# Patient Record
Sex: Male | Born: 1980 | Race: White | Hispanic: No | Marital: Single | Smoking: Current every day smoker
Health system: Southern US, Community
[De-identification: ages and names within clinical notes are randomized; demographics above are authoritative.]

## PROBLEM LIST (undated history)

## (undated) DIAGNOSIS — F1990 Other psychoactive substance use, unspecified, uncomplicated: Secondary | ICD-10-CM

## (undated) DIAGNOSIS — B9562 Methicillin resistant Staphylococcus aureus infection as the cause of diseases classified elsewhere: Secondary | ICD-10-CM

## (undated) DIAGNOSIS — M1990 Unspecified osteoarthritis, unspecified site: Secondary | ICD-10-CM

## (undated) DIAGNOSIS — F1910 Other psychoactive substance abuse, uncomplicated: Secondary | ICD-10-CM

---

## 2015-10-05 ENCOUNTER — Emergency Department (HOSPITAL_COMMUNITY): Payer: No Typology Code available for payment source

## 2015-10-05 ENCOUNTER — Emergency Department (HOSPITAL_COMMUNITY): Payer: Self-pay

## 2015-10-05 ENCOUNTER — Encounter (HOSPITAL_COMMUNITY): Payer: Self-pay | Admitting: Emergency Medicine

## 2015-10-05 ENCOUNTER — Emergency Department (HOSPITAL_COMMUNITY)
Admission: EM | Admit: 2015-10-05 | Discharge: 2015-10-05 | Disposition: A | Discharge status: Home / Self Care | Payer: Self-pay | Attending: Emergency Medicine | Admitting: Emergency Medicine

## 2015-10-05 DIAGNOSIS — F17200 Nicotine dependence, unspecified, uncomplicated: Secondary | ICD-10-CM | POA: Insufficient documentation

## 2015-10-05 DIAGNOSIS — S40811A Abrasion of right upper arm, initial encounter: Secondary | ICD-10-CM | POA: Insufficient documentation

## 2015-10-05 DIAGNOSIS — S134XXA Sprain of ligaments of cervical spine, initial encounter: Secondary | ICD-10-CM

## 2015-10-05 DIAGNOSIS — R109 Unspecified abdominal pain: Secondary | ICD-10-CM | POA: Insufficient documentation

## 2015-10-05 DIAGNOSIS — Z8739 Personal history of other diseases of the musculoskeletal system and connective tissue: Secondary | ICD-10-CM | POA: Insufficient documentation

## 2015-10-05 DIAGNOSIS — S30811A Abrasion of abdominal wall, initial encounter: Secondary | ICD-10-CM | POA: Insufficient documentation

## 2015-10-05 DIAGNOSIS — Z23 Encounter for immunization: Secondary | ICD-10-CM | POA: Insufficient documentation

## 2015-10-05 DIAGNOSIS — Z79899 Other long term (current) drug therapy: Secondary | ICD-10-CM | POA: Insufficient documentation

## 2015-10-05 DIAGNOSIS — S29002A Unspecified injury of muscle and tendon of back wall of thorax, initial encounter: Secondary | ICD-10-CM | POA: Insufficient documentation

## 2015-10-05 DIAGNOSIS — Y92410 Unspecified street and highway as the place of occurrence of the external cause: Secondary | ICD-10-CM | POA: Insufficient documentation

## 2015-10-05 DIAGNOSIS — M542 Cervicalgia: Secondary | ICD-10-CM

## 2015-10-05 DIAGNOSIS — Y998 Other external cause status: Secondary | ICD-10-CM | POA: Insufficient documentation

## 2015-10-05 DIAGNOSIS — S40812A Abrasion of left upper arm, initial encounter: Secondary | ICD-10-CM | POA: Insufficient documentation

## 2015-10-05 DIAGNOSIS — T07XXXA Unspecified multiple injuries, initial encounter: Secondary | ICD-10-CM

## 2015-10-05 DIAGNOSIS — Y9389 Activity, other specified: Secondary | ICD-10-CM | POA: Insufficient documentation

## 2015-10-05 DIAGNOSIS — S0101XA Laceration without foreign body of scalp, initial encounter: Secondary | ICD-10-CM

## 2015-10-05 HISTORY — DX: Unspecified osteoarthritis, unspecified site: M19.90

## 2015-10-05 LAB — PROTIME-INR
INR: 1.2 (ref 0.00–1.49)
Prothrombin Time: 15.4 seconds — ABNORMAL HIGH (ref 11.6–15.2)

## 2015-10-05 LAB — SAMPLE TO BLOOD BANK

## 2015-10-05 LAB — COMPREHENSIVE METABOLIC PANEL
ALK PHOS: 76 U/L (ref 38–126)
ALT: 63 U/L (ref 17–63)
ANION GAP: 12 (ref 5–15)
AST: 59 U/L — ABNORMAL HIGH (ref 15–41)
Albumin: 4.2 g/dL (ref 3.5–5.0)
BILIRUBIN TOTAL: 1.2 mg/dL (ref 0.3–1.2)
BUN: 17 mg/dL (ref 6–20)
CALCIUM: 9.4 mg/dL (ref 8.9–10.3)
CO2: 22 mmol/L (ref 22–32)
Chloride: 102 mmol/L (ref 101–111)
Creatinine, Ser: 1.04 mg/dL (ref 0.61–1.24)
GFR calc non Af Amer: >60 mL/min (ref >60)
Glucose, Bld: 116 mg/dL — ABNORMAL HIGH (ref 65–99)
Potassium: 4.1 mmol/L (ref 3.5–5.1)
SODIUM: 136 mmol/L (ref 135–145)
TOTAL PROTEIN: 7.8 g/dL (ref 6.5–8.1)

## 2015-10-05 LAB — I-STAT CHEM 8, ED
BUN: 22 mg/dL — AB (ref 6–20)
CALCIUM ION: 1.06 mmol/L — AB (ref 1.12–1.23)
CHLORIDE: 101 mmol/L (ref 101–111)
Creatinine, Ser: 1 mg/dL (ref 0.61–1.24)
GLUCOSE: 105 mg/dL — AB (ref 65–99)
HCT: 54 % — ABNORMAL HIGH (ref 39.0–52.0)
Hemoglobin: 18.4 g/dL — ABNORMAL HIGH (ref 13.0–17.0)
Potassium: 3.9 mmol/L (ref 3.5–5.1)
Sodium: 137 mmol/L (ref 135–145)
TCO2: 23 mmol/L (ref 0–100)

## 2015-10-05 LAB — CBC
HCT: 47.4 % (ref 39.0–52.0)
Hemoglobin: 16.5 g/dL (ref 13.0–17.0)
MCH: 32 pg (ref 26.0–34.0)
MCHC: 34.8 g/dL (ref 30.0–36.0)
MCV: 91.9 fL (ref 78.0–100.0)
PLATELETS: 170 10*3/uL (ref 150–400)
RBC: 5.16 MIL/uL (ref 4.22–5.81)
RDW: 13.5 % (ref 11.5–15.5)
WBC: 7.7 10*3/uL (ref 4.0–10.5)

## 2015-10-05 LAB — ETHANOL

## 2015-10-05 LAB — CDS SEROLOGY

## 2015-10-05 LAB — I-STAT CG4 LACTIC ACID, ED: Lactic Acid, Venous: 1.78 mmol/L (ref 0.5–2.0)

## 2015-10-05 MED ORDER — SODIUM CHLORIDE 0.9 % IV SOLN
INTRAVENOUS | Status: AC | PRN
  Administered 2015-10-05 (×2): 1000 mL via INTRAVENOUS

## 2015-10-05 MED ORDER — METHOCARBAMOL 1000 MG/10ML IJ SOLN
500.0000 mg | Freq: Once | INTRAVENOUS | Status: AC
  Administered 2015-10-05: 500 mg via INTRAVENOUS
  Filled 2015-10-05: qty 5

## 2015-10-05 MED ORDER — LORAZEPAM 2 MG/ML IJ SOLN
1.0000 mg | Freq: Once | INTRAMUSCULAR | Status: AC
  Administered 2015-10-05: 1 mg via INTRAVENOUS
  Filled 2015-10-05: qty 1

## 2015-10-05 MED ORDER — SODIUM CHLORIDE 0.9 % IV SOLN
INTRAVENOUS | Status: DC
Start: 2015-10-05 — End: 2015-10-06

## 2015-10-05 MED ORDER — FENTANYL CITRATE (PF) 100 MCG/2ML IJ SOLN
INTRAMUSCULAR | Status: AC
  Administered 2015-10-05: 100 ug
  Filled 2015-10-05: qty 2

## 2015-10-05 MED ORDER — LIDOCAINE-EPINEPHRINE 1 %-1:100000 IJ SOLN: 20.0000 mL | Freq: Once | INTRAMUSCULAR | Status: DC

## 2015-10-05 MED ORDER — HYDROMORPHONE HCL 1 MG/ML IJ SOLN
1.0000 mg | INTRAMUSCULAR | Status: DC | PRN
  Administered 2015-10-05 (×2): 1 mg via INTRAVENOUS
  Filled 2015-10-05 (×2): qty 1

## 2015-10-05 MED ORDER — IOPAMIDOL (ISOVUE-370) INJECTION 76%
INTRAVENOUS | Status: AC
  Filled 2015-10-05: qty 100

## 2015-10-05 MED ORDER — ORPHENADRINE CITRATE 30 MG/ML IJ SOLN: 60.0000 mg | Freq: Two times a day (BID) | INTRAMUSCULAR | Status: DC

## 2015-10-05 MED ORDER — HYDROCODONE-ACETAMINOPHEN 5-325 MG PO TABS: 1.0000 | ORAL_TABLET | Freq: Four times a day (QID) | ORAL | Status: DC | PRN

## 2015-10-05 MED ORDER — SODIUM CHLORIDE 0.9 % IV BOLUS (SEPSIS): 1000.0000 mL | Freq: Once | INTRAVENOUS | Status: DC

## 2015-10-05 MED ORDER — HYDROCODONE-ACETAMINOPHEN 5-325 MG PO TABS
2.0000 | ORAL_TABLET | Freq: Once | ORAL | Status: AC
  Administered 2015-10-05: 2 via ORAL
  Filled 2015-10-05: qty 2

## 2015-10-05 MED ORDER — TETANUS-DIPHTHERIA TOXOIDS TD 5-2 LFU IM INJ
0.5000 mL | INJECTION | Freq: Once | INTRAMUSCULAR | Status: AC
  Administered 2015-10-05: 0.5 mL via INTRAMUSCULAR
  Filled 2015-10-05: qty 0.5

## 2015-10-05 MED ORDER — ONDANSETRON HCL 4 MG/2ML IJ SOLN: 4.0000 mg | Freq: Four times a day (QID) | INTRAMUSCULAR | Status: DC | PRN

## 2015-10-05 MED ORDER — FENTANYL CITRATE (PF) 100 MCG/2ML IJ SOLN: 50.0000 ug | Freq: Once | INTRAMUSCULAR | Status: DC

## 2015-10-05 MED ORDER — SODIUM CHLORIDE 0.9 % IV BOLUS (SEPSIS): 500.0000 mL | Freq: Once | INTRAVENOUS | Status: DC

## 2015-10-05 MED ORDER — IBUPROFEN 600 MG PO TABS: 600.0000 mg | ORAL_TABLET | Freq: Three times a day (TID) | ORAL | Status: DC | PRN

## 2015-10-05 MED ORDER — FENTANYL CITRATE (PF) 100 MCG/2ML IJ SOLN
INTRAMUSCULAR | Status: DC | PRN
  Administered 2015-10-05: 50 ug via INTRAVENOUS

## 2015-10-05 MED ORDER — IOPAMIDOL (ISOVUE-370) INJECTION 76%
INTRAVENOUS | Status: AC
  Filled 2015-10-05: qty 50

## 2015-10-05 MED ORDER — METHOCARBAMOL 500 MG PO TABS: 500.0000 mg | ORAL_TABLET | Freq: Three times a day (TID) | ORAL | Status: DC | PRN

## 2015-10-05 NOTE — ED Provider Notes (Signed)
Patient with cervical ligamentous injury on MRI.  Patient is in an [place] collar this time.  He has no weakness in his arms or legs.  He will follow-up with neurosurgery.  Dr. Franky Machoabbell evaluated the patients MRI. :  [place] collar.  Home with [place] collar for showering.  He understands he will need to be in his collar 24-7.  Home with muscle relaxants, anti-inflammatories, short course of narcotic pain medication.  All questions answered.  Azalia BilisKevin Henri Guedes, MD 10/06/15 [phone]

## 2015-10-05 NOTE — ED Notes (Signed)
Patient transported to MRI 
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
## 2015-10-05 NOTE — ED Notes (Signed)
Returned from ct scan 
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
## 2015-10-05 NOTE — ED Notes (Signed)
Received call from MRI regarding patient. Patient currently refusing to lay for MRI. Per MRI patient wants to stand to void. Confirmed with MD Patria Maneampos that patient is okay to stand in current condition.

## 2015-10-05 NOTE — ED Notes (Signed)
Went to MRI to assist patient with standing and ambulating to restroom. Patient able to stand and ambulate without difficulty. Upon encounter patient obviously anxious and exhibiting tremors in arm and legs. Patient described feeling very uncomfortable when placed on MRI stretcher and wasn't sure he could lay for the test duration. [facility] informed. Ativan ordered and given.
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
## 2015-10-08 ENCOUNTER — Encounter (HOSPITAL_BASED_OUTPATIENT_CLINIC_OR_DEPARTMENT_OTHER): Payer: Self-pay | Admitting: Emergency Medicine

## 2015-10-08 ENCOUNTER — Telehealth: Payer: Self-pay | Admitting: Emergency Medicine
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
## 2018-01-16 NOTE — ED Provider Notes (Signed)
 [facility] HEALTH [facility]  ED Provider Note  Patrick [name] 37 y.o. male DOB: [birth year] MRN: [id number] History   Chief Complaint  Patient presents with   Foot Pain    swelling to R foot    Patrick [name] is a 37 y.o. male who presents to the ER with complaint of pain in right foot for the past several days.  Patient denies any injury to the right foot recently.  Pain is worse with standing and with movement.  He is concerned that he may have an infection in his foot as he recently had an infection in his right knee that was treated with antibiotics and improved.  Denies any associated fevers.  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  He has been using heat and Epsom salts soaks without relief of his symptoms.        Past Medical History:  Diagnosis Date   Asthma     History reviewed. No pertinent surgical history.  Social History   Substance and Sexual Activity  Alcohol Use Yes   Social History   Tobacco Use  Smoking Status Current Every Day Smoker   Types: Cigarettes  Smokeless Tobacco Never Used   E-Cigarettes   E-Cigarette Use     Start Date     Cartridges/Day     Quit Date     Social History   Substance and Sexual Activity  Drug Use No   Tetanus up to date?: Unknown Immunizations Up to Date?: Yes   No Known Allergies  Home Medications   ALBUTEROL SULFATE HFA 108 (90 BASE) MCG/ACT INHALER    Inhale into the lungs.   HYDROCODONE -ACETAMINOPHEN  (NORCO) 5-325 MG PER TABLET    Take one tablet by mouth every 8 (eight) hours as needed for Pain.   IBUPROFEN  (ADVIL ,MOTRIN ) 600 MG TABLET    Take 600 mg by mouth.    Review of Systems   Review of Systems  Constitutional: Negative for chills and fever.  Cardiovascular: Negative for leg swelling.  Gastrointestinal: Negative for nausea and vomiting.  Musculoskeletal: Positive for arthralgias. Negative for joint swelling.  Skin:  Negative for rash and wound.  Neurological: Negative for dizziness, light-headedness, numbness and headaches.  Hematological: Negative for adenopathy. Does not bruise/bleed easily.    Physical Exam   ED Triage Vitals [01/16/18 2025]  BP (!) 154/100  Heart Rate 97  Resp 18  SpO2 100 %  Temp 98.9 F (37.2 C)    Physical Exam  Nursing note and vitals reviewed. Constitutional: He appears well-developed and well-nourished. He is in good hygiene. He has good hygiene. He does not appear distressed, does not appear ill and no respiratory distress. Not diaphoretic. HENT:  Head: Normocephalic and atraumatic.  Eyes: Conjunctivae are normal. Right eye: no conjunctival injection. Left eye: no conjunctival injection.  Neck: Normal range of motion. Neck supple.  Cardiovascular: Normal rate, regular rhythm and intact distal pulses.  Pulmonary/Chest: No respiratory distress. Respiratory effort normal.  Musculoskeletal: Normal range of motion. No obvious deformity noted to extremities. no edema. There is mild tenderness to palpation right midfoot.  There is no appreciable swelling, erythema or sign of infectious process.  No obvious sign of injury.  There is no increased warmth.  Neurovascularly intact distally.  No point tenderness at the ankle or proximally in the lower leg.  There is a healing appearing wound to the right knee without sign of infection.  Neurological: He is alert and oriented to person, place, and time.  Skin: Not diaphoretic.  Psychiatric: He has a normal mood and affect. His behavior is normal. Judgment and thought content normal.    ED Course   Lab results:  No data to display  Imaging:   XR FOOT MIN 3 VIEWS RIGHT   Narrative:    INDICATION: Pain TECHNIQUE: XR FOOT MIN 3 VIEWS RIGHT  COMPARISON:  None.      FINDINGS: - No acute fractures.  - No destructive osseous lesions.  - Joint spaces are preserved.  - No focal soft tissue abnormalities.     Impression:     IMPRESSION:  No acute osseous abnormalities.      Electronically Signed by: Valley Lash   ECG: ECG Results   None     Pre-Sedation Procedures  ED Course as of Jan 16 2234  Franky NOVAK [facility] Documentation  Dju Jan 16, 2018  2234 Discussed results of x-ray with patient.  X-ray negative for fracture or other acute process.  Patient has no objective findings of skin infection on exam.  Patient remains convinced that he has a cellulitis of his foot.  Due to persistent request for antibiotic prescription, he will be given prescription for Keflex.  He also be given prescription for diclofenac with instructions to follow with orthopedics for further evaluation and management.     MDM Coding  Provider Communication  New Prescriptions   CEPHALEXIN (KEFLEX) 500 MG CAPSULE    Take one capsule (500 mg dose) by mouth 3 (three) times a day for 7 days.      Quantity: 21 capsule    Refills: 0   DICLOFENAC SODIUM (VOLTAREN) 75 MG EC TABLET    Take one tablet (75 mg dose) by mouth 2 (two) times daily for 10 days.      Quantity: 20 tablet    Refills: 0    Modified Medications   No medications on file    Discontinued Medications   No medications on file    Clinical Impression   Final diagnoses:  Right foot pain  Strain of right foot, initial encounter    ED Disposition    ED Disposition Comment   Discharge               Follow-up Information    Schedule an appointment as soon as possible for a visit  with Zachary DELENA Locus, MD.   Specialty:  Orthopaedic Surgery Contact information: [address] [place] [address] [phone]        [facility] Emergency Department.   Specialty:  Emergency Medicine Comments:  As needed if symptoms worsen Contact information: [address] [place]  [address] [phone]       [facility] In 1 week.   Specialty:  Internal Medicine Contact  information: [address] [place]  [address] [phone]           Electronically signed by:   Franky NOVAK Finder, PA-C 01/16/18 2235

## 2024-06-07 ENCOUNTER — Emergency Department (HOSPITAL_COMMUNITY)

## 2024-06-07 ENCOUNTER — Inpatient Hospital Stay (HOSPITAL_COMMUNITY)
Admission: EM | Admit: 2024-06-07 | Discharge: 2024-07-10 | DRG: 870 | Disposition: E | Attending: Internal Medicine | Admitting: Internal Medicine

## 2024-06-07 ENCOUNTER — Other Ambulatory Visit: Payer: Self-pay

## 2024-06-07 DIAGNOSIS — J80 Acute respiratory distress syndrome: Secondary | ICD-10-CM | POA: Diagnosis present

## 2024-06-07 DIAGNOSIS — Z66 Do not resuscitate: Secondary | ICD-10-CM | POA: Diagnosis not present

## 2024-06-07 DIAGNOSIS — E861 Hypovolemia: Secondary | ICD-10-CM | POA: Diagnosis present

## 2024-06-07 DIAGNOSIS — I110 Hypertensive heart disease with heart failure: Secondary | ICD-10-CM | POA: Diagnosis present

## 2024-06-07 DIAGNOSIS — A523 Neurosyphilis, unspecified: Secondary | ICD-10-CM

## 2024-06-07 DIAGNOSIS — T40601A Poisoning by unspecified narcotics, accidental (unintentional), initial encounter: Secondary | ICD-10-CM

## 2024-06-07 DIAGNOSIS — I63511 Cerebral infarction due to unspecified occlusion or stenosis of right middle cerebral artery: Secondary | ICD-10-CM | POA: Diagnosis not present

## 2024-06-07 DIAGNOSIS — G9341 Metabolic encephalopathy: Secondary | ICD-10-CM | POA: Diagnosis present

## 2024-06-07 DIAGNOSIS — I351 Nonrheumatic aortic (valve) insufficiency: Secondary | ICD-10-CM | POA: Diagnosis present

## 2024-06-07 DIAGNOSIS — R1310 Dysphagia, unspecified: Secondary | ICD-10-CM | POA: Diagnosis present

## 2024-06-07 DIAGNOSIS — L03116 Cellulitis of left lower limb: Secondary | ICD-10-CM | POA: Diagnosis present

## 2024-06-07 DIAGNOSIS — R29707 NIHSS score 7: Secondary | ICD-10-CM

## 2024-06-07 DIAGNOSIS — I330 Acute and subacute infective endocarditis: Secondary | ICD-10-CM | POA: Diagnosis present

## 2024-06-07 DIAGNOSIS — M1990 Unspecified osteoarthritis, unspecified site: Secondary | ICD-10-CM | POA: Diagnosis present

## 2024-06-07 DIAGNOSIS — K5730 Diverticulosis of large intestine without perforation or abscess without bleeding: Secondary | ICD-10-CM | POA: Diagnosis present

## 2024-06-07 DIAGNOSIS — A4102 Sepsis due to Methicillin resistant Staphylococcus aureus: Principal | ICD-10-CM | POA: Diagnosis present

## 2024-06-07 DIAGNOSIS — G8194 Hemiplegia, unspecified affecting left nondominant side: Secondary | ICD-10-CM | POA: Diagnosis present

## 2024-06-07 DIAGNOSIS — F17210 Nicotine dependence, cigarettes, uncomplicated: Secondary | ICD-10-CM | POA: Diagnosis present

## 2024-06-07 DIAGNOSIS — G8929 Other chronic pain: Secondary | ICD-10-CM | POA: Diagnosis present

## 2024-06-07 DIAGNOSIS — I76 Septic arterial embolism: Secondary | ICD-10-CM | POA: Diagnosis present

## 2024-06-07 DIAGNOSIS — K759 Inflammatory liver disease, unspecified: Secondary | ICD-10-CM | POA: Diagnosis present

## 2024-06-07 DIAGNOSIS — R7881 Bacteremia: Secondary | ICD-10-CM

## 2024-06-07 DIAGNOSIS — T40604A Poisoning by unspecified narcotics, undetermined, initial encounter: Secondary | ICD-10-CM

## 2024-06-07 DIAGNOSIS — N179 Acute kidney failure, unspecified: Secondary | ICD-10-CM | POA: Diagnosis present

## 2024-06-07 DIAGNOSIS — E871 Hypo-osmolality and hyponatremia: Secondary | ICD-10-CM | POA: Diagnosis present

## 2024-06-07 DIAGNOSIS — J15212 Pneumonia due to Methicillin resistant Staphylococcus aureus: Secondary | ICD-10-CM | POA: Diagnosis present

## 2024-06-07 DIAGNOSIS — R54 Age-related physical debility: Secondary | ICD-10-CM | POA: Diagnosis present

## 2024-06-07 DIAGNOSIS — Z6821 Body mass index (BMI) 21.0-21.9, adult: Secondary | ICD-10-CM

## 2024-06-07 DIAGNOSIS — C7951 Secondary malignant neoplasm of bone: Secondary | ICD-10-CM | POA: Diagnosis present

## 2024-06-07 DIAGNOSIS — A528 Late syphilis, latent: Secondary | ICD-10-CM | POA: Diagnosis present

## 2024-06-07 DIAGNOSIS — J189 Pneumonia, unspecified organism: Secondary | ICD-10-CM

## 2024-06-07 DIAGNOSIS — Z1152 Encounter for screening for COVID-19: Secondary | ICD-10-CM

## 2024-06-07 DIAGNOSIS — I618 Other nontraumatic intracerebral hemorrhage: Secondary | ICD-10-CM | POA: Diagnosis present

## 2024-06-07 DIAGNOSIS — B9562 Methicillin resistant Staphylococcus aureus infection as the cause of diseases classified elsewhere: Secondary | ICD-10-CM

## 2024-06-07 DIAGNOSIS — R29724 NIHSS score 24: Secondary | ICD-10-CM | POA: Diagnosis not present

## 2024-06-07 DIAGNOSIS — F1123 Opioid dependence with withdrawal: Secondary | ICD-10-CM | POA: Diagnosis present

## 2024-06-07 DIAGNOSIS — R29706 NIHSS score 6: Secondary | ICD-10-CM | POA: Diagnosis present

## 2024-06-07 DIAGNOSIS — Z515 Encounter for palliative care: Secondary | ICD-10-CM

## 2024-06-07 DIAGNOSIS — E43 Unspecified severe protein-calorie malnutrition: Secondary | ICD-10-CM | POA: Insufficient documentation

## 2024-06-07 DIAGNOSIS — R29714 NIHSS score 14: Secondary | ICD-10-CM | POA: Diagnosis not present

## 2024-06-07 DIAGNOSIS — I639 Cerebral infarction, unspecified: Principal | ICD-10-CM

## 2024-06-07 DIAGNOSIS — N261 Atrophy of kidney (terminal): Secondary | ICD-10-CM | POA: Diagnosis present

## 2024-06-07 DIAGNOSIS — R6521 Severe sepsis with septic shock: Secondary | ICD-10-CM | POA: Diagnosis present

## 2024-06-07 DIAGNOSIS — T40411A Poisoning by fentanyl or fentanyl analogs, accidental (unintentional), initial encounter: Secondary | ICD-10-CM | POA: Diagnosis present

## 2024-06-07 DIAGNOSIS — E785 Hyperlipidemia, unspecified: Secondary | ICD-10-CM | POA: Diagnosis present

## 2024-06-07 DIAGNOSIS — A419 Sepsis, unspecified organism: Secondary | ICD-10-CM | POA: Insufficient documentation

## 2024-06-07 DIAGNOSIS — F1590 Other stimulant use, unspecified, uncomplicated: Secondary | ICD-10-CM | POA: Diagnosis present

## 2024-06-07 DIAGNOSIS — E878 Other disorders of electrolyte and fluid balance, not elsewhere classified: Secondary | ICD-10-CM | POA: Diagnosis present

## 2024-06-07 DIAGNOSIS — R29711 NIHSS score 11: Secondary | ICD-10-CM | POA: Diagnosis not present

## 2024-06-07 DIAGNOSIS — I63442 Cerebral infarction due to embolism of left cerebellar artery: Secondary | ICD-10-CM | POA: Diagnosis present

## 2024-06-07 DIAGNOSIS — Z9911 Dependence on respirator [ventilator] status: Secondary | ICD-10-CM

## 2024-06-07 DIAGNOSIS — R29810 Facial weakness: Secondary | ICD-10-CM | POA: Diagnosis present

## 2024-06-07 DIAGNOSIS — I63411 Cerebral infarction due to embolism of right middle cerebral artery: Secondary | ICD-10-CM | POA: Diagnosis present

## 2024-06-07 DIAGNOSIS — J9601 Acute respiratory failure with hypoxia: Secondary | ICD-10-CM

## 2024-06-07 DIAGNOSIS — R7303 Prediabetes: Secondary | ICD-10-CM | POA: Diagnosis present

## 2024-06-07 DIAGNOSIS — G9340 Encephalopathy, unspecified: Secondary | ICD-10-CM | POA: Diagnosis present

## 2024-06-07 HISTORY — DX: Methicillin resistant Staphylococcus aureus infection as the cause of diseases classified elsewhere: B95.62

## 2024-06-07 HISTORY — DX: Other psychoactive substance abuse, uncomplicated: F19.10

## 2024-06-07 HISTORY — DX: Other psychoactive substance use, unspecified, uncomplicated: F19.90

## 2024-06-07 LAB — I-STAT CHEM 8, ED
BUN: 37 mg/dL — ABNORMAL HIGH (ref 6–20)
Calcium, Ion: 1 mmol/L — ABNORMAL LOW (ref 1.15–1.40)
Chloride: 94 mmol/L — ABNORMAL LOW (ref 98–111)
Creatinine, Ser: 1.1 mg/dL (ref 0.61–1.24)
Glucose, Bld: 139 mg/dL — ABNORMAL HIGH (ref 70–99)
HCT: 48 % (ref 39.0–52.0)
Hemoglobin: 16.3 g/dL (ref 13.0–17.0)
Potassium: 4.1 mmol/L (ref 3.5–5.1)
Sodium: 130 mmol/L — ABNORMAL LOW (ref 135–145)
TCO2: 25 mmol/L (ref 22–32)

## 2024-06-07 LAB — I-STAT VENOUS BLOOD GAS, ED
Acid-Base Excess: 3 mmol/L — ABNORMAL HIGH (ref 0.0–2.0)
Bicarbonate: 26.9 mmol/L (ref 20.0–28.0)
Calcium, Ion: 0.99 mmol/L — ABNORMAL LOW (ref 1.15–1.40)
HCT: 46 % (ref 39.0–52.0)
Hemoglobin: 15.6 g/dL (ref 13.0–17.0)
O2 Saturation: 59 %
Potassium: 4.1 mmol/L (ref 3.5–5.1)
Sodium: 129 mmol/L — ABNORMAL LOW (ref 135–145)
TCO2: 28 mmol/L (ref 22–32)
pCO2, Ven: 36.6 mmHg — ABNORMAL LOW (ref 44–60)
pH, Ven: 7.474 — ABNORMAL HIGH (ref 7.25–7.43)
pO2, Ven: 29 mmHg — CL (ref 32–45)

## 2024-06-07 MED ORDER — SODIUM CHLORIDE 0.9% FLUSH: 3.0000 mL | Freq: Once | INTRAVENOUS | Status: DC

## 2024-06-07 MED ORDER — ASPIRIN 325 MG PO TABS
325.0000 mg | ORAL_TABLET | Freq: Once | ORAL | Status: DC
  Filled 2024-06-07: qty 1

## 2024-06-07 MED ORDER — CLOPIDOGREL BISULFATE 75 MG PO TABS
300.0000 mg | ORAL_TABLET | Freq: Once | ORAL | Status: DC
  Filled 2024-06-07: qty 1
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
## 2024-06-07 NOTE — Code Documentation (Signed)
 Stroke Response Nurse Documentation Code Documentation  Patrick [name] is a 43 y.o. male arriving to [facility]  via [facility] on 12/30 with past medical hx of arthritis. On No antithrombotic. Code stroke was activated by EMS.   Patient from home where he was LKW at 0100 and now complaining of left facial droop and left arm weakness. There was concern for substance abuse on EMS arrival, Narcan 1 mg given intranasal per EMS.   Stroke team at the bedside on patient arrival. Labs drawn and patient cleared for CT by Dr. Lorette. Patient to CT with team. NIHSS 6, see documentation for details and code stroke times. Patient with decreased LOC, left facial droop, left arm weakness, and dysarthria  on exam. The following imaging was completed:  CT Head. Patient is not a candidate for IV Thrombolytic due to Outside of window. Patient is not a candidate for IR due to infarcted area noted on CT.   Care Plan: Neuro checks q2 hrs.    Bedside handoff with ED RN Kathyrn.    Griselda Alm ORN  Rapid Response RN

## 2024-06-07 NOTE — ED Provider Notes (Signed)
 " [place] EMERGENCY DEPARTMENT AT [facility] Provider Note   CSN: [id number] Arrival date & time: 06/07/24  2306  An emergency department physician performed an initial assessment on this suspected stroke patient at 2308.  Patient presents with: Code Stroke   Patrick [name] is a 43 y.o. male.   Intoxicated 43 year old male presents ER today secondary to altered mental status.  Full code stroke called prior to arrival.  Last known well was unknown, possibly 0100 tuesday.  Apparently was unresponsive earlier and they were bagging him.  He got Narcan and his responsiveness is improved.  Initial evaluation here he is very slow to follow commands and may have some facial asymmetry.  Vitals were within normal limits.        Prior to Admission medications  Medication Sig Start Date End Date Taking? Authorizing Provider  acetaminophen  (TYLENOL ) 500 MG tablet Take 1,000 mg by mouth every 6 (six) hours as needed for moderate pain.    [provider]  albuterol (PROVENTIL HFA;VENTOLIN HFA) 108 (90 Base) MCG/ACT inhaler Inhale 2 puffs into the lungs every 6 (six) hours as needed for wheezing or shortness of breath.    [provider]  HYDROcodone -acetaminophen  (NORCO/VICODIN) 5-325 MG tablet Take 1 tablet by mouth every 6 (six) hours as needed for moderate pain. 10/05/15   Baxter Drivers, MD  ibuprofen  (ADVIL ,MOTRIN ) 600 MG tablet Take 1 tablet (600 mg total) by mouth every 8 (eight) hours as needed. 10/05/15   Baxter Drivers, MD  methocarbamol  (ROBAXIN ) 500 MG tablet Take 1 tablet (500 mg total) by mouth every 8 (eight) hours as needed for muscle spasms. 10/05/15   Baxter Drivers, MD    Allergies: Patient has no known allergies.    Review of Systems  Updated Vital Signs BP (!) 149/100   Pulse (!) 129   Temp 98.7 F (37.1 C) (Oral)   Resp (!) 23   Ht 5' 11 (1.803 m)   Wt 79.2 kg   SpO2 99%   BMI 24.35 kg/m   Physical Exam Vitals and nursing note reviewed.   Constitutional:      Appearance: He is well-developed.  HENT:     Head: Normocephalic and atraumatic.  Cardiovascular:     Rate and Rhythm: Normal rate.  Pulmonary:     Effort: Pulmonary effort is normal. No respiratory distress.  Abdominal:     General: There is no distension.  Musculoskeletal:        General: Normal range of motion.     Cervical back: Normal range of motion.  Skin:    General: Skin is warm and dry.     Findings: Erythema (Around bilateral ankles) present.  Neurological:     Mental Status: He is alert.     Comments: R facial droop, sleepy, slow to answer questions     (all labs ordered are listed, but only abnormal results are displayed) Labs Reviewed  PROTIME-INR - Abnormal; Notable for the following components:      Result Value   Prothrombin Time 17.1 (*)    INR 1.3 (*)    All other components within normal limits  APTT - Abnormal; Notable for the following components:   aPTT 38 (*)    All other components within normal limits  CBC - Abnormal; Notable for the following components:   WBC 20.1 (*)    Platelets 108 (*)    All other components within normal limits  DIFFERENTIAL - Abnormal; Notable for the following components:  Neutro Abs 16.9 (*)    Monocytes Absolute 1.7 (*)    Abs Immature Granulocytes 0.34 (*)    All other components within normal limits  COMPREHENSIVE METABOLIC PANEL WITH GFR - Abnormal; Notable for the following components:   Sodium 128 (*)    Chloride 91 (*)    Glucose, Bld 143 (*)    BUN 36 (*)    Albumin 3.3 (*)    AST 144 (*)    ALT 71 (*)    Alkaline Phosphatase 181 (*)    All other components within normal limits  URINE DRUG SCREEN - Abnormal; Notable for the following components:   Amphetamines POSITIVE (*)    Fentanyl  POSITIVE (*)    All other components within normal limits  URINALYSIS, W/ REFLEX TO CULTURE (INFECTION SUSPECTED) - Abnormal; Notable for the following components:   Specific Gravity, Urine 1.043  (*)    Hgb urine dipstick SMALL (*)    Protein, ur 100 (*)    All other components within normal limits  I-STAT CHEM 8, ED - Abnormal; Notable for the following components:   Sodium 130 (*)    Chloride 94 (*)    BUN 37 (*)    Glucose, Bld 139 (*)    Calcium, Ion 1.00 (*)    All other components within normal limits  CBG MONITORING, ED - Abnormal; Notable for the following components:   Glucose-Capillary 161 (*)    All other components within normal limits  I-STAT VENOUS BLOOD GAS, ED - Abnormal; Notable for the following components:   pH, Ven 7.474 (*)    pCO2, Ven 36.6 (*)    pO2, Ven 29 (*)    Acid-Base Excess 3.0 (*)    Sodium 129 (*)    Calcium, Ion 0.99 (*)    All other components within normal limits  I-STAT CG4 LACTIC ACID, ED - Abnormal; Notable for the following components:   Lactic Acid, Venous 2.5 (*)    All other components within normal limits  I-STAT CG4 LACTIC ACID, ED - Abnormal; Notable for the following components:   Lactic Acid, Venous 2.6 (*)    All other components within normal limits  CULTURE, BLOOD (ROUTINE X 2)  CULTURE, BLOOD (ROUTINE X 2)  RESP PANEL BY RT-PCR (RSV, FLU A&B, COVID)  RVPGX2  ETHANOL  AMMONIA  GAMMA GT    EKG: None  Radiology: CT CHEST ABDOMEN PELVIS WO CONTRAST Result Date: 06/08/2024 EXAM: CT CHEST, ABDOMEN AND PELVIS WITHOUT CONTRAST 06/08/2024 02:52:24 AM TECHNIQUE: CT of the chest, abdomen and pelvis was performed without the administration of intravenous contrast. Multiplanar reformatted images are provided for review. Automated exposure control, iterative reconstruction, and/or weight based adjustment of the mA/kV was utilized to reduce the radiation dose to as low as reasonably achievable. COMPARISON: Comparison is made to prior examination of 06/26/2020. CLINICAL HISTORY: Sepsis. FINDINGS: CHEST: MEDIASTINUM AND LYMPH NODES: Heart and pericardium are unremarkable. Mild coronary artery calcification. The central airways are  clear. No mediastinal, hilar or axillary lymphadenopathy. LUNGS AND PLEURA: Diffuse bronchial wall thickening noted in keeping with airway inflammation. Right posterior basal pulmonary consolidation suspicious for acute lobar pneumonia in the appropriate clinical setting. Additional pleural based areas of reverse consolidation are identified within the peripheral right upper lobe and right middle lobe which can be seen in the setting of acute infection including atypical infection such as invasive fungal infection of the immunocompromised individual, septic embolization, or resolving pulmonary infarcts. In the acute setting, however, this is  still most likely altogether reflective of multifocal bronchopneumonia. Correlation with the patient's immunocompetency status is warranted, however. Moderate airway impaction noted within the posterior base of segmental bronchi of the right lower lobe. Subpleural pulmonary nodule within the left lower lobe is stable since remote prior examination and is safely considered benign. Additional pulmonary nodules within the right upper lobe measuring up to 5 mm. Noncalcified pulmonary nodule within the left upper lobe (series 67, image 4) is indeterminate. Per Fleischner Society Guidelines recommend a non-contrast chest CT at 3-6 months, then consider another non-contrast chest CT at 18-24 months. If patient is low risk for malignancy, non-contrast chest CT at 18-24 months is optional. No pulmonary edema. No pleural effusion. No pneumothorax. ABDOMEN AND PELVIS: LIVER: Hepatomegaly, liver measuring 23 cm in craniocaudal dimension, appears new from prior examination. GALLBLADDER AND BILE DUCTS: Unremarkable. No biliary ductal dilatation. SPLEEN: Splenomegaly, spleen measuring 50.4 cm in greatest dimension, appears new from prior examination. PANCREAS: No acute abnormality. ADRENAL GLANDS: No acute abnormality. KIDNEYS, URETERS AND BLADDER: Marked atrophy of the right kidney and  compensatory hypertrophy of the left kidney are again noted. Residual contrast from recent contrast enhanced examination is seen within the renal collecting system. The urinary bladder is moderately distended but is otherwise unremarkable. No stones in the kidneys or ureters. No hydronephrosis. No perinephric or periureteral stranding. GI AND BOWEL: Stomach demonstrates no acute abnormality. Moderate sigmoid diverticulosis. No superimposed acute inflammatory change. The small bowel and large bowel are otherwise unremarkable. There is no bowel obstruction. REPRODUCTIVE ORGANS: Moderate right scrotal hydrocele was partially visualized. PERITONEUM AND RETROPERITONEUM: No ascites. No free air. VASCULATURE: Aorta is normal in caliber. Mild aortoiliac atherosclerotic calcification. No aortic aneurysm. ABDOMINAL AND PELVIS LYMPH NODES: No lymphadenopathy. BONES AND SOFT TISSUES: No acute osseous abnormality. No focal soft tissue abnormality. IMPRESSION: 1. Right lower lobe posterior basal consolidation and additional right upper and middle lobe peripheral consolidations, most consistent with multifocal pneumonia. Additional considerations regarding the scattered areas of peripheral consolidation within the right upper and right middle lobe are as described above, and correlation with the patient's immunocompetency status is warranted. 2. Diffuse bronchial wall thickening with moderate mucus impaction in right lower lobe segmental bronchi, consistent with airway inflammation. 3. Hepatosplenomegaly, new from prior examination. 4. Indeterminate noncalcified left upper lobe pulmonary nodule, with recommendation for non-contrast chest CT at 3-6 months and then consideration of non-contrast chest CT at 18-24 months as per Fleischner Society Guidelines. 5. Marked atrophy of the right kidney with compensatory hypertrophy of the left kidney. 6. Moderate sigmoid diverticulosis without acute inflammatory change. 7. Mild coronary  artery calcification and mild aortoiliac atherosclerotic calcification. 8. Partially visualized moderate right scrotal hydrocele. Electronically signed by: Dorethia Molt MD 06/08/2024 03:46 AM EST RP Workstation: [id number]   DG Chest Portable 1 View Result Date: 06/08/2024 EXAM: 1 VIEW(S) XRAY OF THE CHEST 06/07/2024 11:44:39 PM COMPARISON: None available. CLINICAL HISTORY: eval for AMS FINDINGS: LUNGS AND PLEURA: Hypoinflated lungs. Bibasilar opacities, likely atelectasis. No pleural effusion. No pneumothorax. HEART AND MEDIASTINUM: No acute abnormality of the cardiac and mediastinal silhouettes. BONES AND SOFT TISSUES: No acute osseous abnormality. IMPRESSION: 1. Hypoinflated lungs with bibasilar opacities, likely atelectasis. Electronically signed by: Franky Stanford MD 06/08/2024 01:00 AM EST RP Workstation: [id number]   CT ANGIO HEAD NECK W WO CM Result Date: 06/08/2024 EXAM: CTA HEAD AND NECK WITHOUT AND WITH 06/08/2024 12:39:44 AM TECHNIQUE: CTA of the head and neck was performed without and with the administration of 75 mL of intravenous  iohexol (OMNIPAQUE) 350 MG/ML injection. Multiplanar 2D and/or 3D reformatted images are provided for review. Automated exposure control, iterative reconstruction, and/or weight based adjustment of the mA/kV was utilized to reduce the radiation dose to as low as reasonably achievable. Stenosis of the internal carotid arteries measured using NASCET criteria. COMPARISON: None available CLINICAL HISTORY: Neuro deficit, acute, stroke suspected. FINDINGS: CTA NECK: AORTIC ARCH AND ARCH VESSELS: No dissection or arterial injury. No significant stenosis of the brachiocephalic or subclavian arteries. CERVICAL CAROTID ARTERIES: Atherosclerosis at the carotid bifurcations without hemodynamically significant stenosis. No dissection or arterial injury. CERVICAL VERTEBRAL ARTERIES: No dissection, arterial injury, or significant stenosis. LUNGS AND MEDIASTINUM: Unremarkable. SOFT  TISSUES: No acute abnormality. BONES: No acute abnormality. CTA HEAD: ANTERIOR CIRCULATION: No significant stenosis of the internal carotid arteries. No significant stenosis of the anterior cerebral arteries. No significant stenosis of the middle cerebral arteries. No aneurysm. POSTERIOR CIRCULATION: Fetal predominant origins of both posterior cerebral arteries. No significant stenosis of the posterior cerebral arteries. No significant stenosis of the basilar artery. No significant stenosis of the vertebral arteries. No aneurysm. OTHER: No dural venous sinus thrombosis on this non-dedicated study. Unchanged appearance of hypoattenuating ischemic region in the right MCA territory. IMPRESSION: 1. No large vessel occlusion, hemodynamically significant stenosis, or aneurysm in the head or neck. 2. Atherosclerosis at the carotid bifurcations without hemodynamically significant stenosis of the internal carotid arteries. Electronically signed by: Franky Stanford MD 06/08/2024 12:57 AM EST RP Workstation: [id number]   CT HEAD CODE STROKE WO CONTRAST Result Date: 06/07/2024 EXAM: CT HEAD WITHOUT CONTRAST 06/07/2024 11:16:45 PM TECHNIQUE: CT of the head was performed without the administration of intravenous contrast. Automated exposure control, iterative reconstruction, and/or weight based adjustment of the mA/kV was utilized to reduce the radiation dose to as low as reasonably achievable. COMPARISON: None available. CLINICAL HISTORY: Neuro deficit, acute, stroke suspected. FINDINGS: BRAIN AND VENTRICLES: No acute hemorrhage. There is an intermediate-sized area of hypoattenuation within the right MCA territory affecting the insula and m2/m5 cortex. [place] Stroke Program Early CT Score (ASPECTS): Ganglionic (caudate, ic, lentiform nucleus, insula, M1-m3): 5. Supraganglionic (m4-m6): 2. Total: 8. No hydrocephalus. No extra-axial collection. No mass effect or midline shift. ORBITS: No acute abnormality. SINUSES: No acute  abnormality. SOFT TISSUES AND SKULL: No acute soft tissue abnormality. No skull fracture. IMPRESSION: 1. Intermediate-sized area of hypoattenuation within the right MCA territory affecting the insula and M2/M5 cortex, consistent with acute ischemic changes. 2. ASPECTS score is 7. Findings communicated to Dr. Salman Khaliqdina at 11:23 PM on 06/07/2024. Electronically signed by: Franky Stanford MD 06/07/2024 11:24 PM EST RP Workstation: [id number]     .Critical Care  Performed by: Lorette Mayo, MD Authorized by: Lorette Mayo, MD   Critical care provider statement:    Critical care time (minutes):  78   Critical care was necessary to treat or prevent imminent or life-threatening deterioration of the following conditions:  Sepsis, CNS failure or compromise and toxidrome   Critical care was time spent personally by me on the following activities:  Development of treatment plan with patient or surrogate, discussions with consultants, evaluation of patient's response to treatment, examination of patient, ordering and review of laboratory studies, ordering and review of radiographic studies, ordering and performing treatments and interventions, pulse oximetry, re-evaluation of patient's condition and review of old charts    Medications Ordered in the ED  sodium chloride  flush (NS) 0.9 % injection 3 mL (3 mLs Intravenous Not Given 06/07/24 2334)  aspirin tablet 325 mg (0  mg Oral Hold 06/07/24 2358)  clopidogrel (PLAVIX) tablet 300 mg (0 mg Oral Hold 06/07/24 2359)  iohexol (OMNIPAQUE) 350 MG/ML injection 75 mL (75 mLs Intravenous Contrast Given 06/08/24 0032)  sodium chloride  0.9 % bolus 1,000 mL (0 mLs Intravenous Stopped 06/08/24 0316)  vancomycin (VANCOREADY) IVPB 1500 mg/300 mL (0 mg Intravenous Stopped 06/08/24 0354)  ceFEPIme (MAXIPIME) 2 g in sodium chloride  0.9 % 100 mL IVPB (0 g Intravenous Stopped 06/08/24 0148)  sodium chloride  0.9 % bolus 1,000 mL (0 mLs Intravenous Stopped 06/08/24 0516)   naloxone (NARCAN) injection 0.4 mg (0.4 mg Intravenous Given 06/08/24 0335)                                    Medical Decision Making Amount and/or Complexity of Data Reviewed Labs: ordered. Radiology: ordered.  Risk OTC drugs. Prescription drug management. Decision regarding hospitalization.  CT viewed and interpreted by myself as R infarct. Neuro thinks likely subacute. Will await labs and admit to medicine. ASA and Plavix. Patient with a pretty significant leukocytosis so rectal temperature taken and found to be febrile.  Sepsis initiated unknown antibiotics provided.  Patient states he has been coughing but it has been nonproductive.  He has felt unwell but mostly just nausea.  Chest x-ray was overall reassuring.  His labs are otherwise relatively reassuring with a mild bili elevated lactic acid.  CT chest abdomen pelvis performed to evaluate for other areas of sepsis and found to have multifocal pneumonia.  Mental status slowly [place] began to where he was barely moving to voice.  He can see him trying to open his eyes but would not talk to you that he was earlier.  Another dose Narcan was given and he was alert and oriented once again.  Discussed with intensivist, Dr. Layman evaluated patient did not feel like ICU was warranted at this time.  Will discuss with hospitalist for admission to the likely stepdown. Notified by nursing that patient's heart rate and respiratory rate were higher and his blood pressure was low but higher.  This seems to coincide with after he got the Narcan I suspect this is Narcan induced withdrawal rather than actual change in status.  Although he is more awake now he may be able to take his oral Tylenol  as he still may have a temperature we will recheck it rectally. Discussed with Dr. Marcene for admission.   Final diagnoses:  Acute ischemic stroke (HCC)  Opiate overdose, undetermined intent, initial encounter (HCC)  Pneumonia of both lungs due to  infectious organism, unspecified part of lung  Sepsis, due to unspecified organism, unspecified whether acute organ dysfunction present [facility])    ED Discharge Orders     None          Arron Tetrault, Selinda, MD 06/08/24 [id number]  "

## 2024-06-07 NOTE — Consult Note (Signed)
 NEUROLOGY CONSULT NOTE   Date of service: June 07, 2024 Patient Name: Patrick [name] MRN:  [id number] DOB:  Jan 20, 1981 Chief Complaint: L facial droop, L arm weakness, slurred speech Requesting Provider: Lorette Mayo, MD  History of Present Illness  Patrick [name] is a 43 y.o. male with hx of arthritis, who is brought in as a code stroke for L facial droop, L arm weakness.  Friend called 911 due to confusion. When EMS initially arrived, patient was responsive but noted to be weak in L arm with L facial droop and slurred speech. He was activated as a code stroke. There was concern for substance use. Enroute, patient poorly responsive and briefly bagged. He was given 1mg  of Narcan. He is more awake on arrival to the ED.  He is drowsy but able to follow commands and answer questions. Still has persistent L sided weakness.  Despite multiple attempts, pt is unable to give a clear last seen normal. Reports been feeling terrible for the last 5 days. Per EMS, he went to [facility] center last night and was discharged home.  LKW: 0100 on 06/07/24 Modified rankin score: unclear IV Thrombolysis: not offered, outside window EVT: not offered, infarcted area noted on CT head appears as a clear hypodensity and most consistent with irreversibly damaged tissue at this point. He will therefore, not benefit from intervention.  NIHSS components Score: Comment  1a Level of Conscious 0[]  1[x]  2[]  3[]      1b LOC Questions 0[x]  1[]  2[]       1c LOC Commands 0[x]  1[]  2[]       2 Best Gaze 0[x]  1[]  2[]       3 Visual 0[x]  1[]  2[]  3[]      4 Facial Palsy 0[]  1[]  2[x]  3[]      5a Motor Arm - left 0[]  1[x]  2[]  3[]  4[]  UN[]    5b Motor Arm - Right 0[x]  1[]  2[]  3[]  4[]  UN[]    6a Motor Leg - Left 0[]  1[x]  2[]  3[]  4[]  UN[]    6b Motor Leg - Right 0[x]  1[]  2[]  3[]  4[]  UN[]    7 Limb Ataxia 0[x]  1[]  2[]  UN[]      8 Sensory 0[x]  1[]  2[]  UN[]      9 Best Language 0[x]  1[]  2[]  3[]      10 Dysarthria 0[]  1[]   2[x]  UN[]      11 Extinct. and Inattention 0[x]  1[]  2[]       TOTAL: 7      ROS  Comprehensive ROS performed and pertinent positives documented in HPI   Past History   Past Medical History:  Diagnosis Date   Arthritis     No past surgical history on file.  Family History: No family history on file.  Social History  reports that he has been smoking. He does not have any smokeless tobacco history on file. He reports current alcohol use. He reports that he does not use drugs.  Allergies[1]  Medications  Current Medications[2]  Vitals   Vitals:   06/07/24 2300  Weight: 79.2 kg    There is no height or weight on file to calculate BMI.   Physical Exam   General: Laying comfortably in bed; in no acute distress.  HENT: Normal oropharynx and mucosa. Poor dentition. Normal external appearance of ears and nose.  Neck: Supple, no pain or tenderness  CV: No JVD. No peripheral edema.  Pulmonary: Symmetric Chest rise. Normal respiratory effort.  Abdomen: Soft to touch, non-tender.  Ext: No cyanosis, edema, or deformity  Skin: No rash.  Normal palpation of skin.   Musculoskeletal: Normal digits and nails by inspection. No clubbing.   Neurologic Examination  Mental status/Cognition: drowsy/somnolent, oriented to self, place, month and year, good attention.  Speech/language: severely dysarthric speech, barely undesrtandable. Fluent, comprehension intact, object naming intact. Cranial nerves:   CN II Pupils equal and reactive to light, no VF deficits    CN III,IV,VI EOM intact, no gaze preference or deviation, no nystagmus    CN V normal sensation in V1, V2, and V3 segments bilaterally    CN VII L facial droop   CN VIII normal hearing to speech    CN IX & X normal palatal elevation, no uvular deviation    CN XI 5/5 head turn and 5/5 shoulder shrug bilaterally    CN XII midline tongue protrusion    Motor:  Muscle bulk: normal, tone normal. LUE drifts when held up off the  bed. RUE: 5/5 RLE: 4+/5 LLE: slight drift when held up off the bed  Sensation:  Light touch Intact throughout   Pin prick    Temperature    Vibration   Proprioception    Coordination/Complex Motor:  - Finger to Nose intact BL - Heel to shin unable to get him to do - Rapid alternating movement are slowed on the right, unable to do with L hand - Gait: deferred.  Labs/Imaging/Neurodiagnostic studies   CBC: No results for input(s): WBC, NEUTROABS, HGB, HCT, MCV, PLT in the last 168 hours. Basic Metabolic Panel:  Lab Results  Component Value Date   NA 137 10/05/2015   K 3.9 10/05/2015   CO2 22 10/05/2015   GLUCOSE 105 (H) 10/05/2015   BUN 22 (H) 10/05/2015   CREATININE 1.00 10/05/2015   CALCIUM 9.4 10/05/2015   GFRNONAA >60 10/05/2015   GFRAA >60 10/05/2015   Lipid Panel: No results found for: LDLCALC HgbA1c: No results found for: HGBA1C Urine Drug Screen: No results found for: LABOPIA, COCAINSCRNUR, LABBENZ, AMPHETMU, THCU, LABBARB  Alcohol Level     Component Value Date/Time   ETH <5 10/05/2015 1410   INR  Lab Results  Component Value Date   INR 1.20 10/05/2015   APTT No results found for: APTT AED levels: No results found for: PHENYTOIN, ZONISAMIDE, LAMOTRIGINE, LEVETIRACETA  CT Head without contrast(Personally reviewed): Aute R MCA stroke with ASPECTS of 7.  CT angio Head and Neck with contrast(Personally reviewed): Pending  MRI Brain(Personally reviewed): pending ASSESSMENT   Patrick [name] is a 43 y.o. male who is brought in as a code stroke for L facial droop, L arm weakness. There is concern for substance use. Patient poorly responsive enroute and being bagged but woke up with Narcan 1mg .  Neuro exam with drowsiness, L facial droop and L arm weakness and L leg weakness.  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 He was outside tnkase window and not offered thrombectomy due to infarcted tissue appearance  on CT being suggestive of irreversibly damaged tissue.  RECOMMENDATIONS  - Frequent Neuro checks per stroke unit protocol - Recommend brain imaging with MRI Brain without contrast - Recommend Vascular imaging with CTA head and neck - Recommend obtaining TTE - Recommend obtaining Lipid panel with LDL - Please start statin if LDL > 70 - Recommend HbA1c to evaluate for diabetes and how well it is controlled. - Antithrombotic - Aspirin 325mg  once, along with plavix 300mg  once, followed by Aspirin 81mg  daily and plavix 75mg  daily x 21 days, followed by Aspirin 81mg  daily alone. -  Recommend DVT ppx - SBP goal - permissive hypertension first 24 h < 220/110. Held home meds.  - Recommend Telemetry monitoring for arrythmia - Recommend bedside swallow screen prior to PO intake. - Stroke education booklet - Recommend PT/OT/SLP consult - Recommend Urine Tox screen.  ______________________________________________________________________  Plan discussed with Dr. Lorette with the ED team.  Signed, Nannette Zill, MD [facility]     [1] No Known Allergies [2]  Current Facility-Administered Medications:    sodium chloride  flush (NS) 0.9 % injection 3 mL, 3 mL, Intravenous, Once, Mesner, Selinda, MD  Current Outpatient Medications:    acetaminophen  (TYLENOL ) 500 MG tablet, Take 1,000 mg by mouth every 6 (six) hours as needed for moderate pain., Disp: , Rfl:    albuterol (PROVENTIL HFA;VENTOLIN HFA) 108 (90 Base) MCG/ACT inhaler, Inhale 2 puffs into the lungs every 6 (six) hours as needed for wheezing or shortness of breath., Disp: , Rfl:    HYDROcodone -acetaminophen  (NORCO/VICODIN) 5-325 MG tablet, Take 1 tablet by mouth every 6 (six) hours as needed for moderate pain., Disp: 25 tablet, Rfl: 0   ibuprofen  (ADVIL ,MOTRIN ) 600 MG tablet, Take 1 tablet (600 mg total) by mouth every 8 (eight) hours as needed., Disp: 15 tablet, Rfl: 0   methocarbamol  (ROBAXIN ) 500 MG tablet, Take 1 tablet  (500 mg total) by mouth every 8 (eight) hours as needed for muscle spasms., Disp: 12 tablet, Rfl: 0

## 2024-06-08 ENCOUNTER — Emergency Department (HOSPITAL_COMMUNITY)

## 2024-06-08 ENCOUNTER — Inpatient Hospital Stay (HOSPITAL_COMMUNITY)

## 2024-06-08 DIAGNOSIS — C7951 Secondary malignant neoplasm of bone: Secondary | ICD-10-CM | POA: Diagnosis present

## 2024-06-08 DIAGNOSIS — F1110 Opioid abuse, uncomplicated: Secondary | ICD-10-CM

## 2024-06-08 DIAGNOSIS — I330 Acute and subacute infective endocarditis: Secondary | ICD-10-CM | POA: Diagnosis present

## 2024-06-08 DIAGNOSIS — I76 Septic arterial embolism: Secondary | ICD-10-CM | POA: Diagnosis present

## 2024-06-08 DIAGNOSIS — J189 Pneumonia, unspecified organism: Secondary | ICD-10-CM | POA: Diagnosis not present

## 2024-06-08 DIAGNOSIS — F1911 Other psychoactive substance abuse, in remission: Secondary | ICD-10-CM | POA: Diagnosis not present

## 2024-06-08 DIAGNOSIS — A4102 Sepsis due to Methicillin resistant Staphylococcus aureus: Secondary | ICD-10-CM | POA: Diagnosis present

## 2024-06-08 DIAGNOSIS — R29724 NIHSS score 24: Secondary | ICD-10-CM | POA: Diagnosis not present

## 2024-06-08 DIAGNOSIS — J80 Acute respiratory distress syndrome: Secondary | ICD-10-CM | POA: Diagnosis present

## 2024-06-08 DIAGNOSIS — R29711 NIHSS score 11: Secondary | ICD-10-CM | POA: Diagnosis not present

## 2024-06-08 DIAGNOSIS — I618 Other nontraumatic intracerebral hemorrhage: Secondary | ICD-10-CM | POA: Diagnosis present

## 2024-06-08 DIAGNOSIS — I340 Nonrheumatic mitral (valve) insufficiency: Secondary | ICD-10-CM | POA: Diagnosis not present

## 2024-06-08 DIAGNOSIS — R7303 Prediabetes: Secondary | ICD-10-CM | POA: Diagnosis not present

## 2024-06-08 DIAGNOSIS — F1991 Other psychoactive substance use, unspecified, in remission: Secondary | ICD-10-CM | POA: Diagnosis not present

## 2024-06-08 DIAGNOSIS — R29706 NIHSS score 6: Secondary | ICD-10-CM | POA: Diagnosis present

## 2024-06-08 DIAGNOSIS — I38 Endocarditis, valve unspecified: Secondary | ICD-10-CM | POA: Diagnosis not present

## 2024-06-08 DIAGNOSIS — I358 Other nonrheumatic aortic valve disorders: Secondary | ICD-10-CM | POA: Diagnosis not present

## 2024-06-08 DIAGNOSIS — I639 Cerebral infarction, unspecified: Secondary | ICD-10-CM | POA: Diagnosis present

## 2024-06-08 DIAGNOSIS — I63442 Cerebral infarction due to embolism of left cerebellar artery: Secondary | ICD-10-CM | POA: Diagnosis present

## 2024-06-08 DIAGNOSIS — G9340 Encephalopathy, unspecified: Secondary | ICD-10-CM | POA: Diagnosis not present

## 2024-06-08 DIAGNOSIS — R7881 Bacteremia: Secondary | ICD-10-CM | POA: Diagnosis not present

## 2024-06-08 DIAGNOSIS — F1510 Other stimulant abuse, uncomplicated: Secondary | ICD-10-CM | POA: Diagnosis not present

## 2024-06-08 DIAGNOSIS — G9341 Metabolic encephalopathy: Secondary | ICD-10-CM | POA: Diagnosis present

## 2024-06-08 DIAGNOSIS — R1310 Dysphagia, unspecified: Secondary | ICD-10-CM | POA: Diagnosis present

## 2024-06-08 DIAGNOSIS — R570 Cardiogenic shock: Secondary | ICD-10-CM | POA: Diagnosis not present

## 2024-06-08 DIAGNOSIS — J15212 Pneumonia due to Methicillin resistant Staphylococcus aureus: Secondary | ICD-10-CM | POA: Diagnosis present

## 2024-06-08 DIAGNOSIS — R7989 Other specified abnormal findings of blood chemistry: Secondary | ICD-10-CM | POA: Diagnosis not present

## 2024-06-08 DIAGNOSIS — I69391 Dysphagia following cerebral infarction: Secondary | ICD-10-CM | POA: Diagnosis not present

## 2024-06-08 DIAGNOSIS — I10 Essential (primary) hypertension: Secondary | ICD-10-CM | POA: Diagnosis not present

## 2024-06-08 DIAGNOSIS — Z66 Do not resuscitate: Secondary | ICD-10-CM | POA: Diagnosis not present

## 2024-06-08 DIAGNOSIS — Z1152 Encounter for screening for COVID-19: Secondary | ICD-10-CM | POA: Diagnosis not present

## 2024-06-08 DIAGNOSIS — I351 Nonrheumatic aortic (valve) insufficiency: Secondary | ICD-10-CM | POA: Diagnosis not present

## 2024-06-08 DIAGNOSIS — I63411 Cerebral infarction due to embolism of right middle cerebral artery: Secondary | ICD-10-CM | POA: Diagnosis present

## 2024-06-08 DIAGNOSIS — J9601 Acute respiratory failure with hypoxia: Secondary | ICD-10-CM | POA: Diagnosis not present

## 2024-06-08 DIAGNOSIS — N179 Acute kidney failure, unspecified: Secondary | ICD-10-CM | POA: Diagnosis present

## 2024-06-08 DIAGNOSIS — Z515 Encounter for palliative care: Secondary | ICD-10-CM | POA: Diagnosis not present

## 2024-06-08 DIAGNOSIS — R578 Other shock: Secondary | ICD-10-CM | POA: Diagnosis not present

## 2024-06-08 DIAGNOSIS — I6389 Other cerebral infarction: Secondary | ICD-10-CM | POA: Diagnosis not present

## 2024-06-08 DIAGNOSIS — F1123 Opioid dependence with withdrawal: Secondary | ICD-10-CM | POA: Diagnosis present

## 2024-06-08 DIAGNOSIS — I6381 Other cerebral infarction due to occlusion or stenosis of small artery: Secondary | ICD-10-CM | POA: Diagnosis not present

## 2024-06-08 DIAGNOSIS — Z9911 Dependence on respirator [ventilator] status: Secondary | ICD-10-CM | POA: Diagnosis not present

## 2024-06-08 DIAGNOSIS — T40604A Poisoning by unspecified narcotics, undetermined, initial encounter: Secondary | ICD-10-CM | POA: Diagnosis not present

## 2024-06-08 DIAGNOSIS — L03116 Cellulitis of left lower limb: Secondary | ICD-10-CM | POA: Diagnosis present

## 2024-06-08 DIAGNOSIS — B9562 Methicillin resistant Staphylococcus aureus infection as the cause of diseases classified elsewhere: Secondary | ICD-10-CM

## 2024-06-08 DIAGNOSIS — R29710 NIHSS score 10: Secondary | ICD-10-CM | POA: Diagnosis not present

## 2024-06-08 DIAGNOSIS — I361 Nonrheumatic tricuspid (valve) insufficiency: Secondary | ICD-10-CM | POA: Diagnosis not present

## 2024-06-08 DIAGNOSIS — A539 Syphilis, unspecified: Secondary | ICD-10-CM | POA: Diagnosis not present

## 2024-06-08 DIAGNOSIS — R29714 NIHSS score 14: Secondary | ICD-10-CM | POA: Diagnosis not present

## 2024-06-08 DIAGNOSIS — F17210 Nicotine dependence, cigarettes, uncomplicated: Secondary | ICD-10-CM | POA: Diagnosis not present

## 2024-06-08 DIAGNOSIS — E43 Unspecified severe protein-calorie malnutrition: Secondary | ICD-10-CM | POA: Diagnosis present

## 2024-06-08 DIAGNOSIS — A523 Neurosyphilis, unspecified: Secondary | ICD-10-CM | POA: Diagnosis not present

## 2024-06-08 DIAGNOSIS — A419 Sepsis, unspecified organism: Secondary | ICD-10-CM | POA: Diagnosis not present

## 2024-06-08 DIAGNOSIS — R6521 Severe sepsis with septic shock: Secondary | ICD-10-CM | POA: Diagnosis present

## 2024-06-08 LAB — HEMOGLOBIN A1C
Hgb A1c MFr Bld: 6 % — ABNORMAL HIGH (ref 4.8–5.6)
Mean Plasma Glucose: 125.5 mg/dL

## 2024-06-08 LAB — ECHOCARDIOGRAM COMPLETE
AR max vel: 3.23 cm2
AV Area VTI: 3.98 cm2
AV Area mean vel: 3.1 cm2
AV Mean grad: 4 mmHg
AV Peak grad: 6.3 mmHg
Ao pk vel: 1.25 m/s
Height: 71 in
S' Lateral: 3.5 cm
Weight: 2793.67 [oz_av]

## 2024-06-08 LAB — DIFFERENTIAL
Abs Immature Granulocytes: 0.34 K/uL — ABNORMAL HIGH (ref 0.00–0.07)
Basophils Absolute: 0.1 K/uL (ref 0.0–0.1)
Basophils Relative: 0 %
Eosinophils Absolute: 0 K/uL (ref 0.0–0.5)
Eosinophils Relative: 0 %
Immature Granulocytes: 2 %
Lymphocytes Relative: 5 %
Lymphs Abs: 1 K/uL (ref 0.7–4.0)
Monocytes Absolute: 1.7 K/uL — ABNORMAL HIGH (ref 0.1–1.0)
Monocytes Relative: 9 %
Neutro Abs: 16.9 K/uL — ABNORMAL HIGH (ref 1.7–7.7)
Neutrophils Relative %: 84 %
Smear Review: NORMAL

## 2024-06-08 LAB — URINALYSIS, W/ REFLEX TO CULTURE (INFECTION SUSPECTED)
Bacteria, UA: NONE SEEN
Bilirubin Urine: NEGATIVE
Glucose, UA: NEGATIVE mg/dL
Ketones, ur: NEGATIVE mg/dL
Leukocytes,Ua: NEGATIVE
Nitrite: NEGATIVE
Protein, ur: 100 mg/dL — AB
Specific Gravity, Urine: 1.043 — ABNORMAL HIGH (ref 1.005–1.030)
pH: 6 (ref 5.0–8.0)

## 2024-06-08 LAB — BLOOD CULTURE ID PANEL (REFLEXED) - BCID2

## 2024-06-08 LAB — COMPREHENSIVE METABOLIC PANEL WITH GFR
ALT: 71 U/L — ABNORMAL HIGH (ref 0–44)
AST: 144 U/L — ABNORMAL HIGH (ref 15–41)
Albumin: 3.3 g/dL — ABNORMAL LOW (ref 3.5–5.0)
Alkaline Phosphatase: 181 U/L — ABNORMAL HIGH (ref 38–126)
Anion gap: 12 (ref 5–15)
BUN: 36 mg/dL — ABNORMAL HIGH (ref 6–20)
CO2: 25 mmol/L (ref 22–32)
Calcium: 9 mg/dL (ref 8.9–10.3)
Chloride: 91 mmol/L — ABNORMAL LOW (ref 98–111)
Creatinine, Ser: 1 mg/dL (ref 0.61–1.24)
GFR, Estimated: >60 mL/min
Glucose, Bld: 143 mg/dL — ABNORMAL HIGH (ref 70–99)
Potassium: 4.5 mmol/L (ref 3.5–5.1)
Sodium: 128 mmol/L — ABNORMAL LOW (ref 135–145)
Total Bilirubin: 0.9 mg/dL (ref 0.0–1.2)
Total Protein: 8.1 g/dL (ref 6.5–8.1)

## 2024-06-08 LAB — CBC
HCT: 43.4 % (ref 39.0–52.0)
Hemoglobin: 15.1 g/dL (ref 13.0–17.0)
MCH: 29.4 pg (ref 26.0–34.0)
MCHC: 34.8 g/dL (ref 30.0–36.0)
MCV: 84.4 fL (ref 80.0–100.0)
Platelets: 108 K/uL — ABNORMAL LOW (ref 150–400)
RBC: 5.14 MIL/uL (ref 4.22–5.81)
RDW: 13.7 % (ref 11.5–15.5)
WBC: 20.1 K/uL — ABNORMAL HIGH (ref 4.0–10.5)
nRBC: 0 % (ref 0.0–0.2)

## 2024-06-08 LAB — URINE DRUG SCREEN
Amphetamines: POSITIVE — AB
Barbiturates: NEGATIVE
Benzodiazepines: NEGATIVE
Cocaine: NEGATIVE
Fentanyl: POSITIVE — AB
Methadone Scn, Ur: NEGATIVE
Opiates: NEGATIVE
Tetrahydrocannabinol: NEGATIVE

## 2024-06-08 LAB — I-STAT CG4 LACTIC ACID, ED
Lactic Acid, Venous: 2.5 mmol/L (ref 0.5–1.9)
Lactic Acid, Venous: 2.6 mmol/L (ref 0.5–1.9)
Lactic Acid, Venous: 1.5 mmol/L (ref 0.5–1.9)

## 2024-06-08 LAB — RESP PANEL BY RT-PCR (RSV, FLU A&B, COVID)  RVPGX2
Influenza A by PCR: NEGATIVE
Influenza B by PCR: NEGATIVE
Resp Syncytial Virus by PCR: NEGATIVE
SARS Coronavirus 2 by RT PCR: NEGATIVE

## 2024-06-08 LAB — AMMONIA: Ammonia: 28 umol/L (ref 9–35)

## 2024-06-08 LAB — PROTIME-INR
INR: 1.3 — ABNORMAL HIGH (ref 0.8–1.2)
Prothrombin Time: 17.1 s — ABNORMAL HIGH (ref 11.4–15.2)

## 2024-06-08 LAB — GAMMA GT: GGT: 68 U/L — ABNORMAL HIGH (ref 7–50)

## 2024-06-08 LAB — LIPID PANEL
Cholesterol: 74 mg/dL (ref 0–200)
HDL: <10 mg/dL — ABNORMAL LOW
Triglycerides: 209 mg/dL — ABNORMAL HIGH
VLDL: 42 mg/dL — ABNORMAL HIGH (ref 0–40)

## 2024-06-08 LAB — GLUCOSE, CAPILLARY: Glucose-Capillary: 123 mg/dL — ABNORMAL HIGH (ref 70–99)

## 2024-06-08 LAB — APTT: aPTT: 38 s — ABNORMAL HIGH (ref 24–36)

## 2024-06-08 LAB — BLOOD GAS, VENOUS
Acid-Base Excess: 1.9 mmol/L (ref 0.0–2.0)
Bicarbonate: 26.6 mmol/L (ref 20.0–28.0)
O2 Saturation: 94.8 %
Patient temperature: 37.2
pCO2, Ven: 41 mmHg — ABNORMAL LOW (ref 44–60)
pH, Ven: 7.42 (ref 7.25–7.43)
pO2, Ven: 65 mmHg — ABNORMAL HIGH (ref 32–45)

## 2024-06-08 LAB — PROCALCITONIN: Procalcitonin: 3.05 ng/mL

## 2024-06-08 LAB — CBG MONITORING, ED: Glucose-Capillary: 161 mg/dL — ABNORMAL HIGH (ref 70–99)

## 2024-06-08 LAB — LDL CHOLESTEROL, DIRECT: Direct LDL: 5 mg/dL (ref 0–99)

## 2024-06-08 LAB — ETHANOL: Alcohol, Ethyl (B): <15 mg/dL

## 2024-06-08 MED ORDER — NALOXONE HCL 0.4 MG/ML IJ SOLN
0.4000 mg | Freq: Once | INTRAMUSCULAR | Status: AC
  Administered 2024-06-08: 0.4 mg via INTRAVENOUS
  Filled 2024-06-08: qty 1

## 2024-06-08 MED ORDER — ACETAMINOPHEN 650 MG RE SUPP
650.0000 mg | Freq: Four times a day (QID) | RECTAL | Status: DC | PRN
  Administered 2024-06-08 – 2024-06-14 (×5): 650 mg via RECTAL
  Filled 2024-06-08 (×5): qty 1

## 2024-06-08 MED ORDER — SODIUM CHLORIDE 0.9 % IV SOLN
2.0000 g | Freq: Once | INTRAVENOUS | Status: AC
  Administered 2024-06-08: 2 g via INTRAVENOUS
  Filled 2024-06-08: qty 12.5

## 2024-06-08 MED ORDER — SODIUM CHLORIDE 0.9 % IV SOLN
2.0000 g | Freq: Three times a day (TID) | INTRAVENOUS | Status: DC
  Administered 2024-06-08: 2 g via INTRAVENOUS
  Filled 2024-06-08: qty 12.5

## 2024-06-08 MED ORDER — SODIUM CHLORIDE 0.9 % IV BOLUS
1000.0000 mL | Freq: Once | INTRAVENOUS | Status: AC
  Administered 2024-06-08: 1000 mL via INTRAVENOUS

## 2024-06-08 MED ORDER — STROKE: EARLY STAGES OF RECOVERY BOOK
Freq: Once | Status: AC
  Administered 2024-06-09: 1
  Filled 2024-06-08: qty 1

## 2024-06-08 MED ORDER — VANCOMYCIN HCL 1500 MG/300ML IV SOLN
1500.0000 mg | Freq: Once | INTRAVENOUS | Status: AC
  Administered 2024-06-08: 1500 mg via INTRAVENOUS
  Filled 2024-06-08: qty 300

## 2024-06-08 MED ORDER — LACTATED RINGERS IV SOLN: INTRAVENOUS | Status: AC

## 2024-06-08 MED ORDER — NALOXONE HCL 0.4 MG/ML IJ SOLN: 0.4000 mg | INTRAMUSCULAR | Status: DC | PRN

## 2024-06-08 MED ORDER — LORAZEPAM 2 MG/ML IJ SOLN
1.0000 mg | Freq: Once | INTRAMUSCULAR | Status: AC
  Administered 2024-06-08: 1 mg via INTRAVENOUS
  Filled 2024-06-08: qty 1

## 2024-06-08 MED ORDER — IOHEXOL 350 MG/ML SOLN
75.0000 mL | Freq: Once | INTRAVENOUS | Status: AC | PRN
  Administered 2024-06-08: 75 mL via INTRAVENOUS

## 2024-06-08 MED ORDER — LACTATED RINGERS IV BOLUS
1000.0000 mL | Freq: Once | INTRAVENOUS | Status: AC
  Administered 2024-06-08: 1000 mL via INTRAVENOUS

## 2024-06-08 MED ORDER — NALOXONE HCL 2 MG/2ML IJ SOSY
1.0000 mg | PREFILLED_SYRINGE | Freq: Once | INTRAMUSCULAR | Status: AC
  Administered 2024-06-08: 1 mg via INTRAVENOUS
  Filled 2024-06-08: qty 2

## 2024-06-08 MED ORDER — BUPRENORPHINE HCL-NALOXONE HCL 8-2 MG SL SUBL: 1.0000 | SUBLINGUAL_TABLET | Freq: Three times a day (TID) | SUBLINGUAL | Status: DC

## 2024-06-08 MED ORDER — VANCOMYCIN HCL 1250 MG/250ML IV SOLN
1250.0000 mg | Freq: Two times a day (BID) | INTRAVENOUS | Status: DC
  Administered 2024-06-08 – 2024-06-10 (×5): 1250 mg via INTRAVENOUS
  Filled 2024-06-08 (×6): qty 250

## 2024-06-08 MED ORDER — BUPRENORPHINE HCL-NALOXONE HCL 8-2 MG SL SUBL
1.0000 | SUBLINGUAL_TABLET | Freq: Three times a day (TID) | SUBLINGUAL | Status: DC | PRN
  Administered 2024-06-08 (×2): 1 via SUBLINGUAL
  Filled 2024-06-08 (×3): qty 1

## 2024-06-08 MED ORDER — ACETAMINOPHEN 500 MG PO TABS
1000.0000 mg | ORAL_TABLET | Freq: Once | ORAL | Status: AC
  Administered 2024-06-08: 1000 mg via ORAL
  Filled 2024-06-08: qty 2

## 2024-06-08 MED ORDER — ACETAMINOPHEN 325 MG PO TABS
650.0000 mg | ORAL_TABLET | Freq: Four times a day (QID) | ORAL | Status: DC | PRN
  Administered 2024-06-13 – 2024-06-15 (×2): 650 mg via ORAL
  Filled 2024-06-08 (×3): qty 2

## 2024-06-08 NOTE — ED Notes (Signed)
 When you have time, Lajuane Leatham (daughter) [phone] would like an update on pt. Thank you

## 2024-06-08 NOTE — Evaluation (Signed)
 Clinical/Bedside Swallow Evaluation Patient Details  Name: Patrick [name] MRN: [id number] Date of Birth: 1980-10-07  Today's Date: 06/08/2024 Time: SLP Start Time (ACUTE ONLY): 1520 SLP Stop Time (ACUTE ONLY): 1529 SLP Time Calculation (min) (ACUTE ONLY): 9 min  Past Medical History:  Past Medical History:  Diagnosis Date   Arthritis    Past Surgical History: No past surgical history on file. HPI:  43 yo male presenting to ED 12/30 with L facial droop and L arm weakness. UDS positive for amphetamines and fentanyl . MRI shows acute/subacute infraction in the R MCA territory with possible small hemorrhagic transformation in addition to acute infarcts in the L cerebellum and L temporal lobe. Not a candidate for TNK or thrombectomy. Concern for endocarditis. CT C/A/P shows RLL posterior basal consolidation and additional R upper and middle lobe peripheral consolidations, consistent with multifocal pneumonia. PMH includes polysubstance abuse, daily IV meth use    Assessment / Plan / Recommendation  Clinical Impression  Recommend NPO with the exception of ice chips in moderation with frequent oral care. Meds can be given crushed in puree. Expect improved function once more consistently alert, though acute CN deficits may also be contributing to dysphagia. SLP will f/u for further assessment.   Pt initially passed the swallow screen but was observed to cough mutliple times when RN provided water during SLP visit for cognitive evaluation. His level of alertness fluctuates greatly but he generally keeps his eyes closed and needs assistance for feeding. He presents with severe L CN VII and XII deficits. He has difficulty maintaining a labial seal with resulting anterior loss of thin liquids and purees. Oral transit was mildly prolonged with purees but he ultimately clears his oral cavity. Regardless of volume, sips of thin liquids are followed by explosive coughing.   SLP Visit Diagnosis: Dysphagia,  unspecified (R13.10)    Aspiration Risk  Moderate aspiration risk    Diet Recommendation           Other Recommendations Oral Care Recommendations: Oral care QID;Oral care prior to ice chip/H20     Swallow Evaluation Recommendations Recommendations: NPO except meds;Ice chips PRN after oral care Medication Administration: Crushed with puree Oral care recommendations: Oral care QID (4x/day);Oral care before ice chips/water   Assistance Recommended at Discharge    Functional Status Assessment Patient has had a recent decline in their functional status and demonstrates the ability to make significant improvements in function in a reasonable and predictable amount of time.  Frequency and Duration min 2x/week  2 weeks       Prognosis Prognosis for improved oropharyngeal function: Good Barriers to Reach Goals: Cognitive deficits      Swallow Study   General HPI: 43 yo male presenting to ED 12/30 with L facial droop and L arm weakness. UDS positive for amphetamines and fentanyl . MRI shows acute/subacute infraction in the R MCA territory with possible small hemorrhagic transformation in addition to acute infarcts in the L cerebellum and L temporal lobe. Not a candidate for TNK or thrombectomy. Concern for endocarditis. CT C/A/P shows RLL posterior basal consolidation and additional R upper and middle lobe peripheral consolidations, consistent with multifocal pneumonia. PMH includes polysubstance abuse, daily IV meth use Type of Study: Bedside Swallow Evaluation Previous Swallow Assessment: none in chart Diet Prior to this Study: NPO Temperature Spikes Noted: No Respiratory Status: Room air History of Recent Intubation: No Behavior/Cognition: Lethargic/Drowsy Oral Cavity Assessment: Within Functional Limits Oral Care Completed by SLP: No Oral Cavity - Dentition: Missing dentition;Poor condition  Vision: Functional for self-feeding Self-Feeding Abilities: Able to feed self;Needs set  up Patient Positioning: Upright in bed Baseline Vocal Quality: Normal Volitional Cough: Strong Volitional Swallow: Able to elicit    Oral/Motor/Sensory Function Overall Oral Motor/Sensory Function: Severe impairment Facial ROM: Reduced left;Suspected CN VII (facial) dysfunction Facial Symmetry: Abnormal symmetry left;Suspected CN VII (facial) dysfunction Facial Strength: Reduced left;Suspected CN VII (facial) dysfunction Lingual ROM: Reduced left;Suspected CN XII (hypoglossal) dysfunction Lingual Symmetry: Abnormal symmetry left;Suspected CN XII (hypoglossal) dysfunction Lingual Strength: Reduced;Suspected CN XII (hypoglossal) dysfunction   Ice Chips Ice chips: Not tested   Thin Liquid Thin Liquid: Impaired Presentation: Straw;Self Fed Oral Phase Functional Implications: Left anterior spillage Pharyngeal  Phase Impairments: Multiple swallows;Wet Vocal Quality;Cough - Immediate    Nectar Thick Nectar Thick Liquid: Not tested   Honey Thick Honey Thick Liquid: Not tested   Puree Puree: Impaired Presentation: Spoon Oral Phase Functional Implications: Prolonged oral transit   Solid     Solid: Not tested      [name], M.A., CCC-SLP Speech Language Pathology, Acute Rehabilitation Services  Secure Chat preferred [phone]  06/08/2024,4:05 PM

## 2024-06-08 NOTE — ED Notes (Signed)
 Hospitalist at bedside

## 2024-06-08 NOTE — Progress Notes (Signed)
 Pharmacy Antibiotic Note  Patrick [name] is a 43 y.o. male admitted on 06/07/2024 with sepsis.  Pharmacy has been consulted for Vancomycin dosing. ?source as LE cellulitis. WBC elevated. Renal function ok.   Plan: Vancomycin 1250 mg IV q12h >>>Estimated AUC: 538 Cefepime per MD Trend WBC, temp, renal function  F/U infectious work-up Drug levels as indicated   Height: 5' 11 (180.3 cm) Weight: 79.2 kg (174 lb 9.7 oz) IBW/kg (Calculated) : 75.3  Temp (24hrs), Avg:99.9 F (37.7 C), Min:98.7 F (37.1 C), Max:101.1 F (38.4 C)  Recent Labs  Lab 06/07/24 2331 06/07/24 2335 06/08/24 0123 06/08/24 0337  WBC 20.1*  --   --   --   CREATININE 1.00 1.10  --   --   LATICACIDVEN  --   --  2.5* 2.6*    Estimated Creatinine Clearance: 93.2 mL/min (by C-G formula based on SCr of 1.1 mg/dL).    Allergies[1]  Lynwood Mckusick, PharmD, BCPS Clinical Pharmacist Phone: [phone]      [1] No Known Allergies

## 2024-06-08 NOTE — ED Notes (Signed)
 Narcan administered and pt more alert and able to answer questions accordingly.

## 2024-06-08 NOTE — ED Notes (Signed)
 Pt seems to be increasingly more lethargic. Pt not responding to voice as much as when pt arrived to ED. EDP made aware and Narcan ordered.

## 2024-06-08 NOTE — ED Notes (Signed)
 Echo at bedside

## 2024-06-08 NOTE — Progress Notes (Signed)
 PHARMACY - PHYSICIAN COMMUNICATION CRITICAL VALUE ALERT - BLOOD CULTURE IDENTIFICATION (BCID)  Patrick [name] is an 43 y.o. male who presented to [facility] on 06/07/2024 with a chief complaint of altered mental status/code stroke  Assessment:  Blood cultures all positive for GPC in clusters.  MRSA detected by BCID  Name of physician (or Provider) Contacted: Dr. Georgina and Dr. Overton with ID  Current antibiotics: Vancomycin and cefepime  Changes to prescribed antibiotics recommended: stop cefepime, continue vancomycin Recommendations accepted by provider  Results for orders placed or performed during the hospital encounter of 06/07/24  Blood Culture ID Panel (Reflexed) (Collected: 06/08/2024 12:35 AM)  Result Value Ref Range   Enterococcus faecalis NOT DETECTED NOT DETECTED   Enterococcus Faecium NOT DETECTED NOT DETECTED   Listeria monocytogenes NOT DETECTED NOT DETECTED   Staphylococcus species DETECTED (A) NOT DETECTED   Staphylococcus aureus (BCID) DETECTED (A) NOT DETECTED   Staphylococcus epidermidis NOT DETECTED NOT DETECTED   Staphylococcus lugdunensis NOT DETECTED NOT DETECTED   Streptococcus species NOT DETECTED NOT DETECTED   Streptococcus agalactiae NOT DETECTED NOT DETECTED   Streptococcus pneumoniae NOT DETECTED NOT DETECTED   Streptococcus pyogenes NOT DETECTED NOT DETECTED   A.calcoaceticus-baumannii NOT DETECTED NOT DETECTED   Bacteroides fragilis NOT DETECTED NOT DETECTED   Enterobacterales NOT DETECTED NOT DETECTED   Enterobacter cloacae complex NOT DETECTED NOT DETECTED   Escherichia coli NOT DETECTED NOT DETECTED   Klebsiella aerogenes NOT DETECTED NOT DETECTED   Klebsiella oxytoca NOT DETECTED NOT DETECTED   Klebsiella pneumoniae NOT DETECTED NOT DETECTED   Proteus species NOT DETECTED NOT DETECTED   Salmonella species NOT DETECTED NOT DETECTED   Serratia marcescens NOT DETECTED NOT DETECTED   Haemophilus influenzae NOT DETECTED NOT DETECTED   Neisseria  meningitidis NOT DETECTED NOT DETECTED   Pseudomonas aeruginosa NOT DETECTED NOT DETECTED   Stenotrophomonas maltophilia NOT DETECTED NOT DETECTED   Candida albicans NOT DETECTED NOT DETECTED   Candida auris NOT DETECTED NOT DETECTED   Candida glabrata NOT DETECTED NOT DETECTED   Candida krusei NOT DETECTED NOT DETECTED   Candida parapsilosis NOT DETECTED NOT DETECTED   Candida tropicalis NOT DETECTED NOT DETECTED   Cryptococcus neoformans/gattii NOT DETECTED NOT DETECTED   Meth resistant mecA/C and MREJ DETECTED (A) NOT DETECTED    [name] 06/08/2024  12:16 PM

## 2024-06-08 NOTE — Progress Notes (Addendum)
 MEWS Progress Note  Patient Details Name: Patrick [name] MRN: [id number] DOB: 1980-11-08 Today's Date: 06/08/2024   MEWS Flowsheet Documentation:  Assess: MEWS Score Temp: (!) 101.4 F (38.6 C) BP: 131/80 MAP (mmHg): 110 Pulse Rate: (!) 116 ECG Heart Rate: (!) 124 Resp: (!) 25 Level of Consciousness: Responds to Voice SpO2: 98 % O2 Device: Room Air Assess: MEWS Score MEWS Temp: 1 MEWS Systolic: 0 MEWS Pulse: 2 MEWS RR: 1 MEWS LOC: 1 MEWS Score: 5 MEWS Score Color: Red Assess: SIRS CRITERIA SIRS Temperature : 1 SIRS Respirations : 1 SIRS Pulse: 1 SIRS WBC: 0 SIRS Score Sum : 3 SIRS Temperature : 1 SIRS Pulse: 1 SIRS Respirations : 1 SIRS WBC: 0 SIRS Score Sum : 3 Assess: if the MEWS score is Yellow or Red Were vital signs accurate and taken at a resting state?: Yes Does the patient meet 2 or more of the SIRS criteria?: Yes Does the patient have a confirmed or suspected source of infection?: Yes MEWS guidelines implemented : Yes, red Treat MEWS Interventions: Considered administering scheduled or prn medications/treatments as ordered Take Vital Signs Increase Vital Sign Frequency : Red: Q1hr x2, continue Q4hrs until patient remains green for 12hrs Escalate MEWS: Escalate: Red: Discuss with charge nurse and notify provider. Consider notifying RRT. If remains red for 2 hours consider need for higher level of care   Hele, Rapid response RN assessed patient at the bedside. Obtained EKG. PRN tylenol  given for fever      Iantha JAYSON Saba 06/08/2024, 4:20 PM

## 2024-06-08 NOTE — Evaluation (Signed)
 Physical Therapy Evaluation Patient Details Name: Patrick [name] MRN: [id number] DOB: 1980/08/17 Today's Date: 06/08/2024  History of Present Illness  Pt is 43 year old presented to [facility] on  06/07/24 for decr responsiveness and lt sided weakness. Suspected drug overdose. CT showed acute/subacute rt MCA infarct. MRI showed the same as well as mild hemorrhagic transformation and acute infarct in lt cerebellar hemisphere. Pt also with severe sepsis and PNA. PMH - arthritis, substance abuse  Clinical Impression  Pt admitted with above diagnosis and presents to PT with functional limitations due to deficits listed below (See PT problem list). Pt needs skilled PT to maximize independence and safety. Pt with limited mobility due to weakness, decr balance, and decr cognition. Unsure of prior living situation and support at dc. Patient will benefit from intensive inpatient follow-up therapy, >3 hours/day if he becomes more alert and able to participate.            If plan is discharge home, recommend the following: Two people to help with walking and/or transfers;A lot of help with bathing/dressing/bathroom;Direct supervision/assist for medications management;Direct supervision/assist for financial management;Assist for transportation   Can travel by private vehicle        Equipment Recommendations Other (comment) (TBD)  Recommendations for Other Services       Functional Status Assessment Patient has had a recent decline in their functional status and demonstrates the ability to make significant improvements in function in a reasonable and predictable amount of time.     Precautions / Restrictions Precautions Precautions: Fall Recall of Precautions/Restrictions: Impaired Restrictions Weight Bearing Restrictions Per Provider Order: No      Mobility  Bed Mobility Overal bed mobility: Needs Assistance Bed Mobility: Supine to Sit, Sit to Supine     Supine to sit: Max assist, HOB  elevated Sit to supine: Min assist   General bed mobility comments: Assist bring legs off of bed, elevate trunk into sitting, and bring hips to EOB. Assist to control descent of trunk returning to supine.    Transfers                   General transfer comment: Did not attempt    Ambulation/Gait                  Stairs            Wheelchair Mobility     Tilt Bed    Modified Rankin (Stroke Patients Only) Modified Rankin (Stroke Patients Only) Pre-Morbid Rankin Score: No symptoms Modified Rankin: Severe disability     Balance Overall balance assessment: Needs assistance Sitting-balance support: Bilateral upper extremity supported, Feet supported Sitting balance-Leahy Scale: Poor Sitting balance - Comments: UE support and CGA                                     Pertinent Vitals/Pain Pain Assessment Pain Assessment: No/denies pain    Home Living Family/patient expects to be discharged to:: Unsure                   Additional Comments: Pt lethargic and dysarthric    Prior Function Prior Level of Function : Patient poor historian/Family not available             Mobility Comments: Assume independent       Extremity/Trunk Assessment   Upper Extremity Assessment Upper Extremity Assessment: Defer to OT evaluation  Lower Extremity Assessment Lower Extremity Assessment: Difficult to assess due to impaired cognition;LLE deficits/detail LLE Deficits / Details: Appears weaker than rt functionally       Communication   Communication Communication: Impaired Factors Affecting Communication: Reduced clarity of speech;Difficulty expressing self    Cognition Arousal: Lethargic Behavior During Therapy: Flat affect   PT - Cognitive impairments: Difficult to assess Difficult to assess due to: Level of arousal, Impaired communication                     PT - Cognition Comments: Pt with eyes closed  throughout. Will answer questions but extremely difficult to understand due to dysarthria. Following commands: Impaired Following commands impaired: Follows one step commands inconsistently, Follows one step commands with increased time     Cueing Cueing Techniques: Verbal cues, Tactile cues     General Comments General comments (skin integrity, edema, etc.): HR 120's throughout    Exercises     Assessment/Plan    PT Assessment Patient needs continued PT services  PT Problem List Decreased strength;Decreased activity tolerance;Decreased balance;Decreased mobility;Decreased cognition;Decreased knowledge of use of DME       PT Treatment Interventions DME instruction;Gait training;Functional mobility training;Therapeutic activities;Therapeutic exercise;Balance training;Patient/family education;Cognitive remediation;Neuromuscular re-education    PT Goals (Current goals can be found in the Care Plan section)  Acute Rehab PT Goals Patient Stated Goal: Unable to state PT Goal Formulation: Patient unable to participate in goal setting Time For Goal Achievement: 06/22/24 Potential to Achieve Goals: Fair    Frequency Min 2X/week     Co-evaluation               AM-PAC PT 6 Clicks Mobility  Outcome Measure Help needed turning from your back to your side while in a flat bed without using bedrails?: A Lot Help needed moving from lying on your back to sitting on the side of a flat bed without using bedrails?: A Lot Help needed moving to and from a bed to a chair (including a wheelchair)?: Total Help needed standing up from a chair using your arms (e.g., wheelchair or bedside chair)?: Total Help needed to walk in hospital room?: Total Help needed climbing 3-5 steps with a railing? : Total 6 Click Score: 8    End of Session   Activity Tolerance: Patient limited by lethargy;Patient limited by fatigue Patient left: in bed;with call bell/phone within reach;with bed alarm set    PT Visit Diagnosis: Other abnormalities of gait and mobility (R26.89);Muscle weakness (generalized) (M62.81);Hemiplegia and hemiparesis Hemiplegia - Right/Left: Left Hemiplegia - dominant/non-dominant: Non-dominant Hemiplegia - caused by: Cerebral infarction    Time: 8361-8346 PT Time Calculation (min) (ACUTE ONLY): 15 min   Charges:   PT Evaluation $PT Eval Moderate Complexity: 1 Mod   PT General Charges $$ ACUTE PT VISIT: 1 Visit         [facility] PT Acute Rehabilitation Services Office [phone]   [facility]gers ORN [facility] 06/08/2024, 5:31 PM

## 2024-06-08 NOTE — Progress Notes (Addendum)
 Called to eval pt for potential ICU admission  Went to see pt vss. No acute distress, resting in bed with even, unlabored respirations. He is garbled in speech post cva and suspected drug use. Pt awakens easily. States he was brought in after falling. Remains weak on L side. Neurology seen in ed and no acute intervention available 2/2 presentation and outside window.   Subacute cva  -Further stroke w/u per neuro  -Permissive htn for first 24hr < 220/110  Suspected drug overdose, unclear intentional vs unintentional  -awakens but drowsy  -no need for narcan infusion  -elevated lft likely related to this  -recommend check ggt  Possible LE cellulitis L foot  -rec blood cx and empiric abx for now  No acute need for ICU at this time. D/w EDP as well, who will notify [facility].  Please reach back out to ccm for any change warranting re-eval.

## 2024-06-08 NOTE — Evaluation (Signed)
 Speech Language Pathology Evaluation Patient Details Name: Patrick [name] MRN: [id number] DOB: July 18, 1980 Today's Date: 06/08/2024 Time: 8470-8461 SLP Time Calculation (min) (ACUTE ONLY): 9 min  Problem List:  Patient Active Problem List   Diagnosis Date Noted   Acute encephalopathy 06/08/2024   Past Medical History:  Past Medical History:  Diagnosis Date   Arthritis    Past Surgical History: No past surgical history on file. HPI:  43 yo male presenting to ED 12/30 with L facial droop and L arm weakness. UDS positive for amphetamines and fentanyl . MRI shows acute/subacute infraction in the R MCA territory with possible small hemorrhagic transformation in addition to acute infarcts in the L cerebellum and L temporal lobe. Not a candidate for TNK or thrombectomy. Concern for endocarditis. CT C/A/P shows RLL posterior basal consolidation and additional R upper and middle lobe peripheral consolidations, consistent with multifocal pneumonia. PMH includes polysubstance abuse, daily IV meth use   Assessment / Plan / Recommendation Clinical Impression  CN VII and XII contribute to severe dysarthria at the phrase level, which may be further impacted by his level of alertness. RN reports this has fluctuated significantly but his eyes remained closed throughout the majority of our session. He also exhibits deficits related to attention, awareness, and memory. Auditory comprehension appears generally WFL. Unable to gather much information regarding his PLOF, though his performance is suspected to represent an acute change. Will continue following.     SLP Assessment  SLP Recommendation/Assessment: Patient needs continued Speech Language Pathology Services SLP Visit Diagnosis: Dysarthria and anarthria (R47.1);Cognitive communication deficit (R41.841)     Assistance Recommended at Discharge  Frequent or constant Supervision/Assistance  Functional Status Assessment Patient has had a recent decline in  their functional status and demonstrates the ability to make significant improvements in function in a reasonable and predictable amount of time.  Frequency and Duration min 2x/week  2 weeks      SLP Evaluation Cognition  Overall Cognitive Status: Impaired/Different from baseline Arousal/Alertness: Lethargic Orientation Level: Oriented to person;Oriented to place;Disoriented to situation;Disoriented to time Attention: Sustained Sustained Attention: Impaired Sustained Attention Impairment: Verbal basic;Functional basic Memory: Impaired Memory Impairment: Retrieval deficit Awareness: Impaired Awareness Impairment: Intellectual impairment Safety/Judgment: Impaired       Comprehension  Auditory Comprehension Overall Auditory Comprehension: Appears within functional limits for tasks assessed    Expression Expression Primary Mode of Expression: Verbal Verbal Expression Overall Verbal Expression: Appears within functional limits for tasks assessed   Oral / Motor  Oral Motor/Sensory Function Overall Oral Motor/Sensory Function: Severe impairment Facial ROM: Reduced left;Suspected CN VII (facial) dysfunction Facial Symmetry: Abnormal symmetry left;Suspected CN VII (facial) dysfunction Facial Strength: Reduced left;Suspected CN VII (facial) dysfunction Lingual ROM: Reduced left;Suspected CN XII (hypoglossal) dysfunction Lingual Symmetry: Abnormal symmetry left;Suspected CN XII (hypoglossal) dysfunction Lingual Strength: Reduced;Suspected CN XII (hypoglossal) dysfunction Motor Speech Overall Motor Speech: Impaired Respiration: Within functional limits Phonation: Normal Resonance: Within functional limits Articulation: Impaired Level of Impairment: Phrase Intelligibility: Intelligibility reduced Phrase: 50-74% accurate            [name], M.A., CCC-SLP Speech Language Pathology, Acute Rehabilitation Services  Secure Chat preferred [phone]  06/08/2024, 4:12 PM

## 2024-06-08 NOTE — ED Notes (Signed)
 Lennie, MD made aware of recent NIH and neuro assessment

## 2024-06-08 NOTE — ED Notes (Signed)
 When you have time, Bryceson Grape (daughter) [phone] would like an update on pt. Thank you

## 2024-06-08 NOTE — ED Notes (Signed)
 Patient transported to MRI

## 2024-06-08 NOTE — Consult Note (Signed)
 "        [facility]    Date of Admission:  06/07/2024     Reason for Consult: mssa bsi    Referring Provider: albertina; Marsha Ada     Lines:  Peripheral iv's  Abx: 12/31-c vanc  12/31 cefepime        Assessment: 43 yo male with ivdu (fentanyl ) admitted 12/30 with l facial droop and lue weakness found to have multifocal stroke, bilateral pulm airspace opacity, mrsa pna and tte finding of av vegetation, complicated by opiate withdrawal  Picture consistent with mrsa IE in setting ivdu  Very confused today due to withdrawal/stroke -- will need to reassess for metastatic pyogenic complication  Abd pelv chest ct no obvious spine or paraspinous involvement but if sx will need to get dedicated spine imaging  Chest airspace opacity could be aspiration or septic  pulm nodules from mrsa bsi. He is doing well on room air as of my initial visit     Plan: Hiv, hepatitis panel, rpr Continue vancomycin -- cns coverage best over dapto Repeat bcx Have asked cards for tee -- will try to do this coming Monday 1/5 Maintain contact isolation precaution Withdrawal and stroke management per primary team/neurology       ------------------------------------------------ Principal Problem:   Acute encephalopathy    HPI: Patrick [name] is a 43 y.o. male with ivdu (fentanyl ) admitted 12/30 with l facial droop and lue weakness found to have multifocal stroke, bilateral pulm airspace opacity, mrsa pna and tte finding of av vegetation, complicated by opiate withdrawal   Patient limited historian given ams/active withdrawal and stroke  He was biba. Given narcans enroute  On presentation febrile Tachycardic, tachypneic, restless Wbc 20 Brain imaging and ct chest imaging as below Started on bsAbx after bcx obtained (returned quikcly with mrsa)  Unable to give any ros at this time   No family history on file.  Social  History[1]  Allergies[2]  Review of Systems: ROS All Other ROS was negative, except mentioned above   Past Medical History:  Diagnosis Date   Arthritis        Scheduled Meds:  [START ON 06/09/2024]  stroke: early stages of recovery book   Does not apply Once   aspirin  325 mg Oral Once   clopidogrel  300 mg Oral Once   sodium chloride  flush  3 mL Intravenous Once   Continuous Infusions:  vancomycin 1,250 mg (06/08/24 2132)   PRN Meds:.acetaminophen  **OR** acetaminophen , buprenorphine-naloxone, naLOXone (NARCAN)  injection   OBJECTIVE: Blood pressure (!) 149/99, pulse (!) 110, temperature 97.8 F (36.6 C), temperature source Oral, resp. rate 18, height 5' 11 (1.803 m), weight 79.2 kg, SpO2 100%.  Physical Exam  General/constitutional: restless; actively withdrawing, unable to focus or redirected HEENT: Normocephalic, PER, Conj Clear, EOMI, Oropharynx clear Neck supple CV: rrr no mrg Lungs: clear to auscultation, normal respiratory effort Abd: Soft, Nontender Ext: no edema Skin: no peripheral petechiae; dorsum of hands iv track marks; goose bumps Neuro: confused, lethargic MSK: no peripheral joint swelling/tenderness/warmth; back spines nontender      Lab Results Lab Results  Component Value Date   WBC 20.1 (H) 06/07/2024   HGB 16.3 06/07/2024   HCT 48.0 06/07/2024   MCV 84.4 06/07/2024   PLT 108 (L) 06/07/2024    Lab Results  Component Value Date   CREATININE 1.10 06/07/2024   BUN 37 (H) 06/07/2024   NA 130 (L) 06/07/2024   K 4.1 06/07/2024  CL 94 (L) 06/07/2024   CO2 25 06/07/2024    Lab Results  Component Value Date   ALT 71 (H) 06/07/2024   AST 144 (H) 06/07/2024   GGT 68 (H) 06/08/2024   ALKPHOS 181 (H) 06/07/2024   BILITOT 0.9 06/07/2024      Microbiology: Recent Results (from the past 240 hours)  Blood culture (routine x 2)     Status: None (Preliminary result)   Collection Time: 06/08/24 12:35 AM   Specimen: BLOOD RIGHT ARM   Result Value Ref Range Status   Specimen Description BLOOD RIGHT ARM  Final   Special Requests   Final    BOTTLES DRAWN AEROBIC AND ANAEROBIC Blood Culture adequate volume   Culture  Setup Time   Final    GRAM POSITIVE COCCI IN CLUSTERS IN BOTH AEROBIC AND ANAEROBIC BOTTLES CRITICAL RESULT CALLED TO, READ BACK BY AND VERIFIED WITH: PHARMD J.FRENS AT 1213 ON 06/08/2024 BY T.SAAD. Performed at [facility], [address] [place], [place] [address]    Culture GRAM POSITIVE COCCI  Final   Report Status PENDING  Incomplete  Blood Culture ID Panel (Reflexed)     Status: Abnormal   Collection Time: 06/08/24 12:35 AM  Result Value Ref Range Status   Enterococcus faecalis NOT DETECTED NOT DETECTED Final   Enterococcus Faecium NOT DETECTED NOT DETECTED Final   Listeria monocytogenes NOT DETECTED NOT DETECTED Final   Staphylococcus species DETECTED (A) NOT DETECTED Final    Comment: CRITICAL RESULT CALLED TO, READ BACK BY AND VERIFIED WITH: PHARMD J.FRENS AT 1213 ON 06/08/2024 BY T.SAAD.    Staphylococcus aureus (BCID) DETECTED (A) NOT DETECTED Final    Comment: Methicillin (oxacillin)-resistant Staphylococcus aureus (MRSA). MRSA is predictably resistant to beta-lactam antibiotics (except ceftaroline). Preferred therapy is vancomycin unless clinically contraindicated. Patient requires contact precautions if  hospitalized. CRITICAL RESULT CALLED TO, READ BACK BY AND VERIFIED WITH: PHARMD J.FRENS AT 1213 ON 06/08/2024 BY T.SAAD.    Staphylococcus epidermidis NOT DETECTED NOT DETECTED Final   Staphylococcus lugdunensis NOT DETECTED NOT DETECTED Final   Streptococcus species NOT DETECTED NOT DETECTED Final   Streptococcus agalactiae NOT DETECTED NOT DETECTED Final   Streptococcus pneumoniae NOT DETECTED NOT DETECTED Final   Streptococcus pyogenes NOT DETECTED NOT DETECTED Final   A.calcoaceticus-baumannii NOT DETECTED NOT DETECTED Final   Bacteroides fragilis NOT DETECTED NOT DETECTED  Final   Enterobacterales NOT DETECTED NOT DETECTED Final   Enterobacter cloacae complex NOT DETECTED NOT DETECTED Final   Escherichia coli NOT DETECTED NOT DETECTED Final   Klebsiella aerogenes NOT DETECTED NOT DETECTED Final   Klebsiella oxytoca NOT DETECTED NOT DETECTED Final   Klebsiella pneumoniae NOT DETECTED NOT DETECTED Final   Proteus species NOT DETECTED NOT DETECTED Final   Salmonella species NOT DETECTED NOT DETECTED Final   Serratia marcescens NOT DETECTED NOT DETECTED Final   Haemophilus influenzae NOT DETECTED NOT DETECTED Final   Neisseria meningitidis NOT DETECTED NOT DETECTED Final   Pseudomonas aeruginosa NOT DETECTED NOT DETECTED Final   Stenotrophomonas maltophilia NOT DETECTED NOT DETECTED Final   Candida albicans NOT DETECTED NOT DETECTED Final   Candida auris NOT DETECTED NOT DETECTED Final   Candida glabrata NOT DETECTED NOT DETECTED Final   Candida krusei NOT DETECTED NOT DETECTED Final   Candida parapsilosis NOT DETECTED NOT DETECTED Final   Candida tropicalis NOT DETECTED NOT DETECTED Final   Cryptococcus neoformans/gattii NOT DETECTED NOT DETECTED Final   Meth resistant mecA/C and MREJ DETECTED (A) NOT DETECTED  Final    Comment: CRITICAL RESULT CALLED TO, READ BACK BY AND VERIFIED WITH: PHARMD J.FRENS AT 1213 ON 06/08/2024 BY T.SAAD. Performed at [facility], [address] [place], [place] [address]   Blood culture (routine x 2)     Status: None (Preliminary result)   Collection Time: 06/08/24 12:40 AM   Specimen: BLOOD LEFT ARM  Result Value Ref Range Status   Specimen Description BLOOD LEFT ARM  Final   Special Requests   Final    BOTTLES DRAWN AEROBIC AND ANAEROBIC Blood Culture adequate volume   Culture  Setup Time   Final    GRAM POSITIVE COCCI IN CLUSTERS IN BOTH AEROBIC AND ANAEROBIC BOTTLES CRITICAL VALUE NOTED.  VALUE IS CONSISTENT WITH PREVIOUSLY REPORTED AND CALLED VALUE. Performed at [facility], [address]  [place], [place] [address]    Culture GRAM POSITIVE COCCI  Final   Report Status PENDING  Incomplete  Resp panel by RT-PCR (RSV, Flu A&B, Covid) Anterior Nasal Swab     Status: None   Collection Time: 06/08/24  3:54 AM   Specimen: Anterior Nasal Swab  Result Value Ref Range Status   SARS Coronavirus 2 by RT PCR NEGATIVE NEGATIVE Final   Influenza A by PCR NEGATIVE NEGATIVE Final   Influenza B by PCR NEGATIVE NEGATIVE Final    Comment: (NOTE) The Xpert Xpress SARS-CoV-2/FLU/RSV plus assay is intended as an aid in the diagnosis of influenza from Nasopharyngeal swab specimens and should not be used as a sole basis for treatment. Nasal washings and aspirates are unacceptable for Xpert Xpress SARS-CoV-2/FLU/RSV testing.  Fact Sheet for Patients: bloggercourse.com  Fact Sheet for Healthcare Providers: seriousbroker.it  This test is not yet approved or cleared by the [place]  FDA and has been authorized for detection and/or diagnosis of SARS-CoV-2 by FDA under an Emergency Use Authorization (EUA). This EUA will remain in effect (meaning this test can be used) for the duration of the COVID-19 declaration under Section 564(b)(1) of the Act, 21 U.S.C. section 360bbb-3(b)(1), unless the authorization is terminated or revoked.     Resp Syncytial Virus by PCR NEGATIVE NEGATIVE Final    Comment: (NOTE) Fact Sheet for Patients: bloggercourse.com  Fact Sheet for Healthcare Providers: seriousbroker.it  This test is not yet approved or cleared by the [place]  FDA and has been authorized for detection and/or diagnosis of SARS-CoV-2 by FDA under an Emergency Use Authorization (EUA). This EUA will remain in effect (meaning this test can be used) for the duration of the COVID-19 declaration under Section 564(b)(1) of the Act, 21 U.S.C. section 360bbb-3(b)(1), unless the authorization is  terminated or revoked.  Performed at [facility], [address], [place], [place] [address]      Serology:    Imaging: If present, new imagings (plain films, ct scans, and mri) have been personally visualized and interpreted; radiology reports have been reviewed. Decision making incorporated into the Impression / Recommendations.   12/30 ct chest abd pelv 1. Right lower lobe posterior basal consolidation and additional right upper and middle lobe peripheral consolidations, most consistent with multifocal pneumonia. Additional considerations regarding the scattered areas of peripheral consolidation within the right upper and right middle lobe are as described above, and correlation with the patient's immunocompetency status is warranted. 2. Diffuse bronchial wall thickening with moderate mucus impaction in right lower lobe segmental bronchi, consistent with airway inflammation. 3. Hepatosplenomegaly, new from prior examination. 4. Indeterminate noncalcified left upper lobe pulmonary  nodule, with recommendation for non-contrast chest CT at 3-6 months and then consideration of non-contrast chest CT at 18-24 months as per Fleischner Society Guidelines. 5. Marked atrophy of the right kidney with compensatory hypertrophy of the left kidney. 6. Moderate sigmoid diverticulosis without acute inflammatory change. 7. Mild coronary artery calcification and mild aortoiliac atherosclerotic calcification. 8. Partially visualized moderate right scrotal hydrocele.   12/31 ct angio head neck 1. No large vessel occlusion, hemodynamically significant stenosis, or aneurysm in the head or neck. 2. Atherosclerosis at the carotid bifurcations without hemodynamically significant stenosis of the internal carotid arteries.   12/31 mri brain 1. Acute/subacute infarction at the right frontoparietal junction/insular region (right MCA territory) with associated hemosiderin staining,  most compatible with mild hemorrhagic transformation/oozing. 2. Acute infarct in the left cerebellar hemisphere. 3. Small acute lacunar infarcts in the left temporal lobe. 4. Additional focus in the anteromedial right frontal subcortical white matter, compatible with an additional acute infarct. 5. Blooming artifact in the posteromedial right occipital lobe, most consistent with a small chronic hemorrhagic focus (e.g., microhemorrhage). 6. Mucosal disease in the ethmoid and maxillary sinuses.    Constance ONEIDA Passer, MD [facility] [phone] pager    06/08/2024, 11:59 PM     [1]  Social History Tobacco Use   Smoking status: Every Day  Substance Use Topics   Alcohol use: Yes   Drug use: No  [2] No Known Allergies  "

## 2024-06-08 NOTE — H&P (Addendum)
 " History and Physical    Patient: Patrick [name] FMW:[id number] DOB: Sep 27, 1980 DOA: 06/07/2024 DOS: the patient was seen and examined on 06/08/2024 PCP: Patient, No Pcp Per  Patient coming from: Home  Chief Complaint:  Chief Complaint  Patient presents with   Code Stroke   HPI: Patrick [name] is a 43 y.o. male with medical history significant of polysubstance use, and IVDU who p/w L facial droop and LUE weakness and found to have acute/subacute stroke on MRI c/b acute opiate withdrawal, MRSA bacteremia, and possible AV vegetation on TTE.  Pt is a limited historian and unable to provide much history given ongoing lethargy iso active withdrawal. Of note, pt was somewhat more alert after receiving Narcan en route, but is very somnolent at this time; thus, CCM consulted. Of note, he also admitted to daily IVDA to the Neurology team.  In the ED, hypertensive, tachycardic, and tachypneic c/w active withdrawal. Labs notable for Na 130, AST/ALT 144/71, lactic acid 2.6>1.5, and WBC 20.1. UDS positive for amphetamins and fentanyl . Blood cultures positive for MRSA. TTE c/f valvular vegetation. CT chest/abd/pelvis showed right lower lobe posterior basal consolidation and additional right upper and middle lobe peripheral consolidations. CTA w / no LVO. MRI brain showed acute/subacute infarction at the right frontoparietal junction/insular region (right MCA territory) with associated hemosiderin staining, most compatible with mild hemorrhagic transformation/oozing, acute infarct in the left cerebellar hemisphere, and a small acute lacunar infarcts in the left temporal lobe. EDP requested admission for above.   Review of Systems: As mentioned in the history of present illness. All other systems reviewed and are negative. Past Medical History:  Diagnosis Date   Arthritis    No past surgical history on file. Social History:  reports that he has been smoking. He does not have any smokeless tobacco  history on file. He reports current alcohol use. He reports that he does not use drugs.  Allergies[1]  No family history on file.  Prior to Admission medications  Medication Sig Start Date End Date Taking? Authorizing Provider  acetaminophen  (TYLENOL ) 500 MG tablet Take 1,000 mg by mouth every 6 (six) hours as needed for moderate pain.   Yes [provider]  albuterol (PROVENTIL HFA;VENTOLIN HFA) 108 (90 Base) MCG/ACT inhaler Inhale 2 puffs into the lungs every 6 (six) hours as needed for wheezing or shortness of breath.   Yes [provider]  amoxicillin-clavulanate (AUGMENTIN) 875-125 MG tablet Take 1 tablet by mouth 2 (two) times daily. Patient not taking: Reported on 06/08/2024 06/07/24   [provider]    Physical Exam: Vitals:   06/08/24 1115 06/08/24 1235 06/08/24 1242 06/08/24 1400  BP: 121/71 113/74  (!) 153/85  Pulse: (!) 101 (!) 102  (!) 116  Resp: 14 (!) 22  (!) 30  Temp: (P) 98 F (36.7 C)  99 F (37.2 C)   TempSrc: (P) Oral  Axillary   SpO2: 96% 96%  100%  Weight:      Height:       General: lethargic and barely awakens to sternal rub Respiratory: Lungs clear to auscultation bilaterally with normal respiratory effort; no w/r/r Cardiovascular: Regular rate and rhythm w/o m/r/g   Data Reviewed:  Lab Results  Component Value Date   WBC 20.1 (H) 06/07/2024   HGB 16.3 06/07/2024   HCT 48.0 06/07/2024   MCV 84.4 06/07/2024   PLT 108 (L) 06/07/2024   Lab Results  Component Value Date   GLUCOSE 139 (H) 06/07/2024   CALCIUM 9.0  06/07/2024   NA 130 (L) 06/07/2024   K 4.1 06/07/2024   CO2 25 06/07/2024   CL 94 (L) 06/07/2024   BUN 37 (H) 06/07/2024   CREATININE 1.10 06/07/2024   Lab Results  Component Value Date   ALT 71 (H) 06/07/2024   AST 144 (H) 06/07/2024   GGT 68 (H) 06/08/2024   ALKPHOS 181 (H) 06/07/2024   BILITOT 0.9 06/07/2024   Lab Results  Component Value Date   INR 1.3 (H) 06/07/2024   INR 1.20 10/05/2015    Radiology: ECHOCARDIOGRAM COMPLETE Result Date: 06/08/2024    ECHOCARDIOGRAM REPORT   Patient Name:   Patrick [name] Date of Exam: 06/08/2024 Medical Rec #:  [id number]     Height:       71.0 in Accession #:    [id number]    Weight:       174.6 lb Date of Birth:  1981/03/14     BSA:          1.990 m Patient Age:    43 years      BP:           117/81 mmHg Patient Gender: M             HR:           105 bpm. Exam Location:  Inpatient Procedure: 2D Echo, Cardiac Doppler, Color Doppler and Saline Contrast Bubble            Study (Both Spectral and Color Flow Doppler were utilized during            procedure). Indications:    Stroke I63.9  History:        Patient has no prior history of Echocardiogram examinations.  Sonographer:    Jayson Gaskins Referring Phys: [id number] ROCKY JAYSON LIKES IMPRESSIONS  1. Cannot R/O oscillating density on aortic valve; suggest TEE to further assess.  2. Left ventricular ejection fraction, by estimation, is 60 to 65%. The left ventricle has normal function. The left ventricle has no regional wall motion abnormalities. Left ventricular diastolic parameters were normal.  3. Right ventricular systolic function is normal. The right ventricular size is normal.  4. The mitral valve is normal in structure. Trivial mitral valve regurgitation. No evidence of mitral stenosis.  5. The aortic valve is tricuspid. Aortic valve regurgitation is not visualized. No aortic stenosis is present.  6. The inferior vena cava is normal in size with greater than 50% respiratory variability, suggesting right atrial pressure of 3 mmHg.  7. Agitated saline contrast bubble study was negative, with no evidence of any interatrial shunt. Comparison(s): No prior Echocardiogram. FINDINGS  Left Ventricle: Left ventricular ejection fraction, by estimation, is 60 to 65%. The left ventricle has normal function. The left ventricle has no regional wall motion abnormalities. The left ventricular internal cavity size was normal in  size. There is  no left ventricular hypertrophy. Left ventricular diastolic parameters were normal. Right Ventricle: The right ventricular size is normal. Right ventricular systolic function is normal. Left Atrium: Left atrial size was normal in size. Right Atrium: Right atrial size was normal in size. Pericardium: There is no evidence of pericardial effusion. Mitral Valve: The mitral valve is normal in structure. Mild mitral annular calcification. Trivial mitral valve regurgitation. No evidence of mitral valve stenosis. Tricuspid Valve: The tricuspid valve is normal in structure. Tricuspid valve regurgitation is not demonstrated. No evidence of tricuspid stenosis. Aortic Valve: The aortic valve is tricuspid. Aortic valve regurgitation is not visualized.  No aortic stenosis is present. Aortic valve mean gradient measures 4.0 mmHg. Aortic valve peak gradient measures 6.2 mmHg. Aortic valve area, by VTI measures 3.98 cm. Pulmonic Valve: The pulmonic valve was not well visualized. Pulmonic valve regurgitation is not visualized. No evidence of pulmonic stenosis. Aorta: The aortic root is normal in size and structure. Venous: The inferior vena cava is normal in size with greater than 50% respiratory variability, suggesting right atrial pressure of 3 mmHg. IAS/Shunts: No atrial level shunt detected by color flow Doppler. Agitated saline contrast was given intravenously to evaluate for intracardiac shunting. Agitated saline contrast bubble study was negative, with no evidence of any interatrial shunt. Additional Comments: Cannot R/O oscillating density on aortic valve; suggest TEE to further assess.  LEFT VENTRICLE PLAX 2D LVIDd:         5.00 cm   Diastology LVIDs:         3.50 cm   LV e' lateral: 17.20 cm/s LV PW:         0.90 cm LV IVS:        0.80 cm LVOT diam:     2.07 cm LV SV:         72 LV SV Index:   36 LVOT Area:     3.37 cm  RIGHT VENTRICLE RV S prime:     13.90 cm/s TAPSE (M-mode): 2.5 cm LEFT ATRIUM              Index        RIGHT ATRIUM           Index LA Vol (A2C):   53.3 ml 26.78 ml/m  RA Area:     15.40 cm LA Vol (A4C):   26.6 ml 13.36 ml/m  RA Volume:   34.90 ml  17.53 ml/m LA Biplane Vol: 37.8 ml 18.99 ml/m  AORTIC VALVE AV Area (Vmax):    3.23 cm AV Area (Vmean):   3.10 cm AV Area (VTI):     3.98 cm AV Vmax:           125.00 cm/s AV Vmean:          90.600 cm/s AV VTI:            0.180 m AV Peak Grad:      6.2 mmHg AV Mean Grad:      4.0 mmHg LVOT Vmax:         120.00 cm/s LVOT Vmean:        83.400 cm/s LVOT VTI:          0.213 m LVOT/AV VTI ratio: 1.18  SHUNTS Systemic VTI:  0.21 m Systemic Diam: 2.07 cm Redell Shallow MD Electronically signed by Redell Shallow MD Signature Date/Time: 06/08/2024/12:45:38 PM    Final    MR BRAIN WO CONTRAST Result Date: 06/08/2024 EXAM: MRI BRAIN WITHOUT CONTRAST 06/08/2024 12:13:07 PM TECHNIQUE: Multiplanar multisequence MRI of the head/brain was performed without the administration of intravenous contrast. COMPARISON: CT of the head dated 06/07/2024. CLINICAL HISTORY: Neuro deficit, acute, stroke suspected. FINDINGS: BRAIN AND VENTRICLES: Restricted diffusion involving the right frontoparietal junction and insular region, corresponding with the area of diminished attenuation on prior head CT, compatible with acute/subacute nonhemorrhagic infarction. There is also a linear area of restricted diffusion present posteromedially within the left cerebellar hemisphere, also compatible with acute nonhemorrhagic infarct. There are additional foci of restricted diffusion within the left temporal lobe, compatible with small acute lacunar infarcts. An additional lesion is present anteromedially within the right  frontal lobe subcortical white matter. There is hemosiderin staining involving the right MCA distribution infarct, likely representing petechial oozing. There is also blooming artifact present posteromedially within the right occipital lobe. No mass. No midline shift. No  hydrocephalus. The sella is unremarkable. Normal flow voids. ORBITS: No acute abnormality. SINUSES AND MASTOIDS: Mucosal disease present within the ethmoid and maxillary sinuses. BONES AND SOFT TISSUES: Normal marrow signal. No acute soft tissue abnormality. IMPRESSION: 1. Acute/subacute infarction at the right frontoparietal junction/insular region (right MCA territory) with associated hemosiderin staining, most compatible with mild hemorrhagic transformation/oozing. 2. Acute infarct in the left cerebellar hemisphere. 3. Small acute lacunar infarcts in the left temporal lobe. 4. Additional focus in the anteromedial right frontal subcortical white matter, compatible with an additional acute infarct. 5. Blooming artifact in the posteromedial right occipital lobe, most consistent with a small chronic hemorrhagic focus (e.g., microhemorrhage). 6. Mucosal disease in the ethmoid and maxillary sinuses. Electronically signed by: Evalene Coho MD 06/08/2024 12:38 PM EST RP Workstation: [id number]   CT CHEST ABDOMEN PELVIS WO CONTRAST Result Date: 06/08/2024 EXAM: CT CHEST, ABDOMEN AND PELVIS WITHOUT CONTRAST 06/08/2024 02:52:24 AM TECHNIQUE: CT of the chest, abdomen and pelvis was performed without the administration of intravenous contrast. Multiplanar reformatted images are provided for review. Automated exposure control, iterative reconstruction, and/or weight based adjustment of the mA/kV was utilized to reduce the radiation dose to as low as reasonably achievable. COMPARISON: Comparison is made to prior examination of 06/26/2020. CLINICAL HISTORY: Sepsis. FINDINGS: CHEST: MEDIASTINUM AND LYMPH NODES: Heart and pericardium are unremarkable. Mild coronary artery calcification. The central airways are clear. No mediastinal, hilar or axillary lymphadenopathy. LUNGS AND PLEURA: Diffuse bronchial wall thickening noted in keeping with airway inflammation. Right posterior basal pulmonary consolidation suspicious for  acute lobar pneumonia in the appropriate clinical setting. Additional pleural based areas of reverse consolidation are identified within the peripheral right upper lobe and right middle lobe which can be seen in the setting of acute infection including atypical infection such as invasive fungal infection of the immunocompromised individual, septic embolization, or resolving pulmonary infarcts. In the acute setting, however, this is still most likely altogether reflective of multifocal bronchopneumonia. Correlation with the patient's immunocompetency status is warranted, however. Moderate airway impaction noted within the posterior base of segmental bronchi of the right lower lobe. Subpleural pulmonary nodule within the left lower lobe is stable since remote prior examination and is safely considered benign. Additional pulmonary nodules within the right upper lobe measuring up to 5 mm. Noncalcified pulmonary nodule within the left upper lobe (series 67, image 4) is indeterminate. Per Fleischner Society Guidelines recommend a non-contrast chest CT at 3-6 months, then consider another non-contrast chest CT at 18-24 months. If patient is low risk for malignancy, non-contrast chest CT at 18-24 months is optional. No pulmonary edema. No pleural effusion. No pneumothorax. ABDOMEN AND PELVIS: LIVER: Hepatomegaly, liver measuring 23 cm in craniocaudal dimension, appears new from prior examination. GALLBLADDER AND BILE DUCTS: Unremarkable. No biliary ductal dilatation. SPLEEN: Splenomegaly, spleen measuring 50.4 cm in greatest dimension, appears new from prior examination. PANCREAS: No acute abnormality. ADRENAL GLANDS: No acute abnormality. KIDNEYS, URETERS AND BLADDER: Marked atrophy of the right kidney and compensatory hypertrophy of the left kidney are again noted. Residual contrast from recent contrast enhanced examination is seen within the renal collecting system. The urinary bladder is moderately distended but is  otherwise unremarkable. No stones in the kidneys or ureters. No hydronephrosis. No perinephric or periureteral stranding. GI AND BOWEL: Stomach demonstrates  no acute abnormality. Moderate sigmoid diverticulosis. No superimposed acute inflammatory change. The small bowel and large bowel are otherwise unremarkable. There is no bowel obstruction. REPRODUCTIVE ORGANS: Moderate right scrotal hydrocele was partially visualized. PERITONEUM AND RETROPERITONEUM: No ascites. No free air. VASCULATURE: Aorta is normal in caliber. Mild aortoiliac atherosclerotic calcification. No aortic aneurysm. ABDOMINAL AND PELVIS LYMPH NODES: No lymphadenopathy. BONES AND SOFT TISSUES: No acute osseous abnormality. No focal soft tissue abnormality. IMPRESSION: 1. Right lower lobe posterior basal consolidation and additional right upper and middle lobe peripheral consolidations, most consistent with multifocal pneumonia. Additional considerations regarding the scattered areas of peripheral consolidation within the right upper and right middle lobe are as described above, and correlation with the patient's immunocompetency status is warranted. 2. Diffuse bronchial wall thickening with moderate mucus impaction in right lower lobe segmental bronchi, consistent with airway inflammation. 3. Hepatosplenomegaly, new from prior examination. 4. Indeterminate noncalcified left upper lobe pulmonary nodule, with recommendation for non-contrast chest CT at 3-6 months and then consideration of non-contrast chest CT at 18-24 months as per Fleischner Society Guidelines. 5. Marked atrophy of the right kidney with compensatory hypertrophy of the left kidney. 6. Moderate sigmoid diverticulosis without acute inflammatory change. 7. Mild coronary artery calcification and mild aortoiliac atherosclerotic calcification. 8. Partially visualized moderate right scrotal hydrocele. Electronically signed by: Dorethia Molt MD 06/08/2024 03:46 AM EST RP Workstation:  [id number]   DG Chest Portable 1 View Result Date: 06/08/2024 EXAM: 1 VIEW(S) XRAY OF THE CHEST 06/07/2024 11:44:39 PM COMPARISON: None available. CLINICAL HISTORY: eval for AMS FINDINGS: LUNGS AND PLEURA: Hypoinflated lungs. Bibasilar opacities, likely atelectasis. No pleural effusion. No pneumothorax. HEART AND MEDIASTINUM: No acute abnormality of the cardiac and mediastinal silhouettes. BONES AND SOFT TISSUES: No acute osseous abnormality. IMPRESSION: 1. Hypoinflated lungs with bibasilar opacities, likely atelectasis. Electronically signed by: Franky Stanford MD 06/08/2024 01:00 AM EST RP Workstation: [id number]   CT ANGIO HEAD NECK W WO CM Result Date: 06/08/2024 EXAM: CTA HEAD AND NECK WITHOUT AND WITH 06/08/2024 12:39:44 AM TECHNIQUE: CTA of the head and neck was performed without and with the administration of 75 mL of intravenous iohexol (OMNIPAQUE) 350 MG/ML injection. Multiplanar 2D and/or 3D reformatted images are provided for review. Automated exposure control, iterative reconstruction, and/or weight based adjustment of the mA/kV was utilized to reduce the radiation dose to as low as reasonably achievable. Stenosis of the internal carotid arteries measured using NASCET criteria. COMPARISON: None available CLINICAL HISTORY: Neuro deficit, acute, stroke suspected. FINDINGS: CTA NECK: AORTIC ARCH AND ARCH VESSELS: No dissection or arterial injury. No significant stenosis of the brachiocephalic or subclavian arteries. CERVICAL CAROTID ARTERIES: Atherosclerosis at the carotid bifurcations without hemodynamically significant stenosis. No dissection or arterial injury. CERVICAL VERTEBRAL ARTERIES: No dissection, arterial injury, or significant stenosis. LUNGS AND MEDIASTINUM: Unremarkable. SOFT TISSUES: No acute abnormality. BONES: No acute abnormality. CTA HEAD: ANTERIOR CIRCULATION: No significant stenosis of the internal carotid arteries. No significant stenosis of the anterior cerebral arteries. No  significant stenosis of the middle cerebral arteries. No aneurysm. POSTERIOR CIRCULATION: Fetal predominant origins of both posterior cerebral arteries. No significant stenosis of the posterior cerebral arteries. No significant stenosis of the basilar artery. No significant stenosis of the vertebral arteries. No aneurysm. OTHER: No dural venous sinus thrombosis on this non-dedicated study. Unchanged appearance of hypoattenuating ischemic region in the right MCA territory. IMPRESSION: 1. No large vessel occlusion, hemodynamically significant stenosis, or aneurysm in the head or neck. 2. Atherosclerosis at the carotid bifurcations without hemodynamically significant stenosis of the  internal carotid arteries. Electronically signed by: Franky Stanford MD 06/08/2024 12:57 AM EST RP Workstation: [id number]   CT HEAD CODE STROKE WO CONTRAST Result Date: 06/07/2024 EXAM: CT HEAD WITHOUT CONTRAST 06/07/2024 11:16:45 PM TECHNIQUE: CT of the head was performed without the administration of intravenous contrast. Automated exposure control, iterative reconstruction, and/or weight based adjustment of the mA/kV was utilized to reduce the radiation dose to as low as reasonably achievable. COMPARISON: None available. CLINICAL HISTORY: Neuro deficit, acute, stroke suspected. FINDINGS: BRAIN AND VENTRICLES: No acute hemorrhage. There is an intermediate-sized area of hypoattenuation within the right MCA territory affecting the insula and m2/m5 cortex. [place] Stroke Program Early CT Score (ASPECTS): Ganglionic (caudate, ic, lentiform nucleus, insula, M1-m3): 5. Supraganglionic (m4-m6): 2. Total: 8. No hydrocephalus. No extra-axial collection. No mass effect or midline shift. ORBITS: No acute abnormality. SINUSES: No acute abnormality. SOFT TISSUES AND SKULL: No acute soft tissue abnormality. No skull fracture. IMPRESSION: 1. Intermediate-sized area of hypoattenuation within the right MCA territory affecting the insula and M2/M5  cortex, consistent with acute ischemic changes. 2. ASPECTS score is 7. Findings communicated to Dr. Salman Khaliqdina at 11:23 PM on 06/07/2024. Electronically signed by: Franky Stanford MD 06/07/2024 11:24 PM EST RP Workstation: [id number]    Assessment and Plan: 15M h/o IVDU who p/w L facial droop and LUE weakness and found to have acute/subacute stroke on MRI c/b acute opiate withdrawal, MRSA bacteremia, and possible AV vegetation on TTE.  Acute stroke -Neuro following; appreciate eval/recs -PT/OT/SLP following; appreciate recs -Cardiac telemetry; consider 30d holter monitor at d/c if no arrhythmias captures -Allow permissive HTN (220/120 due to acute stroke) -Risk factor modification -Frequent neuro checks per protocol -F/u HgbA1c, fasting lipid panel -F/u TTE  Acute opiate withdrawal -CCM consulted given worsening lethargy; apprec eval/recs for higher level care -Suboxone 8-2mg  TID prn COWS >13  MRSA bacteremia -IV vancomycin per pharmacy protocol for now -F/u blood cultures q24h until clear -TEE given possible AV vegetation per below  Multifocal PNA -ID consulted; apprec eval/recs -IV vancomycin per above  Possible IE given c/f AV vegetation on TTE -Cards following; apprec eval/recs (TEE planned for 1/5) -Cultures and abx per above   Advance Care Planning:   Code Status: Full Code   Consults: Cards and Neurology  Family Communication: N/A  Severity of Illness: The appropriate patient status for this patient is INPATIENT. Inpatient status is judged to be reasonable and necessary in order to provide the required intensity of service to ensure the patient's safety. The patient's presenting symptoms, physical exam findings, and initial radiographic and laboratory data in the context of their chronic comorbidities is felt to place them at high risk for further clinical deterioration. Furthermore, it is not anticipated that the patient will be medically stable for discharge from  the hospital within 2 midnights of admission.   * I certify that at the point of admission it is my clinical judgment that the patient will require inpatient hospital care spanning beyond 2 midnights from the point of admission due to high intensity of service, high risk for further deterioration and high frequency of surveillance required.*   ------- I spent 55 minutes reviewing previous notes, at the bedside counseling/discussing the treatment plan, and performing clinical documentation.  Author: Marsha Ada, MD 06/08/2024 2:22 PM  For on call review [email or web address].      [1] No Known Allergies  "

## 2024-06-08 NOTE — ED Notes (Signed)
 Attempted to in and out cath pt with no success. EDP notified and was advised to wait until IV fluids are given to pt before trying again.

## 2024-06-08 NOTE — Progress Notes (Signed)
 Patient gave verbal consent to update his daughter, Seville Downs, on his plan of care. Spoke with daughter, added contact information to chart. Also updated patient's brother, Daquann Merriott contact information.

## 2024-06-08 NOTE — Progress Notes (Signed)
  Inpatient Rehab Admissions Coordinator :  Per therapy recommendations patient was screened for CIR candidacy by Ottie Glazier RN MSN. Patient is not at a level to tolerate the intensity required to pursue a CIR admit . The CIR admissions team will follow and monitor for progress and place a Rehab Consult order if felt to be appropriate. Please contact me with any questions.  Ottie Glazier RN MSN Admissions Coordinator [phone]

## 2024-06-08 NOTE — Consult Note (Signed)
 "  NAME:  Patrick [name], MRN:  [id number], DOB:  Apr 11, 1981, LOS: 0 ADMISSION DATE:  06/07/2024, CONSULTATION DATE:  06/08/24 REFERRING MD:  Georgina CHIEF COMPLAINT:  AMS   History of Present Illness:  Patrick [name] is a 43 y.o. male who has a PMH including but not limited to polysubstance abuse and IVDU with apparent meth use daily.  He presented to [facility] ED on 12/30 with altered mental status.  A friend had called 911 for confusion and he  was initially brought in as a code stroke for left-sided facial droop and left arm weakness.    And route, he received 1 mg of Narcan with improvement in his mental status upon arrival to the ED.  He did remain drowsy but was able to follow commands and answer basic questions.  He had informed neurology team that he had felt terrible for the last 5 days.  He had apparently been seen at [facility] the night before and was discharged.  ED, he had a CT head that showed right MCA distribution stroke.  Deemed to not be a candidate for TNKase or thrombectomy.  UDS was positive for amphetamines and fentanyl . MRI of the brain was obtained and demonstrated acute/subacute infarct at the right frontoparietal junction/insular region (right MCA territory) with with possible small hemorrhagic transformation, acute infarct in the left cerebellar hemisphere, small acute lacunar infarcts in the left temporal lobe, additional acute infarct in the anteromedial right frontal subcortical white matter, blooming artifact in the posterior dural medial right occipital lobe most consistent with a small chronic microhemorrhage.  He was admitted by the hospitalist team and after his admission, blood cultures were positive for GPC in clusters with MRSA detected.  Antibiotic regimen was adjusted from Vanco and cefepime to Vanco only.  Overnight, he had received 0.4mg  Narcan with some improvement per ED RN staff. An echo was obtained and could not rule out oscillating density  on the aortic valve.  Midday 12/31, he continued to have altered mental status.  PCCM was subsequently consulted for consideration of ICU admission.  The time my evaluation, he is somnolent but is easily arousable to voice only.  He awakens and is able to tell me his name and where he is.  I did administer 1 mg of Narcan to which he had noticeable improvement.  Thereafter, he had moaning and moving his right extremities more so than the left.  He was trying to say some words that were incomprehensible.  Pertinent  Medical History:  has Acute encephalopathy on their problem list.  Significant Hospital Events: Including procedures, antibiotic start and stop dates in addition to other pertinent events   12/31 admit  Interim History / Subjective:  Vitals stable. More alert after 1mg  Narcan but was arouseable prior and able to tell me his name and where he is.  Objective:  Blood pressure (!) 133/98, pulse (!) 116, temperature 98.2 F (36.8 C), temperature source Oral, resp. rate (!) 36, height 5' 11 (1.803 m), weight 79.2 kg, SpO2 100%.        Intake/Output Summary (Last 24 hours) at 06/08/2024 1449 Last data filed at 06/08/2024 1014 Gross per 24 hour  Intake --  Output 700 ml  Net -700 ml   Filed Weights   06/07/24 2300 06/07/24 2338  Weight: 79.2 kg 79.2 kg     Physical Exam: General: Adult male, appears much older then stated age, in NAD. Neuro: Somnolent but does arouse to voice, able to  tell me his name and where he is. Moves all 4 extremities but right > left (LUE weakest). HEENT: [place]/AT. Sclerae anicteric. MM very dry. Cardiovascular: Tachy, regular, no M/R/G.  Lungs: Respirations even and unlabored.  CTA bilaterally, No W/R/R. Abdomen: BS x 4, soft, NT/ND.  Musculoskeletal: No gross deformities, some bruising and excoriations noted to bilateral feet and distal legs, some erythema to left dorsum of foot.   Assessment & Plan:   Polysubstance abuse with IVDU - UDS  positive for Fentanyl  + Amphetamines. Quite a significant response to 1mg  Narcan in ED. Tobacco dependence. - Narcan PRN. - Does not need ICU currently. - Monitor for withdrawal. - Substance abuse and tobacco cessation counseling.  MRSA Bacteremia - in setting of IVDU.  ? Endocarditis - echo could not r/o oscillating density on the aortic valve, otherwise was normal. - Continue Vancomycin. - Needs TEE, primary team to consult cardiology. - ID following.  Probable multifocal PNA - ? Septic emboli. LUL nodule. - Vanc as above. - Might need to add on gram negative and anaerobe coverage depending on course. ID following, will defer for now. - Repeat CT in 3 - 6 months.  Possible cellulitis of LLE. - Continue Vanc as above.  Multiple acute/subacute infarcts - Neurology following/managing. - Permissive HTN for now, goal < 220/110 per neuro. - PT/OT/SLP when able.  Hyponatremia, hypochloremia - hypovolemic. - Aggressive fluids. - Follow BMP.   PCCM available as needed. Please call if we can be of further assistance.  Labs   CBC: Recent Labs  Lab 06/07/24 2331 06/07/24 2334 06/07/24 2335  WBC 20.1*  --   --   NEUTROABS 16.9*  --   --   HGB 15.1 15.6 16.3  HCT 43.4 46.0 48.0  MCV 84.4  --   --   PLT 108*  --   --     Basic Metabolic Panel: Recent Labs  Lab 06/07/24 2331 06/07/24 2334 06/07/24 2335  NA 128* 129* 130*  K 4.5 4.1 4.1  CL 91*  --  94*  CO2 25  --   --   GLUCOSE 143*  --  139*  BUN 36*  --  37*  CREATININE 1.00  --  1.10  CALCIUM 9.0  --   --    GFR: Estimated Creatinine Clearance: 93.2 mL/min (by C-G formula based on SCr of 1.1 mg/dL). Recent Labs  Lab 06/07/24 2331 06/08/24 0123 06/08/24 0337 06/08/24 0513 06/08/24 0828  PROCALCITON  --   --   --  3.05  --   WBC 20.1*  --   --   --   --   LATICACIDVEN  --  2.5* 2.6*  --  1.5    Liver Function Tests: Recent Labs  Lab 06/07/24 2331  AST 144*  ALT 71*  ALKPHOS 181*  BILITOT 0.9   PROT 8.1  ALBUMIN 3.3*   No results for input(s): LIPASE, AMYLASE in the last 168 hours. Recent Labs  Lab 06/07/24 2331  AMMONIA 28    ABG    Component Value Date/Time   HCO3 26.6 06/08/2024 1319   TCO2 25 06/07/2024 2335   O2SAT 94.8 06/08/2024 1319     Coagulation Profile: Recent Labs  Lab 06/07/24 2331  INR 1.3*    Cardiac Enzymes: No results for input(s): CKTOTAL, CKMB, CKMBINDEX, TROPONINI in the last 168 hours.  HbA1C: Hgb A1c MFr Bld  Date/Time Value Ref Range Status  06/08/2024 09:03 AM 6.0 (H) 4.8 - 5.6 % Final  Comment:    (NOTE) Diagnosis of Diabetes The following HbA1c ranges recommended by the American Diabetes Association (ADA) may be used as an aid in the diagnosis of diabetes mellitus.  Hemoglobin             Suggested A1C NGSP%              Diagnosis  <5.7                   Non Diabetic  5.7-6.4                Pre-Diabetic  >6.4                   Diabetic  <7.0                   Glycemic control for                       adults with diabetes.      CBG: Recent Labs  Lab 06/08/24 0006  GLUCAP 161*    Review of Systems:   Unable to obtain at this time as pt is encephalopathic.  Past Medical History:  He,  has a past medical history of Arthritis.   Surgical History:  No past surgical history on file.   Social History:   reports that he has been smoking. He does not have any smokeless tobacco history on file. He reports current alcohol use. He reports that he does not use drugs.   Family History:  His family history is not on file.   Allergies Allergies[1]   Home Medications  Prior to Admission medications  Medication Sig Start Date End Date Taking? Authorizing Provider  acetaminophen  (TYLENOL ) 500 MG tablet Take 1,000 mg by mouth every 6 (six) hours as needed for moderate pain.   Yes [provider]  albuterol (PROVENTIL HFA;VENTOLIN HFA) 108 (90 Base) MCG/ACT inhaler Inhale 2 puffs into the lungs  every 6 (six) hours as needed for wheezing or shortness of breath.   Yes [provider]  amoxicillin-clavulanate (AUGMENTIN) 875-125 MG tablet Take 1 tablet by mouth 2 (two) times daily. Patient not taking: Reported on 06/08/2024 06/07/24   [provider]      Sammi Gore, PA - [facility] For pager details, please see AMION or use Epic chat  After 1900, please call [facility] for cross coverage needs 06/08/2024, 2:49 PM       [1] No Known Allergies  "

## 2024-06-08 NOTE — Progress Notes (Addendum)
 STROKE TEAM PROGRESS NOTE    SIGNIFICANT HOSPITAL EVENTS  43 y.o. male BIB EMS as a code stroke for L facial droop, L arm weakness. Patient poorly responsive enroute and being bagged but woke up with Narcan 1mg .  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 He was outside tnkase window and not offered thrombectomy due to infarcted tissue appearance on CT being suggestive of irreversibly damaged tissue. UDS positive for amphetamines and fentanyl .  INTERIM HISTORY/SUBJECTIVE  Echo today shows concern for endocarditis, with questionable oscillating density on aortic valve. Patient admits to daily IVDA. Blood cultures back positive for MRSA, on Vanco.   On exam, patient is lethargic but able to answer questions with severely dysarthric voice.  Follows simple commands.  Left arm weakness, left facial droop continues.Patient states these symptoms started this weekend, reflective of subacute stroke seen on imaging.    OBJECTIVE  CBC    Component Value Date/Time   WBC 20.1 (H) 06/07/2024 2331   RBC 5.14 06/07/2024 2331   HGB 16.3 06/07/2024 2335   HCT 48.0 06/07/2024 2335   PLT 108 (L) 06/07/2024 2331   MCV 84.4 06/07/2024 2331   MCH 29.4 06/07/2024 2331   MCHC 34.8 06/07/2024 2331   RDW 13.7 06/07/2024 2331   LYMPHSABS 1.0 06/07/2024 2331   MONOABS 1.7 (H) 06/07/2024 2331   EOSABS 0.0 06/07/2024 2331   BASOSABS 0.1 06/07/2024 2331    BMET    Component Value Date/Time   NA 130 (L) 06/07/2024 2335   K 4.1 06/07/2024 2335   CL 94 (L) 06/07/2024 2335   CO2 25 06/07/2024 2331   GLUCOSE 139 (H) 06/07/2024 2335   BUN 37 (H) 06/07/2024 2335   CREATININE 1.10 06/07/2024 2335   CALCIUM 9.0 06/07/2024 2331   GFRNONAA >60 06/07/2024 2331    IMAGING past 24 hours ECHOCARDIOGRAM COMPLETE Result Date: 06/08/2024    ECHOCARDIOGRAM REPORT   Patient Name:   Patrick [name] Date of Exam: 06/08/2024 Medical Rec #:  [id number]     Height:       71.0 in Accession #:    [id number]     Weight:       174.6 lb Date of Birth:  1980-06-28     BSA:          1.990 m Patient Age:    43 years      BP:           117/81 mmHg Patient Gender: M             HR:           105 bpm. Exam Location:  Inpatient Procedure: 2D Echo, Cardiac Doppler, Color Doppler and Saline Contrast Bubble            Study (Both Spectral and Color Flow Doppler were utilized during            procedure). Indications:    Stroke I63.9  History:        Patient has no prior history of Echocardiogram examinations.  Sonographer:    Jayson Gaskins Referring Phys: [id number] ROCKY JAYSON LIKES IMPRESSIONS  1. Cannot R/O oscillating density on aortic valve; suggest TEE to further assess.  2. Left ventricular ejection fraction, by estimation, is 60 to 65%. The left ventricle has normal function. The left ventricle has no regional wall motion abnormalities. Left ventricular diastolic parameters were normal.  3. Right ventricular systolic function is normal. The right ventricular size is normal.  4. The mitral  valve is normal in structure. Trivial mitral valve regurgitation. No evidence of mitral stenosis.  5. The aortic valve is tricuspid. Aortic valve regurgitation is not visualized. No aortic stenosis is present.  6. The inferior vena cava is normal in size with greater than 50% respiratory variability, suggesting right atrial pressure of 3 mmHg.  7. Agitated saline contrast bubble study was negative, with no evidence of any interatrial shunt. Comparison(s): No prior Echocardiogram. FINDINGS  Left Ventricle: Left ventricular ejection fraction, by estimation, is 60 to 65%. The left ventricle has normal function. The left ventricle has no regional wall motion abnormalities. The left ventricular internal cavity size was normal in size. There is  no left ventricular hypertrophy. Left ventricular diastolic parameters were normal. Right Ventricle: The right ventricular size is normal. Right ventricular systolic function is normal. Left Atrium: Left atrial size  was normal in size. Right Atrium: Right atrial size was normal in size. Pericardium: There is no evidence of pericardial effusion. Mitral Valve: The mitral valve is normal in structure. Mild mitral annular calcification. Trivial mitral valve regurgitation. No evidence of mitral valve stenosis. Tricuspid Valve: The tricuspid valve is normal in structure. Tricuspid valve regurgitation is not demonstrated. No evidence of tricuspid stenosis. Aortic Valve: The aortic valve is tricuspid. Aortic valve regurgitation is not visualized. No aortic stenosis is present. Aortic valve mean gradient measures 4.0 mmHg. Aortic valve peak gradient measures 6.2 mmHg. Aortic valve area, by VTI measures 3.98 cm. Pulmonic Valve: The pulmonic valve was not well visualized. Pulmonic valve regurgitation is not visualized. No evidence of pulmonic stenosis. Aorta: The aortic root is normal in size and structure. Venous: The inferior vena cava is normal in size with greater than 50% respiratory variability, suggesting right atrial pressure of 3 mmHg. IAS/Shunts: No atrial level shunt detected by color flow Doppler. Agitated saline contrast was given intravenously to evaluate for intracardiac shunting. Agitated saline contrast bubble study was negative, with no evidence of any interatrial shunt. Additional Comments: Cannot R/O oscillating density on aortic valve; suggest TEE to further assess.  LEFT VENTRICLE PLAX 2D LVIDd:         5.00 cm   Diastology LVIDs:         3.50 cm   LV e' lateral: 17.20 cm/s LV PW:         0.90 cm LV IVS:        0.80 cm LVOT diam:     2.07 cm LV SV:         72 LV SV Index:   36 LVOT Area:     3.37 cm  RIGHT VENTRICLE RV S prime:     13.90 cm/s TAPSE (M-mode): 2.5 cm LEFT ATRIUM             Index        RIGHT ATRIUM           Index LA Vol (A2C):   53.3 ml 26.78 ml/m  RA Area:     15.40 cm LA Vol (A4C):   26.6 ml 13.36 ml/m  RA Volume:   34.90 ml  17.53 ml/m LA Biplane Vol: 37.8 ml 18.99 ml/m  AORTIC VALVE AV  Area (Vmax):    3.23 cm AV Area (Vmean):   3.10 cm AV Area (VTI):     3.98 cm AV Vmax:           125.00 cm/s AV Vmean:          90.600 cm/s AV VTI:  0.180 m AV Peak Grad:      6.2 mmHg AV Mean Grad:      4.0 mmHg LVOT Vmax:         120.00 cm/s LVOT Vmean:        83.400 cm/s LVOT VTI:          0.213 m LVOT/AV VTI ratio: 1.18  SHUNTS Systemic VTI:  0.21 m Systemic Diam: 2.07 cm Redell Shallow MD Electronically signed by Redell Shallow MD Signature Date/Time: 06/08/2024/12:45:38 PM    Final    MR BRAIN WO CONTRAST Result Date: 06/08/2024 EXAM: MRI BRAIN WITHOUT CONTRAST 06/08/2024 12:13:07 PM TECHNIQUE: Multiplanar multisequence MRI of the head/brain was performed without the administration of intravenous contrast. COMPARISON: CT of the head dated 06/07/2024. CLINICAL HISTORY: Neuro deficit, acute, stroke suspected. FINDINGS: BRAIN AND VENTRICLES: Restricted diffusion involving the right frontoparietal junction and insular region, corresponding with the area of diminished attenuation on prior head CT, compatible with acute/subacute nonhemorrhagic infarction. There is also a linear area of restricted diffusion present posteromedially within the left cerebellar hemisphere, also compatible with acute nonhemorrhagic infarct. There are additional foci of restricted diffusion within the left temporal lobe, compatible with small acute lacunar infarcts. An additional lesion is present anteromedially within the right frontal lobe subcortical white matter. There is hemosiderin staining involving the right MCA distribution infarct, likely representing petechial oozing. There is also blooming artifact present posteromedially within the right occipital lobe. No mass. No midline shift. No hydrocephalus. The sella is unremarkable. Normal flow voids. ORBITS: No acute abnormality. SINUSES AND MASTOIDS: Mucosal disease present within the ethmoid and maxillary sinuses. BONES AND SOFT TISSUES: Normal marrow signal. No  acute soft tissue abnormality. IMPRESSION: 1. Acute/subacute infarction at the right frontoparietal junction/insular region (right MCA territory) with associated hemosiderin staining, most compatible with mild hemorrhagic transformation/oozing. 2. Acute infarct in the left cerebellar hemisphere. 3. Small acute lacunar infarcts in the left temporal lobe. 4. Additional focus in the anteromedial right frontal subcortical white matter, compatible with an additional acute infarct. 5. Blooming artifact in the posteromedial right occipital lobe, most consistent with a small chronic hemorrhagic focus (e.g., microhemorrhage). 6. Mucosal disease in the ethmoid and maxillary sinuses. Electronically signed by: Evalene Coho MD 06/08/2024 12:38 PM EST RP Workstation: [id number]   CT CHEST ABDOMEN PELVIS WO CONTRAST Result Date: 06/08/2024 EXAM: CT CHEST, ABDOMEN AND PELVIS WITHOUT CONTRAST 06/08/2024 02:52:24 AM TECHNIQUE: CT of the chest, abdomen and pelvis was performed without the administration of intravenous contrast. Multiplanar reformatted images are provided for review. Automated exposure control, iterative reconstruction, and/or weight based adjustment of the mA/kV was utilized to reduce the radiation dose to as low as reasonably achievable. COMPARISON: Comparison is made to prior examination of 06/26/2020. CLINICAL HISTORY: Sepsis. FINDINGS: CHEST: MEDIASTINUM AND LYMPH NODES: Heart and pericardium are unremarkable. Mild coronary artery calcification. The central airways are clear. No mediastinal, hilar or axillary lymphadenopathy. LUNGS AND PLEURA: Diffuse bronchial wall thickening noted in keeping with airway inflammation. Right posterior basal pulmonary consolidation suspicious for acute lobar pneumonia in the appropriate clinical setting. Additional pleural based areas of reverse consolidation are identified within the peripheral right upper lobe and right middle lobe which can be seen in the setting of  acute infection including atypical infection such as invasive fungal infection of the immunocompromised individual, septic embolization, or resolving pulmonary infarcts. In the acute setting, however, this is still most likely altogether reflective of multifocal bronchopneumonia. Correlation with the patient's immunocompetency status is warranted, however. Moderate airway impaction  noted within the posterior base of segmental bronchi of the right lower lobe. Subpleural pulmonary nodule within the left lower lobe is stable since remote prior examination and is safely considered benign. Additional pulmonary nodules within the right upper lobe measuring up to 5 mm. Noncalcified pulmonary nodule within the left upper lobe (series 67, image 4) is indeterminate. Per Fleischner Society Guidelines recommend a non-contrast chest CT at 3-6 months, then consider another non-contrast chest CT at 18-24 months. If patient is low risk for malignancy, non-contrast chest CT at 18-24 months is optional. No pulmonary edema. No pleural effusion. No pneumothorax. ABDOMEN AND PELVIS: LIVER: Hepatomegaly, liver measuring 23 cm in craniocaudal dimension, appears new from prior examination. GALLBLADDER AND BILE DUCTS: Unremarkable. No biliary ductal dilatation. SPLEEN: Splenomegaly, spleen measuring 50.4 cm in greatest dimension, appears new from prior examination. PANCREAS: No acute abnormality. ADRENAL GLANDS: No acute abnormality. KIDNEYS, URETERS AND BLADDER: Marked atrophy of the right kidney and compensatory hypertrophy of the left kidney are again noted. Residual contrast from recent contrast enhanced examination is seen within the renal collecting system. The urinary bladder is moderately distended but is otherwise unremarkable. No stones in the kidneys or ureters. No hydronephrosis. No perinephric or periureteral stranding. GI AND BOWEL: Stomach demonstrates no acute abnormality. Moderate sigmoid diverticulosis. No superimposed  acute inflammatory change. The small bowel and large bowel are otherwise unremarkable. There is no bowel obstruction. REPRODUCTIVE ORGANS: Moderate right scrotal hydrocele was partially visualized. PERITONEUM AND RETROPERITONEUM: No ascites. No free air. VASCULATURE: Aorta is normal in caliber. Mild aortoiliac atherosclerotic calcification. No aortic aneurysm. ABDOMINAL AND PELVIS LYMPH NODES: No lymphadenopathy. BONES AND SOFT TISSUES: No acute osseous abnormality. No focal soft tissue abnormality. IMPRESSION: 1. Right lower lobe posterior basal consolidation and additional right upper and middle lobe peripheral consolidations, most consistent with multifocal pneumonia. Additional considerations regarding the scattered areas of peripheral consolidation within the right upper and right middle lobe are as described above, and correlation with the patient's immunocompetency status is warranted. 2. Diffuse bronchial wall thickening with moderate mucus impaction in right lower lobe segmental bronchi, consistent with airway inflammation. 3. Hepatosplenomegaly, new from prior examination. 4. Indeterminate noncalcified left upper lobe pulmonary nodule, with recommendation for non-contrast chest CT at 3-6 months and then consideration of non-contrast chest CT at 18-24 months as per Fleischner Society Guidelines. 5. Marked atrophy of the right kidney with compensatory hypertrophy of the left kidney. 6. Moderate sigmoid diverticulosis without acute inflammatory change. 7. Mild coronary artery calcification and mild aortoiliac atherosclerotic calcification. 8. Partially visualized moderate right scrotal hydrocele. Electronically signed by: Dorethia Molt MD 06/08/2024 03:46 AM EST RP Workstation: [id number]   DG Chest Portable 1 View Result Date: 06/08/2024 EXAM: 1 VIEW(S) XRAY OF THE CHEST 06/07/2024 11:44:39 PM COMPARISON: None available. CLINICAL HISTORY: eval for AMS FINDINGS: LUNGS AND PLEURA: Hypoinflated lungs.  Bibasilar opacities, likely atelectasis. No pleural effusion. No pneumothorax. HEART AND MEDIASTINUM: No acute abnormality of the cardiac and mediastinal silhouettes. BONES AND SOFT TISSUES: No acute osseous abnormality. IMPRESSION: 1. Hypoinflated lungs with bibasilar opacities, likely atelectasis. Electronically signed by: Franky Stanford MD 06/08/2024 01:00 AM EST RP Workstation: [id number]   CT ANGIO HEAD NECK W WO CM Result Date: 06/08/2024 EXAM: CTA HEAD AND NECK WITHOUT AND WITH 06/08/2024 12:39:44 AM TECHNIQUE: CTA of the head and neck was performed without and with the administration of 75 mL of intravenous iohexol (OMNIPAQUE) 350 MG/ML injection. Multiplanar 2D and/or 3D reformatted images are provided for review. Automated exposure control, iterative reconstruction,  and/or weight based adjustment of the mA/kV was utilized to reduce the radiation dose to as low as reasonably achievable. Stenosis of the internal carotid arteries measured using NASCET criteria. COMPARISON: None available CLINICAL HISTORY: Neuro deficit, acute, stroke suspected. FINDINGS: CTA NECK: AORTIC ARCH AND ARCH VESSELS: No dissection or arterial injury. No significant stenosis of the brachiocephalic or subclavian arteries. CERVICAL CAROTID ARTERIES: Atherosclerosis at the carotid bifurcations without hemodynamically significant stenosis. No dissection or arterial injury. CERVICAL VERTEBRAL ARTERIES: No dissection, arterial injury, or significant stenosis. LUNGS AND MEDIASTINUM: Unremarkable. SOFT TISSUES: No acute abnormality. BONES: No acute abnormality. CTA HEAD: ANTERIOR CIRCULATION: No significant stenosis of the internal carotid arteries. No significant stenosis of the anterior cerebral arteries. No significant stenosis of the middle cerebral arteries. No aneurysm. POSTERIOR CIRCULATION: Fetal predominant origins of both posterior cerebral arteries. No significant stenosis of the posterior cerebral arteries. No significant  stenosis of the basilar artery. No significant stenosis of the vertebral arteries. No aneurysm. OTHER: No dural venous sinus thrombosis on this non-dedicated study. Unchanged appearance of hypoattenuating ischemic region in the right MCA territory. IMPRESSION: 1. No large vessel occlusion, hemodynamically significant stenosis, or aneurysm in the head or neck. 2. Atherosclerosis at the carotid bifurcations without hemodynamically significant stenosis of the internal carotid arteries. Electronically signed by: Franky Stanford MD 06/08/2024 12:57 AM EST RP Workstation: [id number]   CT HEAD CODE STROKE WO CONTRAST Result Date: 06/07/2024 EXAM: CT HEAD WITHOUT CONTRAST 06/07/2024 11:16:45 PM TECHNIQUE: CT of the head was performed without the administration of intravenous contrast. Automated exposure control, iterative reconstruction, and/or weight based adjustment of the mA/kV was utilized to reduce the radiation dose to as low as reasonably achievable. COMPARISON: None available. CLINICAL HISTORY: Neuro deficit, acute, stroke suspected. FINDINGS: BRAIN AND VENTRICLES: No acute hemorrhage. There is an intermediate-sized area of hypoattenuation within the right MCA territory affecting the insula and m2/m5 cortex. [place] Stroke Program Early CT Score (ASPECTS): Ganglionic (caudate, ic, lentiform nucleus, insula, M1-m3): 5. Supraganglionic (m4-m6): 2. Total: 8. No hydrocephalus. No extra-axial collection. No mass effect or midline shift. ORBITS: No acute abnormality. SINUSES: No acute abnormality. SOFT TISSUES AND SKULL: No acute soft tissue abnormality. No skull fracture. IMPRESSION: 1. Intermediate-sized area of hypoattenuation within the right MCA territory affecting the insula and M2/M5 cortex, consistent with acute ischemic changes. 2. ASPECTS score is 7. Findings communicated to Dr. Salman Khaliqdina at 11:23 PM on 06/07/2024. Electronically signed by: Franky Stanford MD 06/07/2024 11:24 PM EST RP Workstation:  [id number]    Vitals:   06/08/24 0845 06/08/24 1115 06/08/24 1235 06/08/24 1242  BP:  121/71 113/74   Pulse:  (!) 101 (!) 102   Resp:  14 (!) 22   Temp: 98.1 F (36.7 C) (P) 98 F (36.7 C)  99 F (37.2 C)  TempSrc:  (P) Oral  Axillary  SpO2:  96% 96%   Weight:      Height:         PHYSICAL EXAM General:  Lethargic, acutely ill patient CV: Regular rate and rhythm on monitor Respiratory:  Regular, unlabored respirations on room air  NEURO:  Mental Status: Lethargic, but awakens to voice. Fairly interactive. Able to follow simple commands.  Speech/Language: unable to speak more than a couple of words in severely dysarthric voice. Is able to able simple questions.   Cranial Nerves:  II: PERRL. Difficult to perform full VF testing. Blinks to threat bilaterally.  III, IV, VI: EOMI. Eyelids elevate symmetrically.  V: Sensation is intact to light  touch and symmetrical to face.  VII: Right facial droop VIII: hearing intact to voice. IX, X: Palate elevates symmetrically. Severe dysarthria.  KP:Dynloizm shrug 5/5. XII: tongue is midline without fasciculations. Motor:  LUE: moderate drift, 3/5 Generalized weakness.  RUE: slight drift, 4-/5 BLE: slight drift, 4-/5 Tone: is normal and bulk is normal Sensation- Intact to light touch bilaterally. Extinction absent to light touch to DSS.   Coordination: FTN slow but intact bilaterally Gait- deferred  Most Recent NIH 10     ASSESSMENT/PLAN  [name] is a  43 y.o. male who is brought in as a code stroke for L facial droop, L arm weakness. There is concern for substance use. Patient poorly responsive enroute and being bagged but woke up with Narcan 1mg . Neuro exam with drowsiness, L facial droop and L arm weakness and L leg weakness. 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 He was outside tnkase window and not offered thrombectomy due to infarcted tissue appearance on CT being suggestive of irreversibly damaged  tissue. NIH on Admission 7.  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 ASPECTS score is 7. CTA head & neck  No large vessel occlusion, hemodynamically significant stenosis, or aneurysm in the head or neck. Atherosclerosis at the carotid bifurcations without hemodynamically significant stenosis of the internal carotid arteries. MRI   Acute/subacute infarction at the right frontoparietal junction/insular region (right MCA territory) with associated hemosiderin staining, most compatible with mild hemorrhagic transformation/oozing. Acute infarct in the left cerebellar hemisphere. Small acute lacunar infarcts in the left temporal lobe. Additional focus in the anteromedial right frontal subcortical white matter,compatible with an additional acute infarct. Blooming artifact in the posteromedial right occipital lobe, most consistent with a small chronic hemorrhagic focus (e.g., microhemorrhage). 2D Echo: Unable to rule out oscillating density on aortic valve, TEE suggested to further assess.  EF 60 to 65% TEE recommended, to evaluate for endocarditis given above findings, stroke pattern and patient's history of daily IVDA.  Discussed with attending LDL NOT CALCULATED HgbA1c 6.0 No antithrombotic prior to admission, now on No antithrombotic while evaluating for endocarditis Therapy recommendations:  Pending Disposition:  pending  Likely Septic 2/2 Bacteremia Pneumonia ? Left foot cellulitis Blood Cultures positive for MRSA Vanco continued Elevated Procal Elevated LA 2.5--2.6--1.5 Negative Resp Panel UA Negative Concern for vegetation on TTE, TEE recommended  Hypertension, no known history Home meds:  none Stable,  on low end Blood Pressure Goal: BP less than 220/110  Permissive hypertension for first 24 hours after symptom onset then gradually normalize.  Hyperlipidemia Home meds:  none LDL pending, goal < 70  Pre-Diabetic Home meds:  none HgbA1c 6.0, goal < 7.0 Recommend close follow-up with PCP   Tobacco Abuse Patient is a current smoker  Substance Abuse Patient uses meth daily, IV UDS positive for amphetamines and fentanyl  Revisit cessation discussion with improved mental status TOC consult for cessation placed  Dysphagia Patient has post-stroke dysphagia, SLP consulted    Diet   Diet NPO time specified   Advance diet as tolerated  Hospital day # 0   Pt seen by Neuro NP/APP with MD. Note/plan to be edited by MD as needed.    Rocky JAYSON Likes, DNP [facility] Please use AMION for contact information & EPIC for messaging.  I have personally obtained history,examined this patient, reviewed notes, independently viewed imaging studies, participated in medical decision making and plan of care.ROS completed by me personally and pertinent positives fully documented  I have made any additions or clarifications directly to the above note. Agree with note above.  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  Recommend antibiotics as per ID.  Aspirin alone for stroke prevention.  Aggressive risk factor modification.  Physical occupational speech therapy consults.  TEE to confirm aortic valve vegetation.  No family at the bedside   I personally spent a total of 50 minutes in the care of the patient today including getting/reviewing separately obtained history, performing a medically appropriate exam/evaluation, counseling and educating, placing orders, referring and  communicating with other health care professionals, documenting clinical information in the EHR, independently interpreting results, and coordinating care.         Eather Popp, MD Medical Director [facility] Pager: [phone] 06/08/2024 3:08 PM  To contact Stroke Continuity provider, please refer to [email or web address]. After hours, contact General Neurology

## 2024-06-08 NOTE — Progress Notes (Signed)
" °  Carryover admission to the Day Admitter.  I discussed this case with the EDP, Dr. Lorette.  Per these discussions:   This is a 43 year old male who is being admitted for acute encephalopathy, multifactorial in etiology, along with acute versus subacute ischemic stroke, severe sepsis due to community-acquired pneumonia, and potential fentanyl  overdose.   He originally presented to the ED as a code stroke, with right-sided facial droop, with unclear last known well, with EDP conveying that the most recent that last known well could have possibly have been was 23 hours ago.  EMS found the patient to be of limited responsiveness, and initially bagged the patient, with mental status also improving with dose of Narcan.  CT head showed evidence of acute versus subacute ischemic stroke.  On-call neurology, Dr. Vanessa, is consulting.  Urinary drug screen was positive for fentanyl .   CT imaging revealed evidence of pneumonia.   Vital signs notable for subjective fever, while presenting CBC reflects leukocytosis.  Initial lactate 2.5, with repeat trending up to 2.6.  Blood pressure has been stable, without any hypotension.  Patient has been not requiring any supplemental oxygen in the ED.  Regarding his underlying pneumonia, blood cultures x 2 were collected prior to initiation of IV vancomycin and cefepime.  He has received 2 L of normal saline as well as 1 L of lactated Ringer's.  He has received 1 dose of Narcan in the ED, following which mental status has improved.   EDP d/w patient's case with on-call PCCM, Dr. Layman, who feels that patient is okay for hospitalist admission to SDU.   A bedside RN swallow screen is being attempted at this time.  I have placed an order for inpatient admission to PCU/stepdown unit for further evaluation management of the above.  I have placed some additional preliminary admit orders via the adult multi-morbid admission order set. I have also ordered prn  Narcan, lactated Ringer's at 150 cc/h x 12 hours, repeat lactic acid level, procalcitonin level.  Also continued IV vancomycin and cefepime.  With continued existing order for every 4 hour neurochecks with NIH scale.  I will defer to the admitting hospitalist additional orders via  the focused acute ischemic stroke order set, including orders for mri brain.     Eva Pore, DO Hospitalist  "

## 2024-06-09 DIAGNOSIS — F17210 Nicotine dependence, cigarettes, uncomplicated: Secondary | ICD-10-CM

## 2024-06-09 DIAGNOSIS — I6381 Other cerebral infarction due to occlusion or stenosis of small artery: Secondary | ICD-10-CM | POA: Diagnosis not present

## 2024-06-09 DIAGNOSIS — R29710 NIHSS score 10: Secondary | ICD-10-CM | POA: Diagnosis not present

## 2024-06-09 DIAGNOSIS — A4102 Sepsis due to Methicillin resistant Staphylococcus aureus: Secondary | ICD-10-CM | POA: Diagnosis not present

## 2024-06-09 DIAGNOSIS — I63442 Cerebral infarction due to embolism of left cerebellar artery: Secondary | ICD-10-CM | POA: Diagnosis not present

## 2024-06-09 DIAGNOSIS — I63411 Cerebral infarction due to embolism of right middle cerebral artery: Secondary | ICD-10-CM | POA: Diagnosis not present

## 2024-06-09 DIAGNOSIS — B9562 Methicillin resistant Staphylococcus aureus infection as the cause of diseases classified elsewhere: Secondary | ICD-10-CM | POA: Diagnosis not present

## 2024-06-09 DIAGNOSIS — F1510 Other stimulant abuse, uncomplicated: Secondary | ICD-10-CM | POA: Diagnosis not present

## 2024-06-09 DIAGNOSIS — I69391 Dysphagia following cerebral infarction: Secondary | ICD-10-CM

## 2024-06-09 DIAGNOSIS — I330 Acute and subacute infective endocarditis: Secondary | ICD-10-CM | POA: Diagnosis not present

## 2024-06-09 DIAGNOSIS — F1110 Opioid abuse, uncomplicated: Secondary | ICD-10-CM | POA: Diagnosis not present

## 2024-06-09 DIAGNOSIS — G9340 Encephalopathy, unspecified: Secondary | ICD-10-CM | POA: Diagnosis not present

## 2024-06-09 LAB — HEPATITIS PANEL, ACUTE
HCV Ab: REACTIVE — AB
Hep A IgM: NONREACTIVE
Hep B C IgM: NONREACTIVE
Hepatitis B Surface Ag: NONREACTIVE

## 2024-06-09 LAB — HIV ANTIBODY (ROUTINE TESTING W REFLEX): HIV Screen 4th Generation wRfx: NONREACTIVE

## 2024-06-09 LAB — SYPHILIS: RPR W/REFLEX TO RPR TITER AND TREPONEMAL ANTIBODIES, TRADITIONAL SCREENING AND DIAGNOSIS ALGORITHM
RPR Ser Ql: REACTIVE — AB
RPR Titer: 1:128 {titer}

## 2024-06-09 NOTE — Progress Notes (Signed)
" ° °  Inpatient Rehabilitation Admissions Coordinator   I will place consult for full assessment for possible CIR.   Heron Leavell, RN, MSN Rehab Admissions Coordinator [phone] 06/09/2024 7:39 PM  "

## 2024-06-09 NOTE — Evaluation (Signed)
 Occupational Therapy Evaluation Patient Details Name: Patrick [name] MRN: [id number] DOB: [birth year] Today's Date: 06/09/2024   History of Present Illness   Pt is 44 year old presented to [facility] on  06/07/24 for decr responsiveness and lt sided weakness. Suspected drug overdose. CT showed acute/subacute rt MCA infarct. MRI showed the same as well as mild hemorrhagic transformation and acute infarct in lt cerebellar hemisphere. Pt also with severe sepsis and PNA. PMH - arthritis, substance abuse     Clinical Impressions Pt seen for OT evaluation this PM. Alert, although keeps eyes closed for majority of session. Followed 1-step commands with incr time. Due to cog/comm deficits, unable to obtain home/social information, but pt reported indep LOF PTA. Today, he presents with profound dysarthria, LUE paresis, suspected L visual inattention, and mild RUE proximal>distal weakness. Impulsive at times, but redirectable. He functionally required mod A for bed mobility and min A +2 for all transfers/mobility via HHA. Mod A for UB ADLs and max/total A for LB ADLs. He was oriented x4, but demo's impaired executive functioning skills and insight into stroke deficits. HR up to 120s with mobility, RN aware.  Pt is currently functioning below baseline and would benefit from ongoing acute OT services to progress towards safe discharge and to facilitate return to prior level of function. Current recommendation is high-intensity post-acute rehab (> 3 hours/day).     If plan is discharge home, recommend the following:   A lot of help with walking and/or transfers;A lot of help with bathing/dressing/bathroom;Assistance with cooking/housework;Assistance with feeding;Direct supervision/assist for medications management;Direct supervision/assist for financial management;Assist for transportation;Help with stairs or ramp for entrance;Supervision due to cognitive status     Functional Status Assessment   Patient has had  a recent decline in their functional status and demonstrates the ability to make significant improvements in function in a reasonable and predictable amount of time.     Equipment Recommendations   Other (comment) (defer to next level of care)     Recommendations for Other Services   Rehab consult     Precautions/Restrictions   Precautions Precautions: Fall Recall of Precautions/Restrictions: Impaired Restrictions Weight Bearing Restrictions Per Provider Order: No     Mobility Bed Mobility Overal bed mobility: Needs Assistance Bed Mobility: Supine to Sit, Sit to Supine     Supine to sit: Mod assist, HOB elevated Sit to supine: Contact guard assist, HOB elevated   General bed mobility comments: assist for trunk elevation and sequencing; pt initiating returning to supine well with incr time    Transfers Overall transfer level: Needs assistance Equipment used: 1 person hand held assist Transfers: Sit to/from Stand Sit to Stand: Min assist, +2 physical assistance           General transfer comment: Stood a few times unprompted by therapists from the bed. HHA and min A for short distance ambulation without knee buckling noted.      Balance Overall balance assessment: Needs assistance Sitting-balance support: Single extremity supported, Feet supported Sitting balance-Leahy Scale: Poor Sitting balance - Comments: reliant on RUE support and intermittent CGA/min A, sits in midline majority of the time   Standing balance support: Single extremity supported, During functional activity Standing balance-Leahy Scale: Poor Standing balance comment: min A, reliant on external support                           ADL either performed or assessed with clinical judgement   ADL Overall ADL's :  Needs assistance/impaired Eating/Feeding: NPO   Grooming: Moderate assistance;Wash/dry face;Standing Grooming Details (indicate cue type and reason): OT assisting with  grasp/release of washcloth in L hand, hand-over-hand to facilitate bringing washcloth to face, assist to stabilize pt while using R hand to wash face sinkside         Upper Body Dressing : Moderate assistance   Lower Body Dressing: Total assistance Lower Body Dressing Details (indicate cue type and reason): pt able to achieve figure four position, but with difficulty sequencing and initiating donning B socks, needs total A to complete     Toileting- Clothing Manipulation and Hygiene: Total assistance Toileting - Clothing Manipulation Details (indicate cue type and reason): foley inact; suspect incr A needed for other components of toileting 2/2 functional deficits     Functional mobility during ADLs: Minimal assistance;Cueing for safety;Cueing for sequencing       Vision Baseline Vision/History:  (no eye glasses present in room at time of OT eval) Patient Visual Report:  (UTA 2/2 cog/comm deficits) Vision Assessment?: Yes (brief) Eye Alignment: Within Functional Limits Ocular Range of Motion: Within Functional Limits Tracking/Visual Pursuits: Decreased smoothness of horizontal tracking (when gazing to the L) Additional Comments: suspect more perceptual deficits rather than visual deficits, although difficult to formally discern 2/2 cog/comm deficits and poor commnad following     Perception Perception: Impaired Preception Impairment Details: Inattention/Neglect Perception-Other Comments: suspected L inattention   Praxis         Pertinent Vitals/Pain Pain Assessment Pain Assessment: Faces Faces Pain Scale: Hurts a little bit Pain Location: generalized Pain Descriptors / Indicators: Grimacing, Discomfort Pain Intervention(s): Limited activity within patient's tolerance, Monitored during session, Repositioned, Other (comment) (RN alerted)     Extremity/Trunk Assessment Upper Extremity Assessment Upper Extremity Assessment: Right hand dominant;LUE deficits/detail;RUE  deficits/detail RUE Deficits / Details: proximal>distal weakness RUE Sensation:  (difficult to assess 2/2 cog/comm deficits) RUE Coordination: decreased fine motor;decreased gross motor LUE Deficits / Details: grip strength ~2/5, elbow flexion/extesnsion 3/5, shoulder flexion 2/5 LUE Sensation: decreased proprioception LUE Coordination: decreased fine motor;decreased gross motor   Lower Extremity Assessment Lower Extremity Assessment: Defer to PT evaluation   Cervical / Trunk Assessment Cervical / Trunk Assessment: Kyphotic (predominantly at neck)   Communication Communication Communication: Impaired Factors Affecting Communication: Reduced clarity of speech;Difficulty expressing self (rather dysarthric)   Cognition Arousal: Alert (but kept eyes closed for majority of session) Behavior During Therapy: Flat affect, Restless Cognition: Cognition impaired   Orientation impairments:  (oriented x4 (including day of week)) Awareness: Intellectual awareness impaired Memory impairment (select all impairments): Short-term memory, Working civil service fast streamer, Engineer, structural memory Attention impairment (select first level of impairment): Sustained attention Executive functioning impairment (select all impairments): Initiation, Organization, Sequencing, Reasoning, Problem solving OT - Cognition Comments: pt with poor problem solving, attention, distractible                 Following commands: Impaired Following commands impaired: Follows one step commands inconsistently, Follows one step commands with increased time     Cueing  General Comments   Cueing Techniques: Verbal cues;Tactile cues;Gestural cues  HR up to 127 during mobility   Exercises     Shoulder Instructions      Home Living Family/patient expects to be discharged to:: Unsure                                 Additional Comments: Pt lethargic and dysarthric and is a poor  historian      Prior  Functioning/Environment Prior Level of Function : Patient poor historian/Family not available             Mobility Comments: Assume independent ADLs Comments: pt reporting indep PTA    OT Problem List: Decreased strength;Decreased range of motion;Decreased activity tolerance;Impaired balance (sitting and/or standing);Impaired vision/perception;Decreased cognition;Decreased coordination;Decreased safety awareness;Impaired UE functional use   OT Treatment/Interventions: Self-care/ADL training;Therapeutic exercise;Neuromuscular education;DME and/or AE instruction;Therapeutic activities;Cognitive remediation/compensation;Visual/perceptual remediation/compensation;Patient/family education;Balance training      OT Goals(Current goals can be found in the care plan section)   Acute Rehab OT Goals Patient Stated Goal: to drink some water OT Goal Formulation: With patient Time For Goal Achievement: 06/23/24 Potential to Achieve Goals: Good   OT Frequency:  Min 2X/week    Co-evaluation PT/OT/SLP Co-Evaluation/Treatment: Yes Reason for Co-Treatment: Complexity of the patient's impairments (multi-system involvement);For patient/therapist safety   OT goals addressed during session: ADL's and self-care;Other (comment) (functional cognition)      AM-PAC OT 6 Clicks Daily Activity     Outcome Measure Help from another person eating meals?: Total Help from another person taking care of personal grooming?: A Lot Help from another person toileting, which includes using toliet, bedpan, or urinal?: A Lot Help from another person bathing (including washing, rinsing, drying)?: A Lot Help from another person to put on and taking off regular upper body clothing?: A Lot Help from another person to put on and taking off regular lower body clothing?: Total 6 Click Score: 10   End of Session Equipment Utilized During Treatment: Gait belt Nurse Communication: Mobility status;Other (comment) (pt  status (diet))  Activity Tolerance: Patient tolerated treatment well Patient left: in bed;with call bell/phone within reach;with bed alarm set  OT Visit Diagnosis: Unsteadiness on feet (R26.81);Other abnormalities of gait and mobility (R26.89);Muscle weakness (generalized) (M62.81);Feeding difficulties (R63.3)                Time: 8766-8743 OT Time Calculation (min): 23 min Charges:  OT General Charges $OT Visit: 1 Visit OT Evaluation $OT Eval Moderate Complexity: 1 Mod  Cristopher Ciccarelli M. [place], OTR/L [facility] Services [phone] Secure Chat Preferred  Rheanna Sergent 06/09/2024, 1:59 PM

## 2024-06-09 NOTE — Progress Notes (Signed)
 " PROGRESS NOTE  Patrick [name] FMW:[id number] DOB: 05-17-1981 DOA: 06/07/2024 PCP: Patient, No Pcp Per   LOS: 1 day   Brief Narrative / Interim history: 44 year old male with history of polysubstance abuse/IVDU who comes into the hospital with left facial droop, left upper extremity weakness and confusion.  He was found to have acute/subacute stroke on MRI in the setting of MRSA bacteremia and possible aortic valve vegetation on 2D echo.  Neurology, ID consulted  Subjective / 24h Interval events: He is quite lethargic this morning, but tries to wake up when prompted, he is verbalizing his name but appears to be dysarthric.  He follows commands  Assesement and Plan: Principal problem Sepsis due to MRSA bacteremia -patient febrile to one 1.4 on arrival, tachycardic, tachypneic, found to have MRSA bacteremia.  There is high suspicion for infective endocarditis due to CVA caused by presumed septic emboli.  CT scan of the chest also showed multifocal pneumonia - Currently he is on vancomycin.  ID consulted and following.  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  Imaging on admission with MRI showed acute/subacute infarction in the right frontoparietal junction/insular region on the right MCA territory with mild hemorrhagic transformation.  It also showed acute infarct in the left cerebellar hemisphere and small acute lacunar infarcts in the left temporal lobe.  CT angiogram without LVO.  Underwent a 2D echocardiogram which was unable to rule out oscillating density on the aortic valve.  TEE is pending - A1c 6.0, LDL 5 - Continues to have left hemiparesis and dysarthria.  PT recommends CIR but not ready yet  Prediabetes-A1c 6.0.  Monitor  IVDU -there are no significant withdrawal symptoms currently, he is drowsy currently  Elevated blood pressure -not taking any medications, no history of HTN.  May need initiation  if remains persistent  Dysphagia -SLP consulted, evaluated this morning, remains n.p.o.  Scheduled Meds:  sodium chloride  flush  3 mL Intravenous Once   Continuous Infusions:  vancomycin 1,250 mg (06/09/24 1031)   PRN Meds:.acetaminophen  **OR** acetaminophen , buprenorphine-naloxone, naLOXone (NARCAN)  injection  Current Outpatient Medications  Medication Instructions   acetaminophen  (TYLENOL ) 1,000 mg, Oral, Every 6 hours PRN   albuterol (PROVENTIL HFA;VENTOLIN HFA) 108 (90 Base) MCG/ACT inhaler 2 puffs, Inhalation, Every 6 hours PRN   amoxicillin-clavulanate (AUGMENTIN) 875-125 MG tablet 1 tablet, 2 times daily    Diet Orders (From admission, onward)     Start     Ordered   06/08/24 1616  Diet NPO time specified Except for: Ice Chips  Diet effective now       Comments: Meds crushed in applesauce  Question:  Except for  Answer:  Ice Chips   06/08/24 1615            DVT prophylaxis: SCDs Start: 06/08/24 0540   Lab Results  Component Value Date   PLT 108 (L) 06/07/2024      Code Status: Full Code  Family Communication: No family at bedside  Status is: Inpatient Remains inpatient appropriate because: Severity of illness   Level of care: Progressive  Consultants:  Neurology ID  Objective: Vitals:   06/09/24 0055 06/09/24 0436 06/09/24 0444 06/09/24 0833  BP: (!) 149/102 (!) 144/101  (!) 149/91  Pulse: (!) 103 (!) 101  92  Resp: 16 20  18   Temp: 98.3 F (36.8 C) 98.3 F (36.8 C)  98.4 F (36.9 C)  TempSrc: Oral Oral  Oral  SpO2: 99% 99%  98%  Weight:   74.3 kg   Height:        Intake/Output Summary (Last 24 hours) at 06/09/2024 1135 Last data filed at 06/09/2024 0600 Gross per 24 hour  Intake 1812.19 ml  Output 1950 ml  Net -137.81 ml   Wt Readings from Last 3 Encounters:  06/09/24 74.3 kg    Examination:  Constitutional: NAD, drowsy Eyes: no scleral icterus ENMT: Mucous membranes are moist.  Neck: normal, supple Respiratory: clear to  auscultation bilaterally, no wheezing, no crackles.  Cardiovascular: Regular rate and rhythm, no murmurs / rubs / gallops. No LE edema.  Abdomen: non distended, no tenderness. Bowel sounds positive.  Musculoskeletal: no clubbing / cyanosis.    Data Reviewed: I have independently reviewed following labs and imaging studies   CBC Recent Labs  Lab 06/07/24 2331 06/07/24 2334 06/07/24 2335  WBC 20.1*  --   --   HGB 15.1 15.6 16.3  HCT 43.4 46.0 48.0  PLT 108*  --   --   MCV 84.4  --   --   MCH 29.4  --   --   MCHC 34.8  --   --   RDW 13.7  --   --   LYMPHSABS 1.0  --   --   MONOABS 1.7*  --   --   EOSABS 0.0  --   --   BASOSABS 0.1  --   --     Recent Labs  Lab 06/07/24 2331 06/07/24 2334 06/07/24 2335 06/08/24 0123 06/08/24 0337 06/08/24 0513 06/08/24 0828 06/08/24 0903  NA 128* 129* 130*  --   --   --   --   --   K 4.5 4.1 4.1  --   --   --   --   --   CL 91*  --  94*  --   --   --   --   --   CO2 25  --   --   --   --   --   --   --   GLUCOSE 143*  --  139*  --   --   --   --   --   BUN 36*  --  37*  --   --   --   --   --   CREATININE 1.00  --  1.10  --   --   --   --   --   CALCIUM 9.0  --   --   --   --   --   --   --   AST 144*  --   --   --   --   --   --   --   ALT 71*  --   --   --   --   --   --   --   ALKPHOS 181*  --   --   --   --   --   --   --   BILITOT 0.9  --   --   --   --   --   --   --   ALBUMIN 3.3*  --   --   --   --   --   --   --   PROCALCITON  --   --   --   --   --  3.05  --   --   LATICACIDVEN  --   --   --  2.5* 2.6*  --  1.5  --   INR 1.3*  --   --   --   --   --   --   --   HGBA1C  --   --   --   --   --   --   --  6.0*  AMMONIA 28  --   --   --   --   --   --   --     ------------------------------------------------------------------------------------------------------------------ Recent Labs    06/08/24 0903  CHOL 74  HDL <10*  LDLCALC NOT CALCULATED  TRIG 209*  CHOLHDL NOT CALCULATED  LDLDIRECT 5    Lab Results   Component Value Date   HGBA1C 6.0 (H) 06/08/2024   ------------------------------------------------------------------------------------------------------------------ No results for input(s): TSH, T4TOTAL, T3FREE, THYROIDAB in the last 72 hours.  Invalid input(s): FREET3  Cardiac Enzymes No results for input(s): CKMB, TROPONINI, MYOGLOBIN in the last 168 hours.  Invalid input(s): CK ------------------------------------------------------------------------------------------------------------------ No results found for: BNP  CBG: Recent Labs  Lab 06/08/24 0006 06/08/24 2201  GLUCAP 161* 123*    Recent Results (from the past 240 hours)  Blood culture (routine x 2)     Status: Abnormal (Preliminary result)   Collection Time: 06/08/24 12:35 AM   Specimen: BLOOD RIGHT ARM  Result Value Ref Range Status   Specimen Description BLOOD RIGHT ARM  Final   Special Requests   Final    BOTTLES DRAWN AEROBIC AND ANAEROBIC Blood Culture adequate volume   Culture  Setup Time   Final    GRAM POSITIVE COCCI IN CLUSTERS IN BOTH AEROBIC AND ANAEROBIC BOTTLES CRITICAL RESULT CALLED TO, READ BACK BY AND VERIFIED WITH: PHARMD J.FRENS AT 1213 ON 06/08/2024 BY T.SAAD.    Culture (A)  Final    STAPHYLOCOCCUS AUREUS SUSCEPTIBILITIES TO FOLLOW Performed at [facility], [address], [place], [place] [address]    Report Status PENDING  Incomplete  Blood Culture ID Panel (Reflexed)     Status: Abnormal   Collection Time: 06/08/24 12:35 AM  Result Value Ref Range Status   Enterococcus faecalis NOT DETECTED NOT DETECTED Final   Enterococcus Faecium NOT DETECTED NOT DETECTED Final   Listeria monocytogenes NOT DETECTED NOT DETECTED Final   Staphylococcus species DETECTED (A) NOT DETECTED Final    Comment: CRITICAL RESULT CALLED TO, READ BACK BY AND VERIFIED WITH: PHARMD J.FRENS AT 1213 ON 06/08/2024 BY T.SAAD.    Staphylococcus aureus (BCID) DETECTED (A) NOT DETECTED  Final    Comment: Methicillin (oxacillin)-resistant Staphylococcus aureus (MRSA). MRSA is predictably resistant to beta-lactam antibiotics (except ceftaroline). Preferred therapy is vancomycin unless clinically contraindicated. Patient requires contact precautions if  hospitalized. CRITICAL RESULT CALLED TO, READ BACK BY AND VERIFIED WITH: PHARMD J.FRENS AT 1213 ON 06/08/2024 BY T.SAAD.    Staphylococcus epidermidis NOT DETECTED NOT DETECTED Final   Staphylococcus lugdunensis NOT DETECTED NOT DETECTED Final   Streptococcus species NOT DETECTED NOT DETECTED Final   Streptococcus agalactiae NOT DETECTED NOT DETECTED Final   Streptococcus pneumoniae NOT DETECTED NOT DETECTED Final   Streptococcus pyogenes NOT DETECTED NOT DETECTED Final   A.calcoaceticus-baumannii NOT DETECTED NOT DETECTED Final   Bacteroides fragilis NOT DETECTED NOT DETECTED Final   Enterobacterales NOT DETECTED NOT DETECTED Final   Enterobacter cloacae complex NOT DETECTED NOT DETECTED Final   Escherichia coli NOT DETECTED NOT DETECTED Final   Klebsiella aerogenes NOT DETECTED NOT DETECTED Final   Klebsiella oxytoca NOT DETECTED NOT DETECTED Final   Klebsiella  pneumoniae NOT DETECTED NOT DETECTED Final   Proteus species NOT DETECTED NOT DETECTED Final   Salmonella species NOT DETECTED NOT DETECTED Final   Serratia marcescens NOT DETECTED NOT DETECTED Final   Haemophilus influenzae NOT DETECTED NOT DETECTED Final   Neisseria meningitidis NOT DETECTED NOT DETECTED Final   Pseudomonas aeruginosa NOT DETECTED NOT DETECTED Final   Stenotrophomonas maltophilia NOT DETECTED NOT DETECTED Final   Candida albicans NOT DETECTED NOT DETECTED Final   Candida auris NOT DETECTED NOT DETECTED Final   Candida glabrata NOT DETECTED NOT DETECTED Final   Candida krusei NOT DETECTED NOT DETECTED Final   Candida parapsilosis NOT DETECTED NOT DETECTED Final   Candida tropicalis NOT DETECTED NOT DETECTED Final   Cryptococcus  neoformans/gattii NOT DETECTED NOT DETECTED Final   Meth resistant mecA/C and MREJ DETECTED (A) NOT DETECTED Final    Comment: CRITICAL RESULT CALLED TO, READ BACK BY AND VERIFIED WITH: PHARMD J.FRENS AT 1213 ON 06/08/2024 BY T.SAAD. Performed at [facility], [address] [place], [place] [address]   Blood culture (routine x 2)     Status: Abnormal (Preliminary result)   Collection Time: 06/08/24 12:40 AM   Specimen: BLOOD LEFT ARM  Result Value Ref Range Status   Specimen Description BLOOD LEFT ARM  Final   Special Requests   Final    BOTTLES DRAWN AEROBIC AND ANAEROBIC Blood Culture adequate volume   Culture  Setup Time   Final    GRAM POSITIVE COCCI IN CLUSTERS IN BOTH AEROBIC AND ANAEROBIC BOTTLES CRITICAL VALUE NOTED.  VALUE IS CONSISTENT WITH PREVIOUSLY REPORTED AND CALLED VALUE. Performed at [facility], [address], [place], [place] [address]    Culture STAPHYLOCOCCUS AUREUS (A)  Final   Report Status PENDING  Incomplete  Resp panel by RT-PCR (RSV, Flu A&B, Covid) Anterior Nasal Swab     Status: None   Collection Time: 06/08/24  3:54 AM   Specimen: Anterior Nasal Swab  Result Value Ref Range Status   SARS Coronavirus 2 by RT PCR NEGATIVE NEGATIVE Final   Influenza A by PCR NEGATIVE NEGATIVE Final   Influenza B by PCR NEGATIVE NEGATIVE Final    Comment: (NOTE) The Xpert Xpress SARS-CoV-2/FLU/RSV plus assay is intended as an aid in the diagnosis of influenza from Nasopharyngeal swab specimens and should not be used as a sole basis for treatment. Nasal washings and aspirates are unacceptable for Xpert Xpress SARS-CoV-2/FLU/RSV testing.  Fact Sheet for Patients: bloggercourse.com  Fact Sheet for Healthcare Providers: seriousbroker.it  This test is not yet approved or cleared by the [place]  FDA and has been authorized for detection and/or diagnosis of SARS-CoV-2 by FDA under an Emergency Use  Authorization (EUA). This EUA will remain in effect (meaning this test can be used) for the duration of the COVID-19 declaration under Section 564(b)(1) of the Act, 21 U.S.C. section 360bbb-3(b)(1), unless the authorization is terminated or revoked.     Resp Syncytial Virus by PCR NEGATIVE NEGATIVE Final    Comment: (NOTE) Fact Sheet for Patients: bloggercourse.com  Fact Sheet for Healthcare Providers: seriousbroker.it  This test is not yet approved or cleared by the [place]  FDA and has been authorized for detection and/or diagnosis of SARS-CoV-2 by FDA under an Emergency Use Authorization (EUA). This EUA will remain in effect (meaning this test can be used) for the duration of the COVID-19 declaration under Section 564(b)(1) of the Act, 21 U.S.C. section 360bbb-3(b)(1), unless the authorization is terminated or revoked.  Performed at [facility], [address], [place], [place] [address]      Radiology Studies: MR BRAIN WO CONTRAST Result Date: 06/08/2024 EXAM: MRI BRAIN WITHOUT CONTRAST 06/08/2024 12:13:07 PM TECHNIQUE: Multiplanar multisequence MRI of the head/brain was performed without the administration of intravenous contrast. COMPARISON: CT of the head dated 06/07/2024. CLINICAL HISTORY: Neuro deficit, acute, stroke suspected. FINDINGS: BRAIN AND VENTRICLES: Restricted diffusion involving the right frontoparietal junction and insular region, corresponding with the area of diminished attenuation on prior head CT, compatible with acute/subacute nonhemorrhagic infarction. There is also a linear area of restricted diffusion present posteromedially within the left cerebellar hemisphere, also compatible with acute nonhemorrhagic infarct. There are additional foci of restricted diffusion within the left temporal lobe, compatible with small acute lacunar infarcts. An additional lesion is present anteromedially within the  right frontal lobe subcortical white matter. There is hemosiderin staining involving the right MCA distribution infarct, likely representing petechial oozing. There is also blooming artifact present posteromedially within the right occipital lobe. No mass. No midline shift. No hydrocephalus. The sella is unremarkable. Normal flow voids. ORBITS: No acute abnormality. SINUSES AND MASTOIDS: Mucosal disease present within the ethmoid and maxillary sinuses. BONES AND SOFT TISSUES: Normal marrow signal. No acute soft tissue abnormality. IMPRESSION: 1. Acute/subacute infarction at the right frontoparietal junction/insular region (right MCA territory) with associated hemosiderin staining, most compatible with mild hemorrhagic transformation/oozing. 2. Acute infarct in the left cerebellar hemisphere. 3. Small acute lacunar infarcts in the left temporal lobe. 4. Additional focus in the anteromedial right frontal subcortical white matter, compatible with an additional acute infarct. 5. Blooming artifact in the posteromedial right occipital lobe, most consistent with a small chronic hemorrhagic focus (e.g., microhemorrhage). 6. Mucosal disease in the ethmoid and maxillary sinuses. Electronically signed by: Evalene Coho MD 06/08/2024 12:38 PM EST RP Workstation: [id number]     Nilda Fendt, MD, PhD [facility]  Between 7 am - 7 pm I am available, please contact me via Amion (for emergencies) or Securechat (non urgent messages)  Between 7 pm - 7 am I am not available, please contact night coverage MD/APP via Amion  "

## 2024-06-09 NOTE — Progress Notes (Signed)
 STROKE TEAM PROGRESS NOTE    SIGNIFICANT HOSPITAL EVENTS  44 y.o. male BIB EMS as a code stroke for L facial droop, L arm weakness. Patient poorly responsive enroute and being bagged but woke up with Narcan 1mg .  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 He was outside tnkase window and not offered thrombectomy due to infarcted tissue appearance on CT being suggestive of irreversibly damaged tissue. UDS positive for amphetamines and fentanyl .  INTERIM HISTORY/SUBJECTIVE Patient sitting up in bed.  He is drowsy but arousable follows commands.  Continues to have left hemiparesis and severe dysarthria.Follows simple commands.  Left arm weakness, left facial droop continues.Patient states these symptoms started this weekend, reflective of subacute stroke seen on imaging.  TEE pending likely tomorrow  OBJECTIVE  CBC    Component Value Date/Time   WBC 20.1 (H) 06/07/2024 2331   RBC 5.14 06/07/2024 2331   HGB 16.3 06/07/2024 2335   HCT 48.0 06/07/2024 2335   PLT 108 (L) 06/07/2024 2331   MCV 84.4 06/07/2024 2331   MCH 29.4 06/07/2024 2331   MCHC 34.8 06/07/2024 2331   RDW 13.7 06/07/2024 2331   LYMPHSABS 1.0 06/07/2024 2331   MONOABS 1.7 (H) 06/07/2024 2331   EOSABS 0.0 06/07/2024 2331   BASOSABS 0.1 06/07/2024 2331    BMET    Component Value Date/Time   NA 130 (L) 06/07/2024 2335   K 4.1 06/07/2024 2335   CL 94 (L) 06/07/2024 2335   CO2 25 06/07/2024 2331   GLUCOSE 139 (H) 06/07/2024 2335   BUN 37 (H) 06/07/2024 2335   CREATININE 1.10 06/07/2024 2335   CALCIUM 9.0 06/07/2024 2331   GFRNONAA >60 06/07/2024 2331    IMAGING past 24 hours ECHOCARDIOGRAM COMPLETE Result Date: 06/08/2024    ECHOCARDIOGRAM REPORT   Patient Name:   SEITH AIKEY Date of Exam: 06/08/2024 Medical Rec #:  [id number]     Height:       71.0 in Accession #:    [id number]    Weight:       174.6 lb Date of Birth:  [birth year]     BSA:          1.990 m Patient Age:    43 years      BP:            117/81 mmHg Patient Gender: M             HR:           105 bpm. Exam Location:  Inpatient Procedure: 2D Echo, Cardiac Doppler, Color Doppler and Saline Contrast Bubble            Study (Both Spectral and Color Flow Doppler were utilized during            procedure). Indications:    Stroke I63.9  History:        Patient has no prior history of Echocardiogram examinations.  Sonographer:    Jayson Gaskins Referring Phys: [id number] ROCKY JAYSON LIKES IMPRESSIONS  1. Cannot R/O oscillating density on aortic valve; suggest TEE to further assess.  2. Left ventricular ejection fraction, by estimation, is 60 to 65%. The left ventricle has normal function. The left ventricle has no regional wall motion abnormalities. Left ventricular diastolic parameters were normal.  3. Right ventricular systolic function is normal. The right ventricular size is normal.  4. The mitral valve is normal in structure. Trivial mitral valve regurgitation. No evidence of mitral stenosis.  5. The aortic valve is  tricuspid. Aortic valve regurgitation is not visualized. No aortic stenosis is present.  6. The inferior vena cava is normal in size with greater than 50% respiratory variability, suggesting right atrial pressure of 3 mmHg.  7. Agitated saline contrast bubble study was negative, with no evidence of any interatrial shunt. Comparison(s): No prior Echocardiogram. FINDINGS  Left Ventricle: Left ventricular ejection fraction, by estimation, is 60 to 65%. The left ventricle has normal function. The left ventricle has no regional wall motion abnormalities. The left ventricular internal cavity size was normal in size. There is  no left ventricular hypertrophy. Left ventricular diastolic parameters were normal. Right Ventricle: The right ventricular size is normal. Right ventricular systolic function is normal. Left Atrium: Left atrial size was normal in size. Right Atrium: Right atrial size was normal in size. Pericardium: There is no evidence of  pericardial effusion. Mitral Valve: The mitral valve is normal in structure. Mild mitral annular calcification. Trivial mitral valve regurgitation. No evidence of mitral valve stenosis. Tricuspid Valve: The tricuspid valve is normal in structure. Tricuspid valve regurgitation is not demonstrated. No evidence of tricuspid stenosis. Aortic Valve: The aortic valve is tricuspid. Aortic valve regurgitation is not visualized. No aortic stenosis is present. Aortic valve mean gradient measures 4.0 mmHg. Aortic valve peak gradient measures 6.2 mmHg. Aortic valve area, by VTI measures 3.98 cm. Pulmonic Valve: The pulmonic valve was not well visualized. Pulmonic valve regurgitation is not visualized. No evidence of pulmonic stenosis. Aorta: The aortic root is normal in size and structure. Venous: The inferior vena cava is normal in size with greater than 50% respiratory variability, suggesting right atrial pressure of 3 mmHg. IAS/Shunts: No atrial level shunt detected by color flow Doppler. Agitated saline contrast was given intravenously to evaluate for intracardiac shunting. Agitated saline contrast bubble study was negative, with no evidence of any interatrial shunt. Additional Comments: Cannot R/O oscillating density on aortic valve; suggest TEE to further assess.  LEFT VENTRICLE PLAX 2D LVIDd:         5.00 cm   Diastology LVIDs:         3.50 cm   LV e' lateral: 17.20 cm/s LV PW:         0.90 cm LV IVS:        0.80 cm LVOT diam:     2.07 cm LV SV:         72 LV SV Index:   36 LVOT Area:     3.37 cm  RIGHT VENTRICLE RV S prime:     13.90 cm/s TAPSE (M-mode): 2.5 cm LEFT ATRIUM             Index        RIGHT ATRIUM           Index LA Vol (A2C):   53.3 ml 26.78 ml/m  RA Area:     15.40 cm LA Vol (A4C):   26.6 ml 13.36 ml/m  RA Volume:   34.90 ml  17.53 ml/m LA Biplane Vol: 37.8 ml 18.99 ml/m  AORTIC VALVE AV Area (Vmax):    3.23 cm AV Area (Vmean):   3.10 cm AV Area (VTI):     3.98 cm AV Vmax:           125.00 cm/s  AV Vmean:          90.600 cm/s AV VTI:            0.180 m AV Peak Grad:      6.2 mmHg  AV Mean Grad:      4.0 mmHg LVOT Vmax:         120.00 cm/s LVOT Vmean:        83.400 cm/s LVOT VTI:          0.213 m LVOT/AV VTI ratio: 1.18  SHUNTS Systemic VTI:  0.21 m Systemic Diam: 2.07 cm Redell Shallow MD Electronically signed by Redell Shallow MD Signature Date/Time: 06/08/2024/12:45:38 PM    Final    MR BRAIN WO CONTRAST Result Date: 06/08/2024 EXAM: MRI BRAIN WITHOUT CONTRAST 06/08/2024 12:13:07 PM TECHNIQUE: Multiplanar multisequence MRI of the head/brain was performed without the administration of intravenous contrast. COMPARISON: CT of the head dated 06/07/2024. CLINICAL HISTORY: Neuro deficit, acute, stroke suspected. FINDINGS: BRAIN AND VENTRICLES: Restricted diffusion involving the right frontoparietal junction and insular region, corresponding with the area of diminished attenuation on prior head CT, compatible with acute/subacute nonhemorrhagic infarction. There is also a linear area of restricted diffusion present posteromedially within the left cerebellar hemisphere, also compatible with acute nonhemorrhagic infarct. There are additional foci of restricted diffusion within the left temporal lobe, compatible with small acute lacunar infarcts. An additional lesion is present anteromedially within the right frontal lobe subcortical white matter. There is hemosiderin staining involving the right MCA distribution infarct, likely representing petechial oozing. There is also blooming artifact present posteromedially within the right occipital lobe. No mass. No midline shift. No hydrocephalus. The sella is unremarkable. Normal flow voids. ORBITS: No acute abnormality. SINUSES AND MASTOIDS: Mucosal disease present within the ethmoid and maxillary sinuses. BONES AND SOFT TISSUES: Normal marrow signal. No acute soft tissue abnormality. IMPRESSION: 1. Acute/subacute infarction at the right frontoparietal  junction/insular region (right MCA territory) with associated hemosiderin staining, most compatible with mild hemorrhagic transformation/oozing. 2. Acute infarct in the left cerebellar hemisphere. 3. Small acute lacunar infarcts in the left temporal lobe. 4. Additional focus in the anteromedial right frontal subcortical white matter, compatible with an additional acute infarct. 5. Blooming artifact in the posteromedial right occipital lobe, most consistent with a small chronic hemorrhagic focus (e.g., microhemorrhage). 6. Mucosal disease in the ethmoid and maxillary sinuses. Electronically signed by: Evalene Coho MD 06/08/2024 12:38 PM EST RP Workstation: [id number]    Vitals:   06/09/24 0055 06/09/24 0436 06/09/24 0444 06/09/24 0833  BP: (!) 149/102 (!) 144/101  (!) 149/91  Pulse: (!) 103 (!) 101  92  Resp: 16 20  18   Temp: 98.3 F (36.8 C) 98.3 F (36.8 C)  98.4 F (36.9 C)  TempSrc: Oral Oral  Oral  SpO2: 99% 99%  98%  Weight:   74.3 kg   Height:         PHYSICAL EXAM General:  Lethargic, acutely ill patient CV: Regular rate and rhythm on monitor Respiratory:  Regular, unlabored respirations on room air  NEURO:  Mental Status: Lethargic, but awakens to voice. Fairly interactive. Able to follow simple commands.  Speech/Language: unable to speak more than a couple of words in severely dysarthric voice. Is able to able simple questions.   Cranial Nerves:  II: PERRL. Difficult to perform full VF testing. Blinks to threat bilaterally.  III, IV, VI: EOMI. Eyelids elevate symmetrically.  V: Sensation is intact to light touch and symmetrical to face.  VII: Right facial droop VIII: hearing intact to voice. IX, X: Palate elevates symmetrically. Severe dysarthria.  KP:Dynloizm shrug 5/5. XII: tongue is midline without fasciculations. Motor:  LUE: moderate drift, 3/5 Generalized weakness.  RUE: slight drift, 4-/5 BLE: slight drift, 4-/5  Tone: is normal and bulk is  normal Sensation- Intact to light touch bilaterally. Extinction absent to light touch to DSS.   Coordination: FTN slow but intact bilaterally Gait- deferred  Most Recent NIH 10     ASSESSMENT/PLAN  [name] is a  44 y.o. male who is brought in as a code stroke for L facial droop, L arm weakness. There is concern for substance use. Patient poorly responsive enroute and being bagged but woke up with Narcan 1mg . Neuro exam with drowsiness, L facial droop and L arm weakness and L leg weakness. 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 He was outside tnkase window and not offered thrombectomy due to infarcted tissue appearance on CT being suggestive of irreversibly damaged tissue. NIH on Admission 7.  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 ASPECTS score is 7. CTA head & neck  No large vessel occlusion, hemodynamically significant stenosis, or aneurysm in the head or neck. Atherosclerosis at the carotid bifurcations without hemodynamically significant stenosis of the internal carotid arteries. MRI   Acute/subacute infarction at the right frontoparietal junction/insular region (right MCA territory) with associated hemosiderin staining, most compatible with mild hemorrhagic transformation/oozing. Acute infarct in the left cerebellar hemisphere. Small acute lacunar infarcts in the left temporal lobe. Additional focus in the anteromedial right frontal subcortical white matter,compatible with an additional acute infarct. Blooming artifact in the posteromedial right occipital lobe, most consistent with a small chronic hemorrhagic focus (e.g., microhemorrhage). 2D Echo: Unable to rule out oscillating density  on aortic valve, TEE suggested to further assess.  EF 60 to 65% TEE recommended, to evaluate for endocarditis given above findings, stroke pattern and patient's history of daily IVDA.  Discussed with attending LDL 5 mg percent HgbA1c 6.0 No antithrombotic prior to admission, now on No antithrombotic while evaluating for endocarditis Therapy recommendations:  Pending Disposition:  pending  Likely Septic 2/2 Bacteremia Pneumonia ? Left foot cellulitis Blood Cultures positive for MRSA Vanco continued Elevated Procal Elevated LA 2.5--2.6--1.5 Negative Resp Panel UA Negative Concern for vegetation on TTE, TEE recommended  Hypertension, no known history Home meds:  none Stable, on low end Blood Pressure Goal: BP less than 220/110  Permissive hypertension for first 24 hours after symptom onset then gradually normalize.  Hyperlipidemia Home meds:  none LDL pending, goal < 70  Pre-Diabetic Home meds:  none HgbA1c 6.0, goal < 7.0 Recommend close follow-up with PCP   Tobacco Abuse Patient is a current smoker  Substance Abuse Patient uses meth daily, IV UDS positive for amphetamines and fentanyl  Revisit cessation discussion with improved mental status TOC consult for cessation placed  Dysphagia Patient has post-stroke dysphagia, SLP consulted    Diet   Diet NPO time specified Except for: Ice Chips   Advance diet as tolerated  Hospital day # 1    Patient presented with left hemiparesis as well as left foot cellulitis and pneumonia and neurological exam shows dysarthria and left hemiparesis with MRI showing 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  Recommend antibiotics as per ID.  Aspirin alone for stroke prevention.  Aggressive risk factor modification.  Physical occupational speech therapy consults.  TEE to confirm aortic valve vegetation.  No family at the bedside  Eather Popp, MD Medical Director [facility]  Stroke Center Pager: [phone] 06/09/2024 10:23 AM  To contact Stroke Continuity provider, please refer to [email or web address]. After hours, contact General Neurology

## 2024-06-09 NOTE — TOC CM/SW Note (Signed)
 Transition of Care [facility]) - Inpatient Brief Assessment   Patient Details  Name: Patrick [name] MRN: [id number] Date of Birth: 04-Jul-1980  Transition of Care [facility]) CM/SW Contact:    Almarie CHRISTELLA Goodie, LCSW Phone Number: 06/09/2024, 12:34 PM   Clinical Narrative:    Patient lethargic and confused at this time, unable to participate in assessment. Recommendations currently for CIR but unsure if he can tolerate. CSW to continue to follow.    Transition of Care Asessment: Insurance and Status: Insurance coverage has been reviewed Patient has primary care physician: No Home environment has been reviewed: Home Prior level of function:: Unsure, patient unable to participate Prior/Current Home Services: No current home services Social Drivers of Health Review: SDOH reviewed no interventions necessary Readmission risk has been reviewed: Yes Transition of care needs: transition of care needs identified, TOC will continue to follow

## 2024-06-09 NOTE — Progress Notes (Signed)
 Speech Language Pathology Treatment: Dysphagia;Cognitive-Linguistic  Patient Details Name: Patrick [name] MRN: [id number] DOB: 1980/09/02 Today's Date: 06/09/2024 Time: 9148-9089 SLP Time Calculation (min) (ACUTE ONLY): 19 min  Assessment / Plan / Recommendation Clinical Impression  Continue NPO but may have meds crushed, ice chips and small sips water after oral care. Needs instrumental study when appropriate.   Mild improvements in swallow function however he will need instrumental assessment prior to initiating po's. He was awake/mildly drowsy, kept eyes closed and eager for food/liquids. Oral transit with applesauce was prompt today with intermittent second swallow. Decreased s/s aspiration with 2-3 oz water consumed throughout with one immediate cough at end of session. Instrumental evaluation when if he continues to exhibit adequate alertness. Will follow up.  Brief introduction of speech strategies with examples provided. He was able to demonstrate x 1 and will continue intervention.    HPI HPI: 44 yo male presenting to ED 12/30 with L facial droop and L arm weakness. UDS positive for amphetamines and fentanyl . MRI shows acute/subacute infraction in the R MCA territory with possible small hemorrhagic transformation in addition to acute infarcts in the L cerebellum and L temporal lobe. Not a candidate for TNK or thrombectomy. Concern for endocarditis. CT C/A/P shows RLL posterior basal consolidation and additional R upper and middle lobe peripheral consolidations, consistent with multifocal pneumonia. PMH includes polysubstance abuse, daily IV meth use      SLP Plan  Continue with current plan of care        Swallow Evaluation Recommendations   Recommendations: NPO except meds;Ice chips PRN after oral care (ice chips and small sips water) Liquid Administration via: Straw Medication Administration: Crushed with puree     Recommendations                     Oral care  QID;Oral care prior to ice chip/H20   Frequent or constant Supervision/Assistance Dysarthria and anarthria (R47.1);Dysphagia, unspecified (R13.10)     Continue with current plan of care     Dustin Olam Bull  06/09/2024, 9:16 AM

## 2024-06-09 NOTE — Progress Notes (Signed)
 Physical Therapy Treatment Patient Details Name: Patrick [name] MRN: [id number] DOB: [birth year] Today's Date: 06/09/2024   History of Present Illness Pt is 44 year old presented [facility]  [facility]07[facility] for decr responsiveness and lt sided weakness. Suspected drug overdose. CT showed acute/subacute rt MCA infarct. MRI showed the same as well as mild hemorrhagic transformation and acute infarct in lt cerebellar hemisphere. Pt also with severe sepsis and PNA. PMH - arthritis, substance abuse    PT Comments  Pt with fair tolerance to treatment today. Co-treat with OT. Pt able to ambulate to sink with +2 Min A via HHA and perform ADLs with OT. Pt noted with very short almost shuffling gait. HR up to 127 with ambulation. No change in DC/DME recs at this time. PT will continue to follow.      If plan is discharge home, recommend the following: Two people to help with walking and/or transfers;A lot of help with bathing/dressing/bathroom;Direct supervision/assist for medications management;Direct supervision/assist for financial management;Assist for transportation   Can travel by private vehicle        Equipment Recommendations  Other (comment)    Recommendations for Other Services       Precautions / Restrictions Precautions Precautions: Fall Recall of Precautions/Restrictions: Impaired Restrictions Weight Bearing Restrictions Per Provider Order: No     Mobility  Bed Mobility Overal bed mobility: Needs Assistance Bed Mobility: Supine to Sit, Sit to Supine     Supine to sit: Mod assist, HOB elevated Sit to supine: Contact guard assist, HOB elevated   General bed mobility comments: assist for trunk elevation and sequencing; pt initiating returning to supine well with incr time    Transfers Overall transfer level: Needs assistance Equipment used: 1 person hand held assist Transfers: Sit to/from Stand Sit to Stand: Min assist, +2 physical assistance           General transfer  comment: Stood a few times unprompted by therapists from the bed. HHA and min A for short distance ambulation without knee buckling noted.    Ambulation/Gait Ambulation/Gait assistance: +2 physical assistance, +2 safety/equipment, Min assist Gait Distance (Feet): 10 Feet Assistive device: 2 person hand held assist Gait Pattern/deviations: Trunk flexed, Narrow base of support, Shuffle, Decreased stride length, Decreased step length - right, Leaning posteriorly Gait velocity: decreased     General Gait Details: Pt able to ambulate to sink with +2 Min A via HHA. Pt noted with very short almost shuffling gait.  HR up to 127 with ambulation.   Stairs             Wheelchair Mobility     Tilt Bed    Modified Rankin (Stroke Patients Only)       Balance Overall balance assessment: Needs assistance Sitting-balance support: Single extremity supported, Feet supported Sitting balance-Leahy Scale: Poor Sitting balance - Comments: reliant on RUE support and intermittent CGA/min A, sits in midline majority of the time   Standing balance support: Single extremity supported, During functional activity Standing balance-Leahy Scale: Poor Standing balance comment: min A, reliant on external support                            Communication Communication Communication: Impaired Factors Affecting Communication: Reduced clarity of speech;Difficulty expressing self (rather dysarthric)  Cognition Arousal: Alert (but kept eyes closed for majority of session) Behavior During Therapy: Flat affect, Restless  PT - Cognition Comments: Pt with eyes closed throughout. Will answer questions but extremely difficult to understand due to dysarthria. Following commands: Impaired Following commands impaired: Follows one step commands inconsistently, Follows one step commands with increased time    Cueing Cueing Techniques: Verbal cues, Tactile cues,  Gestural cues  Exercises      General Comments General comments (skin integrity, edema, etc.): HR up to 127      Pertinent Vitals/Pain Pain Assessment Pain Assessment: Faces Faces Pain Scale: Hurts a little bit Pain Location: generalized Pain Descriptors / Indicators: Grimacing, Discomfort Pain Intervention(s): Limited activity within patient's tolerance, Monitored during session, Repositioned    Home Living Family/patient expects to be discharged to:: Unsure                   Additional Comments: Pt lethargic and dysarthric and is a poor historian    Prior Function            PT Goals (current goals can now be found in the care plan section) Progress towards PT goals: Progressing toward goals    Frequency    Min 2X/week      PT Plan      Co-evaluation PT/OT/SLP Co-Evaluation/Treatment: Yes Reason for Co-Treatment: Complexity of the patient's impairments (multi-system involvement);For patient/therapist safety PT goals addressed during session: Mobility/safety with mobility;Strengthening/ROM;Balance OT goals addressed during session: ADL's and self-care;Other (comment) (functional cognition)      AM-PAC PT 6 Clicks Mobility   Outcome Measure  Help needed turning from your back to your side while in a flat bed without using bedrails?: A Lot Help needed moving from lying on your back to sitting on the side of a flat bed without using bedrails?: A Lot Help needed moving to and from a bed to a chair (including a wheelchair)?: Total Help needed standing up from a chair using your arms (e.g., wheelchair or bedside chair)?: Total Help needed to walk in hospital room?: Total Help needed climbing 3-5 steps with a railing? : Total 6 Click Score: 8    End of Session Equipment Utilized During Treatment: Gait belt Activity Tolerance: Patient limited by lethargy;Patient limited by fatigue Patient left: in bed;with call bell/phone within reach;with bed alarm  set Nurse Communication: Mobility status PT Visit Diagnosis: Other abnormalities of gait and mobility (R26.89);Muscle weakness (generalized) (M62.81);Hemiplegia and hemiparesis Hemiplegia - Right/Left: Left Hemiplegia - dominant/non-dominant: Non-dominant Hemiplegia - caused by: Cerebral infarction     Time: 1233-1257 PT Time Calculation (min) (ACUTE ONLY): 24 min  Charges:    $Gait Training: 8-22 mins PT General Charges $$ ACUTE PT VISIT: 1 Visit                     Charae Depaolis B, PT, DPT Acute Rehab Services [phone]    Patrick [name] 06/09/2024, 3:52 PM

## 2024-06-09 NOTE — TOC CAGE-AID Note (Signed)
 Transition of Care [facility]) - CAGE-AID Screening   Patient Details  Name: Ryen Rhames MRN: [id number] Date of Birth: Apr 03, 1981  [facility]) CM/SW Contact:    Almarie CHRISTELLA Goodie, LCSW Phone Number: 06/09/2024, 9:22 AM   Clinical Narrative:   Patient oriented to self, not appropriate for assessment.    CAGE-AID Screening: Substance Abuse Screening unable to be completed due to: : Patient unable to participate

## 2024-06-09 NOTE — Plan of Care (Signed)
 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

## 2024-06-09 DEATH — deceased

## 2024-06-10 ENCOUNTER — Inpatient Hospital Stay (HOSPITAL_COMMUNITY)

## 2024-06-10 DIAGNOSIS — A523 Neurosyphilis, unspecified: Secondary | ICD-10-CM | POA: Diagnosis not present

## 2024-06-10 DIAGNOSIS — I358 Other nonrheumatic aortic valve disorders: Secondary | ICD-10-CM

## 2024-06-10 DIAGNOSIS — G9340 Encephalopathy, unspecified: Secondary | ICD-10-CM

## 2024-06-10 DIAGNOSIS — R7881 Bacteremia: Secondary | ICD-10-CM | POA: Diagnosis not present

## 2024-06-10 DIAGNOSIS — I330 Acute and subacute infective endocarditis: Secondary | ICD-10-CM

## 2024-06-10 DIAGNOSIS — B9562 Methicillin resistant Staphylococcus aureus infection as the cause of diseases classified elsewhere: Secondary | ICD-10-CM | POA: Diagnosis not present

## 2024-06-10 LAB — COMPREHENSIVE METABOLIC PANEL WITH GFR
ALT: 68 U/L — ABNORMAL HIGH (ref 0–44)
AST: 101 U/L — ABNORMAL HIGH (ref 15–41)
Albumin: 2.7 g/dL — ABNORMAL LOW (ref 3.5–5.0)
Alkaline Phosphatase: 208 U/L — ABNORMAL HIGH (ref 38–126)
Anion gap: 11 (ref 5–15)
BUN: 23 mg/dL — ABNORMAL HIGH (ref 6–20)
CO2: 24 mmol/L (ref 22–32)
Calcium: 8.2 mg/dL — ABNORMAL LOW (ref 8.9–10.3)
Chloride: 101 mmol/L (ref 98–111)
Creatinine, Ser: 0.72 mg/dL (ref 0.61–1.24)
GFR, Estimated: >60 mL/min
Glucose, Bld: 131 mg/dL — ABNORMAL HIGH (ref 70–99)
Potassium: 3.5 mmol/L (ref 3.5–5.1)
Sodium: 135 mmol/L (ref 135–145)
Total Bilirubin: 1.7 mg/dL — ABNORMAL HIGH (ref 0.0–1.2)
Total Protein: 6.8 g/dL (ref 6.5–8.1)

## 2024-06-10 LAB — CULTURE, BLOOD (ROUTINE X 2): Special Requests: ADEQUATE

## 2024-06-10 LAB — VANCOMYCIN, TROUGH: Vancomycin Tr: 7 ug/mL — ABNORMAL LOW (ref 15–20)

## 2024-06-10 LAB — MAGNESIUM: Magnesium: 2 mg/dL (ref 1.7–2.4)

## 2024-06-10 LAB — CBC
HCT: 36.9 % — ABNORMAL LOW (ref 39.0–52.0)
Hemoglobin: 13.1 g/dL (ref 13.0–17.0)
MCH: 29.2 pg (ref 26.0–34.0)
MCHC: 35.5 g/dL (ref 30.0–36.0)
MCV: 82.2 fL (ref 80.0–100.0)
Platelets: 145 K/uL — ABNORMAL LOW (ref 150–400)
RBC: 4.49 MIL/uL (ref 4.22–5.81)
RDW: 13.9 % (ref 11.5–15.5)
WBC: 23.1 K/uL — ABNORMAL HIGH (ref 4.0–10.5)
nRBC: 0 % (ref 0.0–0.2)

## 2024-06-10 LAB — T.PALLIDUM AB, TOTAL: T Pallidum Abs: REACTIVE — AB

## 2024-06-10 LAB — HEPATITIS B CORE ANTIBODY, TOTAL: HEP B CORE AB: POSITIVE — AB

## 2024-06-10 LAB — VANCOMYCIN, PEAK: Vancomycin Pk: 21 ug/mL — ABNORMAL LOW (ref 30–40)

## 2024-06-10 MED ORDER — DEXTROSE-SODIUM CHLORIDE 5-0.45 % IV SOLN: INTRAVENOUS | Status: AC

## 2024-06-10 MED ORDER — VANCOMYCIN HCL 1750 MG/350ML IV SOLN
1750.0000 mg | Freq: Two times a day (BID) | INTRAVENOUS | Status: DC
  Administered 2024-06-10 – 2024-06-13 (×6): 1750 mg via INTRAVENOUS
  Filled 2024-06-10 (×6): qty 350

## 2024-06-10 MED ORDER — LORAZEPAM 2 MG/ML IJ SOLN
0.5000 mg | Freq: Four times a day (QID) | INTRAMUSCULAR | Status: DC | PRN
  Administered 2024-06-11: 0.5 mg via INTRAVENOUS
  Filled 2024-06-10: qty 1

## 2024-06-10 MED ORDER — PENICILLIN G POTASSIUM 20000000 UNITS IJ SOLR
24.0000 10*6.[IU] | INTRAVENOUS | Status: DC
  Administered 2024-06-11 – 2024-06-14 (×4): 24 10*6.[IU] via INTRAVENOUS
  Filled 2024-06-10 (×3): qty 20
  Filled 2024-06-10 (×2): qty 24

## 2024-06-10 MED ORDER — HALOPERIDOL LACTATE 5 MG/ML IJ SOLN
1.0000 mg | Freq: Four times a day (QID) | INTRAMUSCULAR | Status: DC | PRN
  Administered 2024-06-10 – 2024-06-11 (×2): 1 mg via INTRAVENOUS
  Filled 2024-06-10 (×2): qty 1

## 2024-06-10 MED ORDER — PENICILLIN G POTASSIUM 20000000 UNITS IJ SOLR
4.0000 10*6.[IU] | Freq: Once | INTRAVENOUS | Status: AC
  Administered 2024-06-10: 4 10*6.[IU] via INTRAVENOUS
  Filled 2024-06-10: qty 4

## 2024-06-10 NOTE — Consult Note (Signed)
 Modified Barium Swallow Study  Patient Details  Name: Patrick [name] MRN: [id number] Date of Birth: [birth year]  Today's Date: 06/10/2024  Modified Barium Swallow completed.  Full report located under Chart Review in the Imaging Section.  History of Present Illness 44 yo male presenting to ED 12/30 with L facial droop and L arm weakness. UDS positive for amphetamines and fentanyl . MRI shows acute/subacute infraction in the R MCA territory with possible small hemorrhagic transformation in addition to acute infarcts in the L cerebellum and L temporal lobe. Not a candidate for TNK or thrombectomy. Concern for endocarditis. CT C/A/P shows RLL posterior basal consolidation and additional R upper and middle lobe peripheral consolidations, consistent with multifocal pneumonia. PMH includes polysubstance abuse, daily IV meth use. ST f/u for po readiness and dysarthria tx during acute stay; Pt currently NPO with MBS ordered to assess swallow function and determine safest diet.   Clinical Impression Pt presents with mild oropharyngeal dysphagia with cognitive impairment impacting swallow function intermittently.  Anterior interlabial escape with thin d/t L labial weakness, decreased oral preparation with thin escaping to the floor of the mouth.  Disorganized, repetitive mastication with solids and poor, limited dentition presence.  Swallow initiated at valleculae and/or pyriform sinuses d/t impulsivity depending on volume size, but anterior hyoid movement and laryngeal elevation adequate.  Pt did exhibit trace penetration to level of the vocal cords with ejection (PAS 4) with subsequent swallows, but ultimately able to protect the airway with thin/nectar-thick liquids with various volumes.  Diminished pharyngeal stripping wave resulting in posterior pharyngeal wall residue which cleared with liquid wash and multiple cued swallows.  Decreased tongue base retraction resulting in narrow column of contrast on PPW, but  cleared with compensatory strategies.  Esophageal retention with pill/puree in mid esophagus with liquid wash and repetitive swallows A with esophageal clearance.  Recommend initiate a conservative diet of Dysphagia 1(puree)/thin with FULL supervision/precautions in place during all po intake.  Medications provided crushed in puree for safety.  ST will f/u for diet progression/education and dysarthria tx during acute stay. Factors that may increase risk of adverse event in presence of aspiration Noe & Lianne 2021): Reduced cognitive function;Frail or deconditioned;Aspiration of thick, dense, and/or acidic materials;Frequent aspiration of large volumes;Presence of tubes (ETT, trach, NG, etc.) DIGEST Swallow Severity Rating*  Safety: 2  Efficiency:1  Overall Pharyngeal Swallow Severity: mild-moderate 1: mild; 2: moderate; 3: severe; 4: profound  *The Dynamic Imaging Grade of Swallowing Toxicity is standardized for the head and neck cancer population, however, demonstrates promising clinical applications across populations to standardize the clinical rating of pharyngeal swallow safety and severity.  Swallow Evaluation Recommendations Recommendations: PO diet PO Diet Recommendation: Dysphagia 1 (Pureed);Thin liquids (Level 0) Liquid Administration via: Cup;Straw;Other (Comment) (small guided sips) Medication Administration: Crushed with puree Supervision: Full supervision/cueing for swallowing strategies;Full assist for feeding Swallowing strategies  : Minimize environmental distractions;Slow rate;Small bites/sips;Multiple dry swallows after each bite/sip;Follow solids with liquids Postural changes: Position pt fully upright for meals Oral care recommendations: Oral care QID (4x/day)      Pat Chinara Hertzberg,M.S.,CCC-SLP 06/10/2024,1:57 PM

## 2024-06-10 NOTE — Plan of Care (Signed)
 Patient is anxious and agitated.  Starting IV Haldol and Ativan  as needed. Also implementing with restraint.  Continue for precaution.  Requesting bedside sitter as high risk for fall and agitation.   Vance Hochmuth, MD [facility] 06/10/2024, 4:27 AM

## 2024-06-10 NOTE — TOC Initial Note (Signed)
 Transition of Care [facility]) - Initial/Assessment Note    Patient Details  Name: Patrick [name] MRN: [id number] Date of Birth: 05-17-1981  Transition of Care [facility]) CM/SW Contact:    Almarie CHRISTELLA Goodie, LCSW Phone Number: 06/10/2024, 3:12 PM  Clinical Narrative:      CSW updated by Rehab Admissions that patient's family requesting SNF. CSW spoke with daughter, Judeth, to discuss and answer questions. Patient will remain in house for IV antibiotics due to drug history, and daughter in agreement. Daughter does not believe that the patient has been deemed disabled, she does not believe he gets a disability check, but she is not sure. CSW explained referral process and CMS choice list, and daughter indicated understanding. CSW to follow.             Expected Discharge Plan: Skilled Nursing Facility Barriers to Discharge: Continued Medical Work up, English As A Second Language Teacher, Inadequate or no insurance, Active Substance Use - Placement, Requiring sitter/restraints, Facility will not accept until restraint criteria met   Patient Goals and CMS Choice Patient states their goals for this hospitalization and ongoing recovery are:: patient unable to participate in goal setting, not fully oriented CMS Medicare.gov Compare Post Acute Care list provided to:: Patient Represenative (must comment) Choice offered to / list presented to : Adult Children [place] ownership interest in [facility].provided to:: Adult Children    Expected Discharge Plan and Services     Post Acute Care Choice: Skilled Nursing Facility Living arrangements for the past 2 months: Mobile Home                                      Prior Living Arrangements/Services Living arrangements for the past 2 months: Mobile Home Lives with:: Self Patient language and need for interpreter reviewed:: No Do you feel safe going back to the place where you live?: Yes      Need for Family Participation in Patient Care: Yes  (Comment) Care giver support system in place?: No (comment)   Criminal Activity/Legal Involvement Pertinent to Current Situation/Hospitalization: No - Comment as needed  Activities of Daily Living   ADL Screening (condition at time of admission) Independently performs ADLs?: Yes (appropriate for developmental age) Is the patient deaf or have difficulty hearing?: No Does the patient have difficulty seeing, even when wearing glasses/contacts?: No Does the patient have difficulty concentrating, remembering, or making decisions?: No  Permission Sought/Granted Permission sought to share information with : Facility Medical Sales Representative, Family Supports Permission granted to share information with : Yes, Verbal Permission Granted  Share Information with NAME: Heaven  Permission granted to share info w AGENCY: SNF  Permission granted to share info w Relationship: Daughter     Emotional Assessment   Attitude/Demeanor/Rapport: Unable to Assess Affect (typically observed): Unable to Assess Orientation: : Oriented to Self, Oriented to Place Alcohol / Substance Use: Illicit Drugs Psych Involvement: No (comment)  Admission diagnosis:  Acute ischemic stroke (HCC) [I63.9] Acute encephalopathy [G93.40] Opiate overdose, undetermined intent, initial encounter (HCC) [T40.604A] Pneumonia of both lungs due to infectious organism, unspecified part of lung [J18.9] Sepsis, due to unspecified organism, unspecified whether acute organ dysfunction present [facility]) [A41.9] Patient Active Problem List   Diagnosis Date Noted   MRSA bacteremia 06/09/2024   Acute encephalopathy 06/08/2024   PCP:  Patient, No Pcp Per Pharmacy:   [id number] - [place], [place] - [address] AT .  [address] [place] [address] Phone: [phone] Fax: [phone]     Social Drivers of Health (SDOH) Social History: SDOH Screenings   Food Insecurity: Patient Unable To Answer  (06/08/2024)  Housing: Unknown (06/08/2024)  Transportation Needs: Patient Unable To Answer (06/08/2024)  Utilities: Patient Unable To Answer (06/08/2024)   SDOH Interventions:     Readmission Risk Interventions     No data to display

## 2024-06-10 NOTE — Progress Notes (Signed)
 Physical Therapy Treatment Patient Details Name: Patrick [name] MRN: [id number] DOB: 08-22-1980 Today's Date: 06/10/2024   History of Present Illness Pt is 44 year old presented to [facility] on  06/07/24 for decr responsiveness and lt sided weakness. Suspected drug overdose. CT showed acute/subacute rt MCA infarct. MRI showed the same as well as mild hemorrhagic transformation and acute infarct in lt cerebellar hemisphere. Pt also with severe sepsis and PNA. PMH - arthritis, substance abuse    PT Comments  Pt with poor tolerance to treatment today. Co-treat with OT. Pt today requiring +2 total A for bed mobility and up to +2 Max A for sitting balance. Pt was given haldol this AM however pt was noted with worsening dysarthria and LUE weakness compared to yesterday. RN and MD made aware. Per SW note, family opting for SNF versus CIR therefore DC recs updated accordingly. PT will continue to follow.     If plan is discharge home, recommend the following: Two people to help with walking and/or transfers;A lot of help with bathing/dressing/bathroom;Direct supervision/assist for medications management;Direct supervision/assist for financial management;Assist for transportation   Can travel by private vehicle        Equipment Recommendations  Other (comment)    Recommendations for Other Services       Precautions / Restrictions Precautions Precautions: Fall Recall of Precautions/Restrictions: Impaired Restrictions Weight Bearing Restrictions Per Provider Order: No     Mobility  Bed Mobility Overal bed mobility: Needs Assistance Bed Mobility: Supine to Sit, Sit to Supine     Supine to sit: +2 for physical assistance, Total assist Sit to supine: +2 for physical assistance, Total assist   General bed mobility comments: +2 total A for bed mobility via helicopter method. Pt with poor initiation due to lethargy.    Transfers                   General transfer comment: deferred for  safety    Ambulation/Gait                   Stairs             Wheelchair Mobility     Tilt Bed    Modified Rankin (Stroke Patients Only) Modified Rankin (Stroke Patients Only) Pre-Morbid Rankin Score: No symptoms Modified Rankin: Severe disability     Balance Overall balance assessment: Needs assistance Sitting-balance support: Bilateral upper extremity supported, Single extremity supported, Feet supported Sitting balance-Leahy Scale: Zero Sitting balance - Comments: +2 Max A for sitting balance                                    Communication Communication Communication: Impaired Factors Affecting Communication: Reduced clarity of speech;Difficulty expressing self (Speech appears to be worse today compared to yesterday)  Cognition Arousal: Lethargic, Suspect due to medications (Given haldol this morning) Behavior During Therapy: Flat affect   PT - Cognitive impairments: Difficult to assess Difficult to assess due to: Level of arousal, Impaired communication                     PT - Cognition Comments: Pt with eyes closed throughout. Will answer questions but extremely difficult to understand due to dysarthria. Was given haldol this AM. Following commands: Impaired Following commands impaired: Follows one step commands inconsistently, Follows one step commands with increased time    Cueing Cueing Techniques: Verbal cues, Tactile cues, Gestural  cues  Exercises Other Exercises Other Exercises: Lateral props x5 each side    General Comments        Pertinent Vitals/Pain Pain Assessment Pain Assessment: Faces Faces Pain Scale: Hurts a little bit Pain Location: generalized Pain Descriptors / Indicators: Discomfort, Grimacing    Home Living                          Prior Function            PT Goals (current goals can now be found in the care plan section) Progress towards PT goals: Not progressing toward goals  - comment    Frequency    Min 2X/week      PT Plan      Co-evaluation              AM-PAC PT 6 Clicks Mobility   Outcome Measure  Help needed turning from your back to your side while in a flat bed without using bedrails?: A Lot Help needed moving from lying on your back to sitting on the side of a flat bed without using bedrails?: A Lot Help needed moving to and from a bed to a chair (including a wheelchair)?: Total Help needed standing up from a chair using your arms (e.g., wheelchair or bedside chair)?: Total Help needed to walk in hospital room?: Total Help needed climbing 3-5 steps with a railing? : Total 6 Click Score: 8    End of Session   Activity Tolerance: Patient limited by lethargy;Patient limited by fatigue Patient left: in bed;with call bell/phone within reach;with bed alarm set;with restraints reapplied;with family/visitor present;with nursing/sitter in room Nurse Communication: Mobility status;Other (comment) (Change in status) PT Visit Diagnosis: Other abnormalities of gait and mobility (R26.89);Muscle weakness (generalized) (M62.81);Hemiplegia and hemiparesis Hemiplegia - Right/Left: Left Hemiplegia - dominant/non-dominant: Non-dominant Hemiplegia - caused by: Cerebral infarction     Time: 8555-8492 PT Time Calculation (min) (ACUTE ONLY): 23 min  Charges:    $Therapeutic Activity: 8-22 mins PT General Charges $$ ACUTE PT VISIT: 1 Visit                     Yalexa Blust B, PT, DPT Acute Rehab Services [phone]    Cristel Rail 06/10/2024, 4:30 PM

## 2024-06-10 NOTE — Progress Notes (Signed)
 MEWS Progress Note  Patient Details Name: Anthonio Mizzell MRN: [id number] DOB: 1980/07/08 Today's Date: 06/10/2024   MEWS Flowsheet Documentation:  Assess: MEWS Score Temp: (!) 102.7 F (39.3 C) BP: (!) 144/71 MAP (mmHg): 91 Pulse Rate: (!) 107 ECG Heart Rate: 95 Resp: (!) 24 Level of Consciousness: Alert SpO2: 97 % O2 Device: Room Air Patient Activity (if Appropriate): In bed Assess: MEWS Score MEWS Temp: 2 MEWS Systolic: 0 MEWS Pulse: 1 MEWS RR: 1 MEWS LOC: 0 MEWS Score: 4 MEWS Score Color: Red Assess: SIRS CRITERIA SIRS Temperature : 1 SIRS Respirations : 1 SIRS Pulse: 1 SIRS WBC: 0 SIRS Score Sum : 3 SIRS Temperature : 0 SIRS Pulse: 1 SIRS Respirations : 0 SIRS WBC: 1 SIRS Score Sum : 2 Assess: if the MEWS score is Yellow or Red Were vital signs accurate and taken at a resting state?: Yes Does the patient meet 2 or more of the SIRS criteria?: Yes Does the patient have a confirmed or suspected source of infection?: Yes MEWS guidelines implemented : Yes, red Treat MEWS Interventions: Considered administering scheduled or prn medications/treatments as ordered Take Vital Signs Increase Vital Sign Frequency : Red: Q1hr x2, continue Q4hrs until patient remains green for 12hrs Escalate MEWS: Escalate: Red: Discuss with charge nurse and notify provider. Consider notifying RRT. If remains red for 2 hours consider need for higher level of care Notify: Charge Nurse/RN Name of Charge Nurse/RN Notified: Lindi, RN Provider Notification Provider Name/Title: Dr. Trixie Date Provider Notified: 06/10/24 Time Provider Notified: 1633 Method of Notification: Page Notification Reason: Other (Comment) (Red MEWS) Provider response: At bedside, No new orders Date of Provider Response: 06/10/24 Time of Provider Response: 1634      Gilmore SQUIBB Tavious Griesinger 06/10/2024, 4:57 PM

## 2024-06-10 NOTE — Progress Notes (Signed)
 Pharmacy Antibiotic Note  Patrick [name] is a 44 y.o. 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  Pharmacy has been consulted for vancomycin dosing.   Vancomycin peak 21, trough 7 - calculated AUC subtherapeutic at 370.4. Renal function improved. WBC up to 23.1, afebrile, and repeat BCx ngtd. Plan is for TEE on 1/5.  Plan: Increase vancomycin 1750 mg IV q12h Estimated AUC: 516.1 Trend WBC, temp, renal function  F/U infectious work-up Vancomycin levels at steady-state   Height: 5' 11 (180.3 cm) Weight: 71.7 kg (158 lb 1.1 oz) IBW/kg (Calculated) : 75.3  Temp (24hrs), Avg:98.3 F (36.8 C), Min:97.6 F (36.4 C), Max:98.9 F (37.2 C)  Recent Labs  Lab 06/07/24 2331 06/07/24 2335 06/08/24 0123 06/08/24 0337 06/08/24 0828 06/10/24 0148 06/10/24 0958  WBC 20.1*  --   --   --   --  23.1*  --   CREATININE 1.00 1.10  --   --   --  0.72  --   LATICACIDVEN  --   --  2.5* 2.6* 1.5  --   --   VANCOTROUGH  --   --   --   --   --   --  7*  VANCOPEAK  --   --   --   --   --  21*  --     Estimated Creatinine Clearance: 122 mL/min (by C-G formula based on SCr of 0.72 mg/dL).    Allergies[1]  Vanc 12/31 >>   12/31 bcx: 4/4 MRSA 12/31 RVP: neg 1/1 BCx: ngtd HIV NR  Thank you for involving pharmacy in this patient's care.  Delon Sax, PharmD, BCPS Clinical Pharmacist Clinical phone for 06/10/2024 is [phone] 06/10/2024 11:12 AM         [1] No Known Allergies

## 2024-06-10 NOTE — Progress Notes (Signed)
 Inpatient Rehab Admissions Coordinator:  Consult received. Per NSG recommendation attempted to contact pt's family to discuss CIR goals and expectations. Called pt's sister Deborrah and left a voicemail. Initially pt's daughter Judeth didn't answer, but she returned AC's call. Discussed CIR goals and expectations. Discussed average length of stay, insurance authorization requirement and discharge home after completion of CIR. She acknowledged understanding. Heaven called AC back after initial conversation ended and informed AC that spoke with family. They feel SNF rehab would be best for pt. TOC made aware.  Tinnie Yvone Cohens, MS, CCC-SLP Admissions Coordinator [phone]

## 2024-06-10 NOTE — Progress Notes (Addendum)
 " PROGRESS NOTE  Patrick [name] FMW:[id number] DOB: [birth year] DOA: 06/07/2024 PCP: Patient, No Pcp Per   LOS: 2 days   Brief Narrative / Interim history: 44 year old male with history of polysubstance abuse/IVDU who comes into the hospital with left facial droop, left upper extremity weakness and confusion.  He was found to have acute/subacute stroke on MRI in the setting of MRSA bacteremia and possible aortic valve vegetation on 2D echo.  Neurology, ID consulted  Subjective / 24h Interval events: He is more alert today, with dysarthria, tries to speak but he is unintelligible.  He is also becoming more agitated, has a sitter this morning  Assesement and Plan: Principal problem Sepsis due to MRSA bacteremia -patient febrile to one 1.4 on arrival, tachycardic, tachypneic, found to have MRSA bacteremia.  There is high suspicion for infective endocarditis due to CVA caused by presumed septic emboli.  CT scan of the chest also showed multifocal pneumonia - Currently he is on vancomycin.  ID consulted and following.  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  Imaging on admission with MRI showed acute/subacute infarction in the right frontoparietal junction/insular region on the right MCA territory with mild hemorrhagic transformation.  It also showed acute infarct in the left cerebellar hemisphere and small acute lacunar infarcts in the left temporal lobe.  CT angiogram without LVO.  Underwent a 2D echocardiogram which was unable to rule out oscillating density on the aortic valve.  TEE is pending, I have asked cardiology to discuss with the patient's daughter for consent as he is not consentable at this point - A1c 6.0, LDL 5 - Continues to have left hemiparesis and dysarthria.  PT recommends CIR but not ready yet  Prediabetes-A1c 6.0.  Monitor  IVDU -there are no significant withdrawal symptoms currently, he is  drowsy currently  Elevated blood pressure -not taking any medications, no history of HTN.  May need initiation if remains persistent  Dysphagia -SLP consulted, to evaluate today, may need a core track  Scheduled Meds:  sodium chloride  flush  3 mL Intravenous Once   Continuous Infusions:  dextrose  5 % and 0.45 % NaCl 75 mL/hr at 06/10/24 0854   vancomycin 1,250 mg (06/10/24 1027)   PRN Meds:.acetaminophen  **OR** acetaminophen , buprenorphine-naloxone, haloperidol lactate, LORazepam , naLOXone (NARCAN)  injection  Current Outpatient Medications  Medication Instructions   acetaminophen  (TYLENOL ) 1,000 mg, Oral, Every 6 hours PRN   albuterol (PROVENTIL HFA;VENTOLIN HFA) 108 (90 Base) MCG/ACT inhaler 2 puffs, Inhalation, Every 6 hours PRN   amoxicillin-clavulanate (AUGMENTIN) 875-125 MG tablet 1 tablet, 2 times daily    Diet Orders (From admission, onward)     Start     Ordered   06/08/24 1616  Diet NPO time specified Except for: Ice Chips  Diet effective now       Comments: Meds crushed in applesauce  Question:  Except for  Answer:  Ice Chips   06/08/24 1615            DVT prophylaxis: SCDs Start: 06/08/24 0540   Lab Results  Component Value Date   PLT 145 (L) 06/10/2024      Code Status: Full Code  Family Communication: No family at bedside, spoke with daughter Judeth [phone] yesterday, could not reach today  Status is: Inpatient Remains inpatient appropriate because: Severity of illness   Level of care: Progressive  Consultants:  Neurology ID  Objective: Vitals:   06/09/24  2000 06/10/24 0021 06/10/24 0417 06/10/24 0807  BP: (!) 140/80 (!) 147/82 136/85 (!) 148/73  Pulse: 99 (!) 102 (!) 102 61  Resp: 20 20 20 18   Temp: 98.3 F (36.8 C) 98.2 F (36.8 C) 97.6 F (36.4 C) 98.9 F (37.2 C)  TempSrc: Oral Oral Oral Oral  SpO2: 97% 99% 97% 100%  Weight:   71.7 kg   Height:        Intake/Output Summary (Last 24 hours) at 06/10/2024 1033 Last data  filed at 06/10/2024 0400 Gross per 24 hour  Intake --  Output 1650 ml  Net -1650 ml   Wt Readings from Last 3 Encounters:  06/10/24 71.7 kg    Examination:  Constitutional: NAD Eyes: lids and conjunctivae normal, no scleral icterus ENMT: mmm Neck: normal, supple Respiratory: clear to auscultation bilaterally, no wheezing, no crackles. Normal respiratory effort.  Cardiovascular: Regular rate and rhythm, no murmurs / rubs / gallops. No LE edema. Abdomen: soft, no distention, no tenderness. Bowel sounds positive.    Data Reviewed: I have independently reviewed following labs and imaging studies   CBC Recent Labs  Lab 06/07/24 2331 06/07/24 2334 06/07/24 2335 06/10/24 0148  WBC 20.1*  --   --  23.1*  HGB 15.1 15.6 16.3 13.1  HCT 43.4 46.0 48.0 36.9*  PLT 108*  --   --  145*  MCV 84.4  --   --  82.2  MCH 29.4  --   --  29.2  MCHC 34.8  --   --  35.5  RDW 13.7  --   --  13.9  LYMPHSABS 1.0  --   --   --   MONOABS 1.7*  --   --   --   EOSABS 0.0  --   --   --   BASOSABS 0.1  --   --   --     Recent Labs  Lab 06/07/24 2331 06/07/24 2334 06/07/24 2335 06/08/24 0123 06/08/24 0337 06/08/24 0513 06/08/24 0828 06/08/24 0903 06/10/24 0148  NA 128* 129* 130*  --   --   --   --   --  135  K 4.5 4.1 4.1  --   --   --   --   --  3.5  CL 91*  --  94*  --   --   --   --   --  101  CO2 25  --   --   --   --   --   --   --  24  GLUCOSE 143*  --  139*  --   --   --   --   --  131*  BUN 36*  --  37*  --   --   --   --   --  23*  CREATININE 1.00  --  1.10  --   --   --   --   --  0.72  CALCIUM 9.0  --   --   --   --   --   --   --  8.2*  AST 144*  --   --   --   --   --   --   --  101*  ALT 71*  --   --   --   --   --   --   --  68*  ALKPHOS 181*  --   --   --   --   --   --   --  208*  BILITOT 0.9  --   --   --   --   --   --   --  1.7*  ALBUMIN 3.3*  --   --   --   --   --   --   --  2.7*  MG  --   --   --   --   --   --   --   --  2.0  PROCALCITON  --   --   --   --   --   3.05  --   --   --   LATICACIDVEN  --   --   --  2.5* 2.6*  --  1.5  --   --   INR 1.3*  --   --   --   --   --   --   --   --   HGBA1C  --   --   --   --   --   --   --  6.0*  --   AMMONIA 28  --   --   --   --   --   --   --   --     ------------------------------------------------------------------------------------------------------------------ Recent Labs    06/08/24 0903  CHOL 74  HDL <10*  LDLCALC NOT CALCULATED  TRIG 209*  CHOLHDL NOT CALCULATED  LDLDIRECT 5    Lab Results  Component Value Date   HGBA1C 6.0 (H) 06/08/2024   ------------------------------------------------------------------------------------------------------------------ No results for input(s): TSH, T4TOTAL, T3FREE, THYROIDAB in the last 72 hours.  Invalid input(s): FREET3  Cardiac Enzymes No results for input(s): CKMB, TROPONINI, MYOGLOBIN in the last 168 hours.  Invalid input(s): CK ------------------------------------------------------------------------------------------------------------------ No results found for: BNP  CBG: Recent Labs  Lab 06/08/24 0006 06/08/24 2201  GLUCAP 161* 123*    Recent Results (from the past 240 hours)  Blood culture (routine x 2)     Status: Abnormal (Preliminary result)   Collection Time: 06/08/24 12:35 AM   Specimen: BLOOD RIGHT ARM  Result Value Ref Range Status   Specimen Description BLOOD RIGHT ARM  Final   Special Requests   Final    BOTTLES DRAWN AEROBIC AND ANAEROBIC Blood Culture adequate volume   Culture  Setup Time   Final    GRAM POSITIVE COCCI IN CLUSTERS IN BOTH AEROBIC AND ANAEROBIC BOTTLES CRITICAL RESULT CALLED TO, READ BACK BY AND VERIFIED WITH: PHARMD J.FRENS AT 1213 ON 06/08/2024 BY T.SAAD. Performed at [facility], [address], [place], [place] [address]    Culture METHICILLIN RESISTANT STAPHYLOCOCCUS AUREUS (A)  Final   Report Status PENDING  Incomplete   Organism ID, Bacteria METHICILLIN RESISTANT  STAPHYLOCOCCUS AUREUS  Final      Susceptibility   Methicillin resistant staphylococcus aureus - MIC*    CIPROFLOXACIN >=8 RESISTANT Resistant     ERYTHROMYCIN >=8 RESISTANT Resistant     GENTAMICIN <=0.5 SENSITIVE Sensitive     OXACILLIN >=4 RESISTANT Resistant     TETRACYCLINE <=1 SENSITIVE Sensitive     VANCOMYCIN 1 SENSITIVE Sensitive     TRIMETH/SULFA >=320 RESISTANT Resistant     CLINDAMYCIN >=8 RESISTANT Resistant     RIFAMPIN <=0.5 SENSITIVE Sensitive     Inducible Clindamycin NEGATIVE Sensitive     LINEZOLID 2 SENSITIVE Sensitive     * METHICILLIN RESISTANT STAPHYLOCOCCUS AUREUS  Blood Culture ID Panel (Reflexed)     Status: Abnormal   Collection Time: 06/08/24 12:35 AM  Result Value Ref Range Status   Enterococcus faecalis NOT DETECTED NOT DETECTED Final   Enterococcus Faecium NOT DETECTED NOT DETECTED Final   Listeria monocytogenes NOT DETECTED NOT DETECTED Final   Staphylococcus species DETECTED (A) NOT DETECTED Final    Comment: CRITICAL RESULT CALLED TO, READ BACK BY AND VERIFIED WITH: PHARMD J.FRENS AT 1213 ON 06/08/2024 BY T.SAAD.    Staphylococcus aureus (BCID) DETECTED (A) NOT DETECTED Final    Comment: Methicillin (oxacillin)-resistant Staphylococcus aureus (MRSA). MRSA is predictably resistant to beta-lactam antibiotics (except ceftaroline). Preferred therapy is vancomycin unless clinically contraindicated. Patient requires contact precautions if  hospitalized. CRITICAL RESULT CALLED TO, READ BACK BY AND VERIFIED WITH: PHARMD J.FRENS AT 1213 ON 06/08/2024 BY T.SAAD.    Staphylococcus epidermidis NOT DETECTED NOT DETECTED Final   Staphylococcus lugdunensis NOT DETECTED NOT DETECTED Final   Streptococcus species NOT DETECTED NOT DETECTED Final   Streptococcus agalactiae NOT DETECTED NOT DETECTED Final   Streptococcus pneumoniae NOT DETECTED NOT DETECTED Final   Streptococcus pyogenes NOT DETECTED NOT DETECTED Final   A.calcoaceticus-baumannii NOT DETECTED NOT  DETECTED Final   Bacteroides fragilis NOT DETECTED NOT DETECTED Final   Enterobacterales NOT DETECTED NOT DETECTED Final   Enterobacter cloacae complex NOT DETECTED NOT DETECTED Final   Escherichia coli NOT DETECTED NOT DETECTED Final   Klebsiella aerogenes NOT DETECTED NOT DETECTED Final   Klebsiella oxytoca NOT DETECTED NOT DETECTED Final   Klebsiella pneumoniae NOT DETECTED NOT DETECTED Final   Proteus species NOT DETECTED NOT DETECTED Final   Salmonella species NOT DETECTED NOT DETECTED Final   Serratia marcescens NOT DETECTED NOT DETECTED Final   Haemophilus influenzae NOT DETECTED NOT DETECTED Final   Neisseria meningitidis NOT DETECTED NOT DETECTED Final   Pseudomonas aeruginosa NOT DETECTED NOT DETECTED Final   Stenotrophomonas maltophilia NOT DETECTED NOT DETECTED Final   Candida albicans NOT DETECTED NOT DETECTED Final   Candida auris NOT DETECTED NOT DETECTED Final   Candida glabrata NOT DETECTED NOT DETECTED Final   Candida krusei NOT DETECTED NOT DETECTED Final   Candida parapsilosis NOT DETECTED NOT DETECTED Final   Candida tropicalis NOT DETECTED NOT DETECTED Final   Cryptococcus neoformans/gattii NOT DETECTED NOT DETECTED Final   Meth resistant mecA/C and MREJ DETECTED (A) NOT DETECTED Final    Comment: CRITICAL RESULT CALLED TO, READ BACK BY AND VERIFIED WITH: PHARMD J.FRENS AT 1213 ON 06/08/2024 BY T.SAAD. Performed at [facility], [address], [place], [place] [address]   Blood culture (routine x 2)     Status: Abnormal   Collection Time: 06/08/24 12:40 AM   Specimen: BLOOD LEFT ARM  Result Value Ref Range Status   Specimen Description BLOOD LEFT ARM  Final   Special Requests   Final    BOTTLES DRAWN AEROBIC AND ANAEROBIC Blood Culture adequate volume   Culture  Setup Time   Final    GRAM POSITIVE COCCI IN CLUSTERS IN BOTH AEROBIC AND ANAEROBIC BOTTLES CRITICAL VALUE NOTED.  VALUE IS CONSISTENT WITH PREVIOUSLY REPORTED AND CALLED VALUE.    Culture  (A)  Final    STAPHYLOCOCCUS AUREUS SUSCEPTIBILITIES PERFORMED ON PREVIOUS CULTURE WITHIN THE LAST 5 DAYS. Performed at [facility], [address] [place], [place] [address]    Report Status 06/10/2024 FINAL  Final  Resp panel by RT-PCR (RSV, Flu A&B, Covid) Anterior Nasal Swab     Status: None   Collection Time: 06/08/24  3:54 AM   Specimen: Anterior Nasal Swab  Result Value  Ref Range Status   SARS Coronavirus 2 by RT PCR NEGATIVE NEGATIVE Final   Influenza A by PCR NEGATIVE NEGATIVE Final   Influenza B by PCR NEGATIVE NEGATIVE Final    Comment: (NOTE) The Xpert Xpress SARS-CoV-2/FLU/RSV plus assay is intended as an aid in the diagnosis of influenza from Nasopharyngeal swab specimens and should not be used as a sole basis for treatment. Nasal washings and aspirates are unacceptable for Xpert Xpress SARS-CoV-2/FLU/RSV testing.  Fact Sheet for Patients: bloggercourse.com  Fact Sheet for Healthcare Providers: seriousbroker.it  This test is not yet approved or cleared by the [place]  FDA and has been authorized for detection and/or diagnosis of SARS-CoV-2 by FDA under an Emergency Use Authorization (EUA). This EUA will remain in effect (meaning this test can be used) for the duration of the COVID-19 declaration under Section 564(b)(1) of the Act, 21 U.S.C. section 360bbb-3(b)(1), unless the authorization is terminated or revoked.     Resp Syncytial Virus by PCR NEGATIVE NEGATIVE Final    Comment: (NOTE) Fact Sheet for Patients: bloggercourse.com  Fact Sheet for Healthcare Providers: seriousbroker.it  This test is not yet approved or cleared by the [place]  FDA and has been authorized for detection and/or diagnosis of SARS-CoV-2 by FDA under an Emergency Use Authorization (EUA). This EUA will remain in effect (meaning this test can be used) for the duration of  the COVID-19 declaration under Section 564(b)(1) of the Act, 21 U.S.C. section 360bbb-3(b)(1), unless the authorization is terminated or revoked.  Performed at [facility], [address] [place], [place] [address]   Culture, blood (Routine X 2) w Reflex to ID Panel     Status: None (Preliminary result)   Collection Time: 06/09/24  5:36 AM   Specimen: BLOOD  Result Value Ref Range Status   Specimen Description BLOOD SITE NOT SPECIFIED  Final   Special Requests   Final    BOTTLES DRAWN AEROBIC AND ANAEROBIC Blood Culture results may not be optimal due to an inadequate volume of blood received in culture bottles   Culture   Final    NO GROWTH < 24 HOURS Performed at [facility], [address], [place], [place] [address]    Report Status PENDING  Incomplete  Culture, blood (Routine X 2) w Reflex to ID Panel     Status: None (Preliminary result)   Collection Time: 06/09/24  5:36 AM   Specimen: BLOOD  Result Value Ref Range Status   Specimen Description BLOOD SITE NOT SPECIFIED  Final   Special Requests   Final    BOTTLES DRAWN AEROBIC AND ANAEROBIC Blood Culture adequate volume   Culture   Final    NO GROWTH < 24 HOURS Performed at [facility], [address] [place], [place] [address]    Report Status PENDING  Incomplete     Radiology Studies: No results found.    Nilda Fendt, MD, PhD [facility]  Between 7 am - 7 pm I am available, please contact me via Amion (for emergencies) or Securechat (non urgent messages)  Between 7 pm - 7 am I am not available, please contact night coverage MD/APP via Amion  "

## 2024-06-10 NOTE — Progress Notes (Signed)
 "        [facility]  Date of Admission:  06/07/2024      Total days of antibiotics 2   Vancomycin   Penicillin 1/02 >           ASSESSMENT: Patrick [name] is a 44 y.o. male admitted with:   MRSA Bacteremia -  AV Vegetation on TTE -  CVA -  BCx + 12/30, repeats on 12/31 preliminarily negative. WBC 23.1K today. Findings c/w osler's nodes on palms of hand / Rt foot. TTE - Cannot R/O oscillating density on aortic valve;  suggest TEE to further assess. Planned for TEE to continue work up on 1/5.  Answering some questions better today. Moving all extremities with some notable weakness to the L hand. No obvious metastatic musculoskeletal infectious concerns today.  - continue vancomycin   Reactive RPR, 1:128 -  Called state and no other testing on file for RPR outside of what was collected during this admission. TPPA still pending at this time for confirmation but titer is pretty high for that to be false positive. No mucocutaneous manifestations. Has osler's nodes on palm Lt hand and right foot sole but no typical syphilis stigmata. No obvious genital ulcerations however glans is covered with urine bag.  Denies any visual affect though I would not rely on his history given intermittent confusion. I think given concurrent embolic acute CVAs from likely going to be difficult to interpret an LP here. 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  - start penicillin IV - plan 2 weeks to cover CNS involvement  - FU treponemal confirmation  - HIV 4th gen non-reactive  Reactive Hepatitis C Ab -  Reactive Hepatitis B cAb -  Negative hepatitis B surface antigen. Check hep b dna, sAb titer.  Hep C rna and genotype  Hep A ab titer (to assess if needs immunizations)  TB screening  SUD, IV Meth/fentanyl  documented -  Encephalopathic -  Will need to remain in  supervised setting for IV antibiotics   Isolation Recommendation -  Contact for MRSA    PLAN: Continue IV vancomycin  FU levels  Start IV penicillin to cover neurosyphilis  BCx 12/31 further labs to w/u hepatitis  TB screening with serum quantiferon (NO concerns for active disease, only health screening as part of SUD)    Principal Problem:   Acute encephalopathy Active Problems:   MRSA bacteremia    sodium chloride  flush  3 mL Intravenous Once    SUBJECTIVE: Nothing new.   Review of Systems: ROS  Allergies[1]  OBJECTIVE: Vitals:   06/09/24 2000 06/10/24 0021 06/10/24 0417 06/10/24 0807  BP: (!) 140/80 (!) 147/82 136/85 (!) 148/73  Pulse: 99 (!) 102 (!) 102 61  Resp: 20 20 20 18   Temp: 98.3 F (36.8 C) 98.2 F (36.8 C) 97.6 F (36.4 C) 98.9 F (37.2 C)  TempSrc: Oral Oral Oral Oral  SpO2: 97% 99% 97% 100%  Weight:   71.7 kg   Height:       Body mass index is 22.05 kg/m.  Physical Exam Constitutional:      Appearance: He is ill-appearing.  Cardiovascular:     Rate and Rhythm: Tachycardia present.     Heart sounds: No murmur heard. Pulmonary:     Effort: Pulmonary effort is normal.     Breath sounds: Normal breath sounds.  Abdominal:  General: Bowel sounds are normal. There is no distension.     Palpations: Abdomen is soft.  Skin:    Comments: Non-blanching petechial/nodular lesions to palms and sole of feet   Neurological:     Mental Status: He is disoriented.     Comments: Able to nod appropriately for questions      Lab Results Lab Results  Component Value Date   WBC 23.1 (H) 06/10/2024   HGB 13.1 06/10/2024   HCT 36.9 (L) 06/10/2024   MCV 82.2 06/10/2024   PLT 145 (L) 06/10/2024    Lab Results  Component Value Date   CREATININE 0.72 06/10/2024   BUN 23 (H) 06/10/2024   NA 135 06/10/2024   K 3.5 06/10/2024   CL 101 06/10/2024   CO2 24 06/10/2024    Lab Results  Component Value Date   ALT 68 (H) 06/10/2024   AST 101 (H)  06/10/2024   GGT 68 (H) 06/08/2024   ALKPHOS 208 (H) 06/10/2024   BILITOT 1.7 (H) 06/10/2024     Microbiology: Recent Results (from the past 240 hours)  Blood culture (routine x 2)     Status: Abnormal (Preliminary result)   Collection Time: 06/08/24 12:35 AM   Specimen: BLOOD RIGHT ARM  Result Value Ref Range Status   Specimen Description BLOOD RIGHT ARM  Final   Special Requests   Final    BOTTLES DRAWN AEROBIC AND ANAEROBIC Blood Culture adequate volume   Culture  Setup Time   Final    GRAM POSITIVE COCCI IN CLUSTERS IN BOTH AEROBIC AND ANAEROBIC BOTTLES CRITICAL RESULT CALLED TO, READ BACK BY AND VERIFIED WITH: PHARMD J.FRENS AT 1213 ON 06/08/2024 BY T.SAAD. Performed at [facility], [address], [place], [place] [address]    Culture METHICILLIN RESISTANT STAPHYLOCOCCUS AUREUS (A)  Final   Report Status PENDING  Incomplete   Organism ID, Bacteria METHICILLIN RESISTANT STAPHYLOCOCCUS AUREUS  Final      Susceptibility   Methicillin resistant staphylococcus aureus - MIC*    CIPROFLOXACIN >=8 RESISTANT Resistant     ERYTHROMYCIN >=8 RESISTANT Resistant     GENTAMICIN <=0.5 SENSITIVE Sensitive     OXACILLIN >=4 RESISTANT Resistant     TETRACYCLINE <=1 SENSITIVE Sensitive     VANCOMYCIN 1 SENSITIVE Sensitive     TRIMETH/SULFA >=320 RESISTANT Resistant     CLINDAMYCIN >=8 RESISTANT Resistant     RIFAMPIN <=0.5 SENSITIVE Sensitive     Inducible Clindamycin NEGATIVE Sensitive     LINEZOLID 2 SENSITIVE Sensitive     * METHICILLIN RESISTANT STAPHYLOCOCCUS AUREUS  Blood Culture ID Panel (Reflexed)     Status: Abnormal   Collection Time: 06/08/24 12:35 AM  Result Value Ref Range Status   Enterococcus faecalis NOT DETECTED NOT DETECTED Final   Enterococcus Faecium NOT DETECTED NOT DETECTED Final   Listeria monocytogenes NOT DETECTED NOT DETECTED Final   Staphylococcus species DETECTED (A) NOT DETECTED Final    Comment: CRITICAL RESULT CALLED TO, READ BACK BY AND VERIFIED  WITH: PHARMD J.FRENS AT 1213 ON 06/08/2024 BY T.SAAD.    Staphylococcus aureus (BCID) DETECTED (A) NOT DETECTED Final    Comment: Methicillin (oxacillin)-resistant Staphylococcus aureus (MRSA). MRSA is predictably resistant to beta-lactam antibiotics (except ceftaroline). Preferred therapy is vancomycin unless clinically contraindicated. Patient requires contact precautions if  hospitalized. CRITICAL RESULT CALLED TO, READ BACK BY AND VERIFIED WITH: PHARMD J.FRENS AT 1213 ON 06/08/2024 BY T.SAAD.    Staphylococcus epidermidis NOT DETECTED NOT DETECTED Final   Staphylococcus  lugdunensis NOT DETECTED NOT DETECTED Final   Streptococcus species NOT DETECTED NOT DETECTED Final   Streptococcus agalactiae NOT DETECTED NOT DETECTED Final   Streptococcus pneumoniae NOT DETECTED NOT DETECTED Final   Streptococcus pyogenes NOT DETECTED NOT DETECTED Final   A.calcoaceticus-baumannii NOT DETECTED NOT DETECTED Final   Bacteroides fragilis NOT DETECTED NOT DETECTED Final   Enterobacterales NOT DETECTED NOT DETECTED Final   Enterobacter cloacae complex NOT DETECTED NOT DETECTED Final   Escherichia coli NOT DETECTED NOT DETECTED Final   Klebsiella aerogenes NOT DETECTED NOT DETECTED Final   Klebsiella oxytoca NOT DETECTED NOT DETECTED Final   Klebsiella pneumoniae NOT DETECTED NOT DETECTED Final   Proteus species NOT DETECTED NOT DETECTED Final   Salmonella species NOT DETECTED NOT DETECTED Final   Serratia marcescens NOT DETECTED NOT DETECTED Final   Haemophilus influenzae NOT DETECTED NOT DETECTED Final   Neisseria meningitidis NOT DETECTED NOT DETECTED Final   Pseudomonas aeruginosa NOT DETECTED NOT DETECTED Final   Stenotrophomonas maltophilia NOT DETECTED NOT DETECTED Final   Candida albicans NOT DETECTED NOT DETECTED Final   Candida auris NOT DETECTED NOT DETECTED Final   Candida glabrata NOT DETECTED NOT DETECTED Final   Candida krusei NOT DETECTED NOT DETECTED Final   Candida parapsilosis  NOT DETECTED NOT DETECTED Final   Candida tropicalis NOT DETECTED NOT DETECTED Final   Cryptococcus neoformans/gattii NOT DETECTED NOT DETECTED Final   Meth resistant mecA/C and MREJ DETECTED (A) NOT DETECTED Final    Comment: CRITICAL RESULT CALLED TO, READ BACK BY AND VERIFIED WITH: PHARMD J.FRENS AT 1213 ON 06/08/2024 BY T.SAAD. Performed at [facility], [address], [place], [place] [address]   Blood culture (routine x 2)     Status: Abnormal   Collection Time: 06/08/24 12:40 AM   Specimen: BLOOD LEFT ARM  Result Value Ref Range Status   Specimen Description BLOOD LEFT ARM  Final   Special Requests   Final    BOTTLES DRAWN AEROBIC AND ANAEROBIC Blood Culture adequate volume   Culture  Setup Time   Final    GRAM POSITIVE COCCI IN CLUSTERS IN BOTH AEROBIC AND ANAEROBIC BOTTLES CRITICAL VALUE NOTED.  VALUE IS CONSISTENT WITH PREVIOUSLY REPORTED AND CALLED VALUE.    Culture (A)  Final    STAPHYLOCOCCUS AUREUS SUSCEPTIBILITIES PERFORMED ON PREVIOUS CULTURE WITHIN THE LAST 5 DAYS. Performed at [facility], [address] [place], [place] [address]    Report Status 06/10/2024 FINAL  Final  Resp panel by RT-PCR (RSV, Flu A&B, Covid) Anterior Nasal Swab     Status: None   Collection Time: 06/08/24  3:54 AM   Specimen: Anterior Nasal Swab  Result Value Ref Range Status   SARS Coronavirus 2 by RT PCR NEGATIVE NEGATIVE Final   Influenza A by PCR NEGATIVE NEGATIVE Final   Influenza B by PCR NEGATIVE NEGATIVE Final    Comment: (NOTE) The Xpert Xpress SARS-CoV-2/FLU/RSV plus assay is intended as an aid in the diagnosis of influenza from Nasopharyngeal swab specimens and should not be used as a sole basis for treatment. Nasal washings and aspirates are unacceptable for Xpert Xpress SARS-CoV-2/FLU/RSV testing.  Fact Sheet for Patients: bloggercourse.com  Fact Sheet for Healthcare Providers: seriousbroker.it  This test  is not yet approved or cleared by the [place]  FDA and has been authorized for detection and/or diagnosis of SARS-CoV-2 by FDA under an Emergency Use Authorization (EUA). This EUA will remain in effect (meaning this test can be used) for  the duration of the COVID-19 declaration under Section 564(b)(1) of the Act, 21 U.S.C. section 360bbb-3(b)(1), unless the authorization is terminated or revoked.     Resp Syncytial Virus by PCR NEGATIVE NEGATIVE Final    Comment: (NOTE) Fact Sheet for Patients: bloggercourse.com  Fact Sheet for Healthcare Providers: seriousbroker.it  This test is not yet approved or cleared by the [place]  FDA and has been authorized for detection and/or diagnosis of SARS-CoV-2 by FDA under an Emergency Use Authorization (EUA). This EUA will remain in effect (meaning this test can be used) for the duration of the COVID-19 declaration under Section 564(b)(1) of the Act, 21 U.S.C. section 360bbb-3(b)(1), unless the authorization is terminated or revoked.  Performed at [facility], [address], [place], [place] [address]   Culture, blood (Routine X 2) w Reflex to ID Panel     Status: None (Preliminary result)   Collection Time: 06/09/24  5:36 AM   Specimen: BLOOD  Result Value Ref Range Status   Specimen Description BLOOD SITE NOT SPECIFIED  Final   Special Requests   Final    BOTTLES DRAWN AEROBIC AND ANAEROBIC Blood Culture results may not be optimal due to an inadequate volume of blood received in culture bottles   Culture   Final    NO GROWTH < 24 HOURS Performed at [facility], [address], [place], [place] [address]    Report Status PENDING  Incomplete  Culture, blood (Routine X 2) w Reflex to ID Panel     Status: None (Preliminary result)   Collection Time: 06/09/24  5:36 AM   Specimen: BLOOD  Result Value Ref Range Status   Specimen Description BLOOD SITE NOT SPECIFIED   Final   Special Requests   Final    BOTTLES DRAWN AEROBIC AND ANAEROBIC Blood Culture adequate volume   Culture   Final    NO GROWTH < 24 HOURS Performed at [facility], [address] [place], [place] [address]    Report Status PENDING  Incomplete    Corean Fireman, MSN, NP-C [facility]  [email or web address] Pager: [phone] Office: [phone] [facility] Main Line: [phone] *Secure Chat Communication Welcome  Total Encounter Time:      [1] No Known Allergies  "

## 2024-06-10 NOTE — Progress Notes (Signed)
 Speech Language Pathology Treatment: Dysphagia  Patient Details Name: Patrick [name] MRN: [id number] DOB: 1980-10-15 Today's Date: 06/10/2024 Time: 8956-8894 SLP Time Calculation (min) (ACUTE ONLY): 22 min  Assessment / Plan / Recommendation Clinical Impression  Recommend NPO until objective assessment completed later this date.  Pt eager for po intake, but impulsive d/t cognitive impairment. ST will f/u for MBS.  Pt seen for dysphagia tx/readiness for MBS with pt mentation improved and able to answer simple questions/follow directives with verbal cues required for volume control/maintaining alertness level.  Pt consumed thin via tsp/cup sips and puree without overt s/s of aspiration present.  Decreased awareness/sustained attention impacting consumption of po intake cumulatively.  Pt required mod verbal/tactile cues and redirection during po trial. Decreased mastication d/t missing, poor dentition with disorganized chewing observed/lingual pumping intermittently.  Continue NPO status until objective assessment completed.   HPI HPI: 44 yo male presenting to ED 12/30 with L facial droop and L arm weakness. UDS positive for amphetamines and fentanyl . MRI shows acute/subacute infraction in the R MCA territory with possible small hemorrhagic transformation in addition to acute infarcts in the L cerebellum and L temporal lobe. Not a candidate for TNK or thrombectomy. Concern for endocarditis. CT C/A/P shows RLL posterior basal consolidation and additional R upper and middle lobe peripheral consolidations, consistent with multifocal pneumonia. PMH includes polysubstance abuse, daily IV meth use. ST f/u for po readiness and dysarthria tx during acute stay.      SLP Plan  New goals to be determined pending instrumental study        Swallow Evaluation Recommendations   Recommendations: Ice chips PRN after oral care;NPO Medication Administration: Crushed with puree Supervision: Full supervision/cueing  for swallowing strategies;Staff to assist with self-feeding Oral care recommendations: Oral care QID (4x/day)     Recommendations                     Oral care QID   Frequent or constant Supervision/Assistance Dysphagia, oropharyngeal phase (R13.12);Dysarthria and anarthria (R47.1)     New goals to be determined pending instrumental study     Patrick [name],M.S.,CCC-SLP  06/10/2024, 11:51 AM

## 2024-06-10 NOTE — Progress Notes (Addendum)
 Occupational Therapy Treatment Patient Details Name: Patrick [name] MRN: [id number] DOB: 22-Feb-1981 Today's Date: 06/10/2024   History of present illness Pt is 44 year old presented to [facility] on  06/07/24 for decr responsiveness and lt sided weakness. Suspected drug overdose. CT showed acute/subacute rt MCA infarct. MRI showed the same as well as mild hemorrhagic transformation and acute infarct in lt cerebellar hemisphere. Pt also with severe sepsis and PNA. PMH - arthritis, substance abuse   OT comments  Pt seen for OT treatment this PM. Co-tx with PT to optimize pt performance and safety. 1:1 NT at bedside. Pt appearing more lethargic today (RN reports he had not had haldol since 4 am) and with worsening LUE weakness (~1/5 to MMT, worse from eval yesterday - grossly 3/5 antigravity) and dysarthria. RN and MD notified. He required max-total A +2 for supine<>sit and up to mod/max A to maintain sitting EOB. With heavy L lateral lean, difficulty maintaining midline. Remains max-total A for ADLs. HR up to 129 with EOB activity. Updated d/c recs given family opting for lower intensity rehab option. OT to continue to follow.      If plan is discharge home, recommend the following:  Two people to help with walking and/or transfers;Two people to help with bathing/dressing/bathroom;Assistance with cooking/housework;Assistance with feeding;Direct supervision/assist for medications management;Direct supervision/assist for financial management;Assist for transportation;Help with stairs or ramp for entrance;Supervision due to cognitive status   Equipment Recommendations  Other (comment) (defer to next level of care)    Recommendations for Other Services      Precautions / Restrictions Precautions Precautions: Fall Recall of Precautions/Restrictions: Impaired Precaution/Restrictions Comments: posey abdominal restraint; dysarthric Restrictions Weight Bearing Restrictions Per Provider Order: No        Mobility Bed Mobility Overal bed mobility: Needs Assistance Bed Mobility: Supine to Sit, Sit to Supine     Supine to sit: Max assist, +2 for physical assistance Sit to supine: Total assist, +2 for physical assistance   General bed mobility comments: pt initiating moving RLE towards EOB, continual cues for sequencing, assist for execution, trunk elevation, and LLE mgmt to exit to R side    Transfers                   General transfer comment: deferred 2/2 poor sitting balance and lethargy     Balance Overall balance assessment: Needs assistance Sitting-balance support: Single extremity supported, Feet supported Sitting balance-Leahy Scale: Poor Sitting balance - Comments: heavily leaning to the L side today, incr difficulty maintaining midline despite visual cues; needs mod/max A to maintain Postural control: Left lateral lean                                 ADL either performed or assessed with clinical judgement   ADL Overall ADL's : Needs assistance/impaired Eating/Feeding: NPO                   Lower Body Dressing: Total assistance;Bed level Lower Body Dressing Details (indicate cue type and reason): donned B socks                    Extremity/Trunk Assessment Upper Extremity Assessment Upper Extremity Assessment: LUE deficits/detail LUE Deficits / Details: grossly 1/5 to MMT today LUE Coordination: decreased fine motor;decreased gross motor            Vision       Perception Perception Perception-Other Comments: suspect  L inattention   Praxis     Communication Communication Communication: Impaired Factors Affecting Communication: Difficulty expressing self;Reduced clarity of speech (feel dysarthria is worse today)   Cognition Arousal: Lethargic Behavior During Therapy: Flat affect, Restless, Anxious (intermittently anxious) Cognition: Cognition impaired   Orientation impairments:  (pt holding up 3 fingers when  asked the date, held up 1 finger when asked the month) Awareness: Intellectual awareness impaired Memory impairment (select all impairments): Short-term memory, Working civil service fast streamer, Engineer, structural memory Attention impairment (select first level of impairment): Sustained attention Executive functioning impairment (select all impairments): Initiation, Organization, Sequencing, Reasoning, Problem solving OT - Cognition Comments: impulsive, poor safety awareness, flat, delayed command following, requiring multimodal cueing                 Following commands: Impaired Following commands impaired: Follows one step commands inconsistently, Follows one step commands with increased time      Cueing   Cueing Techniques: Verbal cues, Tactile cues, Gestural cues  Exercises Exercises: Other exercises Other Exercises Other Exercises: Lateral weight-shifting EOB 1x5 to promote WB through LUE, trunk activation, and to challenge command following.    Shoulder Instructions       General Comments HR up to 129 during EOB activity; pt intermittently anxious with initiating bed mobility, but able to be calmed and was otherwise participatory/cooperative    Pertinent Vitals/ Pain       Pain Assessment Pain Assessment: Faces Faces Pain Scale: No hurt  Home Living                                          Prior Functioning/Environment              Frequency  Min 2X/week        Progress Toward Goals  OT Goals(current goals can now be found in the care plan section)  Progress towards OT goals: Progressing toward goals     Plan      Co-evaluation    PT/OT/SLP Co-Evaluation/Treatment: Yes Reason for Co-Treatment: Complexity of the patient's impairments (multi-system involvement);For patient/therapist safety   OT goals addressed during session: ADL's and self-care;Other (comment) (functional cognition)      AM-PAC OT 6 Clicks Daily Activity     Outcome  Measure   Help from another person eating meals?: Total Help from another person taking care of personal grooming?: A Lot Help from another person toileting, which includes using toliet, bedpan, or urinal?: Total Help from another person bathing (including washing, rinsing, drying)?: A Lot Help from another person to put on and taking off regular upper body clothing?: A Lot Help from another person to put on and taking off regular lower body clothing?: Total 6 Click Score: 9    End of Session    OT Visit Diagnosis: Unsteadiness on feet (R26.81);Other abnormalities of gait and mobility (R26.89);Muscle weakness (generalized) (M62.81);Feeding difficulties (R63.3)   Activity Tolerance Patient tolerated treatment well;Patient limited by lethargy   Patient Left in bed;with call bell/phone within reach;with bed alarm set;with nursing/sitter in room;with restraints reapplied (abdominal posey restraint)   Nurse Communication Mobility status;Other (comment) (change in pt status re: worsening LUE weakness and worsening dysarthria (MD also notified))        Time: 8554-8487 OT Time Calculation (min): 27 min  Charges: OT General Charges $OT Visit: 1 Visit OT Treatments $Neuromuscular Re-education: 8-22 mins  Harvey Lingo M. Michaelyn Wall, OTR/L  [facility] [phone] Secure Chat Preferred  Rikki Milch 06/10/2024, 4:36 PM

## 2024-06-10 NOTE — Progress Notes (Addendum)
" ° °  Cardiology was consulted to arrange TEE for further evaluation of MRSA bacteremia and CVA. He has a history of IV drug use and was noted to be in acute opiate withdrawal on admission. Reach out to nurse and patient is still confused and intermittently agitated/ irritable. Procedure is currently scheduled for Monday 06/13/2024 at 8:30am. I will go ahead an place NPO order. Our team will follow-up on mental status to see if we can consent him over the weekend. If not, we may have to reach out to family.   Bethel Sirois E Riker Collier, PA-C 06/10/2024 2:57 PM  ADDENDUM:  Per Dr. Trixie, we are likely going to have to get consent from daughter Paticia). I tried to call her (number listed in chart) but was unable to reach her. We will try again this weekend.  Elgin Carn E Master Touchet, PA-C 06/10/2024 4:48 PM  Daughter called back. The risks and benefits of transesophageal echocardiogram were explained including risks of esophageal damage, perforation (1:10,000 risk), bleeding, pharyngeal hematoma as well as other potential complications associated with conscious sedation including aspiration, arrhythmia, respiratory failure and death. Alternatives to treatment were discussed, questions were answered. Daughter agreed with proceed with procedure. Colleague Jene [place], [place]) confirmed consent. Consent form signed and will place in paper chart.  Explained to daughter that procedure may have to be delayed if he is still agitated on Monday but procedure tentatively planned for Monday 06/13/2024 at 8:30am. Will place pre-procedural orders.  Wilfrid Hyser E Rowan Pollman, PA-C 06/10/2024 5:18 PM    "

## 2024-06-11 DIAGNOSIS — G9340 Encephalopathy, unspecified: Secondary | ICD-10-CM | POA: Diagnosis not present

## 2024-06-11 DIAGNOSIS — R7881 Bacteremia: Secondary | ICD-10-CM | POA: Diagnosis not present

## 2024-06-11 DIAGNOSIS — A523 Neurosyphilis, unspecified: Secondary | ICD-10-CM | POA: Diagnosis not present

## 2024-06-11 DIAGNOSIS — I330 Acute and subacute infective endocarditis: Secondary | ICD-10-CM | POA: Diagnosis not present

## 2024-06-11 LAB — CBC
HCT: 33.5 % — ABNORMAL LOW (ref 39.0–52.0)
Hemoglobin: 12 g/dL — ABNORMAL LOW (ref 13.0–17.0)
MCH: 29.3 pg (ref 26.0–34.0)
MCHC: 35.8 g/dL (ref 30.0–36.0)
MCV: 81.9 fL (ref 80.0–100.0)
Platelets: 180 K/uL (ref 150–400)
RBC: 4.09 MIL/uL — ABNORMAL LOW (ref 4.22–5.81)
RDW: 13.9 % (ref 11.5–15.5)
WBC: 25 K/uL — ABNORMAL HIGH (ref 4.0–10.5)
nRBC: 0 % (ref 0.0–0.2)

## 2024-06-11 LAB — COMPREHENSIVE METABOLIC PANEL WITH GFR
ALT: 61 U/L — ABNORMAL HIGH (ref 0–44)
AST: 66 U/L — ABNORMAL HIGH (ref 15–41)
Albumin: 2.8 g/dL — ABNORMAL LOW (ref 3.5–5.0)
Alkaline Phosphatase: 172 U/L — ABNORMAL HIGH (ref 38–126)
Anion gap: 11 (ref 5–15)
BUN: 21 mg/dL — ABNORMAL HIGH (ref 6–20)
CO2: 23 mmol/L (ref 22–32)
Calcium: 7.7 mg/dL — ABNORMAL LOW (ref 8.9–10.3)
Chloride: 104 mmol/L (ref 98–111)
Creatinine, Ser: 0.84 mg/dL (ref 0.61–1.24)
GFR, Estimated: >60 mL/min
Glucose, Bld: 159 mg/dL — ABNORMAL HIGH (ref 70–99)
Potassium: 3.5 mmol/L (ref 3.5–5.1)
Sodium: 138 mmol/L (ref 135–145)
Total Bilirubin: 1.6 mg/dL — ABNORMAL HIGH (ref 0.0–1.2)
Total Protein: 7.4 g/dL (ref 6.5–8.1)

## 2024-06-11 LAB — MAGNESIUM: Magnesium: 2.1 mg/dL (ref 1.7–2.4)

## 2024-06-11 NOTE — Plan of Care (Signed)
" °  Problem: Ischemic Stroke/TIA Tissue Perfusion: Goal: Complications of ischemic stroke/TIA will be minimized Outcome: Progressing   Problem: Elimination: Goal: Will not experience complications related to bowel motility Outcome: Progressing Goal: Will not experience complications related to urinary retention Outcome: Progressing   Problem: Skin Integrity: Goal: Risk for impaired skin integrity will decrease Outcome: Progressing   Problem: Education: Goal: Knowledge of disease or condition will improve Outcome: Not Progressing Goal: Knowledge of secondary prevention will improve (MUST DOCUMENT ALL) Outcome: Not Progressing Goal: Knowledge of patient specific risk factors will improve (DELETE if not current risk factor) Outcome: Not Progressing   Problem: Coping: Goal: Will verbalize positive feelings about self Outcome: Not Progressing Goal: Will identify appropriate support needs Outcome: Not Progressing   Problem: Health Behavior/Discharge Planning: Goal: Ability to manage health-related needs will improve Outcome: Not Progressing Goal: Goals will be collaboratively established with patient/family Outcome: Not Progressing   Problem: Self-Care: Goal: Ability to participate in self-care as condition permits will improve Outcome: Not Progressing Goal: Verbalization of feelings and concerns over difficulty with self-care will improve Outcome: Not Progressing Goal: Ability to communicate needs accurately will improve Outcome: Not Progressing   Problem: Nutrition: Goal: Risk of aspiration will decrease Outcome: Not Progressing Goal: Dietary intake will improve Outcome: Not Progressing   Problem: Education: Goal: Knowledge of General Education information will improve Description: Including pain rating scale, medication(s)/side effects and non-pharmacologic comfort measures Outcome: Not Progressing   Problem: Health Behavior/Discharge Planning: Goal: Ability to manage  health-related needs will improve Outcome: Not Progressing   Problem: Clinical Measurements: Goal: Ability to maintain clinical measurements within normal limits will improve Outcome: Not Progressing Goal: Will remain free from infection Outcome: Not Progressing Goal: Diagnostic test results will improve Outcome: Not Progressing Goal: Respiratory complications will improve Outcome: Not Progressing Goal: Cardiovascular complication will be avoided Outcome: Not Progressing   Problem: Activity: Goal: Risk for activity intolerance will decrease Outcome: Not Progressing   Problem: Nutrition: Goal: Adequate nutrition will be maintained Outcome: Not Progressing   Problem: Coping: Goal: Level of anxiety will decrease Outcome: Not Progressing   Problem: Pain Managment: Goal: General experience of comfort will improve and/or be controlled Outcome: Not Progressing   Problem: Safety: Goal: Ability to remain free from injury will improve Outcome: Not Progressing   Problem: Safety: Goal: Non-violent Restraint(s) Outcome: Completed/Met   "

## 2024-06-11 NOTE — Significant Event (Signed)
 Rapid Response Event Note   Reason for Call :  Second set of eyes, asked to see during rounds  Resting in NAD upon entering room, wakens to voice. Follows commands, weak. Alert x4, dysarthric. Tachypneic shallow breathing, clear/diminished bilaterally. Skin hot to touch, rectal temp 103--rectal tylenol  given. Ice packs placed and fan to be placed once batteries ordered.  Brother at bedside.   158/77 (100) HR 110s RR 30-45 O2 93% RA   Tonna Chiquita POUR, RN

## 2024-06-11 NOTE — Progress Notes (Signed)
 " PROGRESS NOTE  Patrick [name] FMW:[id number] DOB: [birth year] DOA: 06/07/2024 PCP: Patient, No Pcp Per   LOS: 3 days   Brief Narrative / Interim history: 44 year old male with history of polysubstance abuse/IVDU who comes into the hospital with left facial droop, left upper extremity weakness and confusion.  He was found to have acute/subacute stroke on MRI in the setting of MRSA bacteremia and possible aortic valve vegetation on 2D echo.  Neurology, ID consulted  Subjective / 24h Interval events: Remains persistently febrile throughout the night, tachycardic, confused.  This morning seems to be mumbling and is unintelligible.  His twin brother is at bedside  Assesement and Plan: Principal problem Severe sepsis due to MRSA bacteremia -patient found to have MRSA bacteremia, with high suspicion for IE based on a 2D echo.  Brain imaging also showed to have multiple CVAs likely due to septic emboli.  CT scan of the chest also showed multifocal pneumonia - Patient remains with ongoing sepsis physiology with persistent fevers, tachycardia, confusion - ID following, continue antibiotics with vancomycin .  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  Imaging on admission with MRI showed acute/subacute infarction in the right frontoparietal junction/insular region on the right MCA territory with mild hemorrhagic transformation.  It also showed acute infarct in the left cerebellar hemisphere and small acute lacunar infarcts in the left temporal lobe.  CT angiogram without LVO.  A1c 6.0, LDL 5.  Underwent a 2D echocardiogram which was unable to rule out oscillating density on the aortic valve.  TEE is pending, presumed on Monday. - Continues to have left hemiparesis and dysarthria.  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  Unclear that he was ever diagnosed or treated for syphilis in the  past, therefore treating as for neurosyphilis with IV penicillin .  Continue penicillin   Hep B, C positivity -per ID  Prediabetes-A1c 6.0.  Monitor  IVDU -there are no significant withdrawal symptoms currently, he is drowsy currently  Elevated blood pressure -not taking any medications, no history of HTN.  May need initiation if remains persistent but now hold off in the setting of ongoing sepsis physiology  Dysphagia -SLP consulted, thankfully he passed evaluation and he is now on a dysphagia 1 diet  Scheduled Meds:  sodium chloride  flush  3 mL Intravenous Once   Continuous Infusions:  penicillin  G potassium 24 Million Units in dextrose  5 % 500 mL CONTINUOUS infusion     vancomycin  1,750 mg (06/11/24 0817)   PRN Meds:.acetaminophen  **OR** acetaminophen , buprenorphine -naloxone , haloperidol  lactate, LORazepam , naLOXone  (NARCAN )  injection  Current Outpatient Medications  Medication Instructions   acetaminophen  (TYLENOL ) 1,000 mg, Oral, Every 6 hours PRN   albuterol  (PROVENTIL  HFA;VENTOLIN  HFA) 108 (90 Base) MCG/ACT inhaler 2 puffs, Inhalation, Every 6 hours PRN   amoxicillin-clavulanate (AUGMENTIN) 875-125 MG tablet 1 tablet, 2 times daily    Diet Orders (From admission, onward)     Start     Ordered   06/13/24 0001  Diet NPO time specified  Diet effective midnight        06/10/24 1501   06/10/24 1310  DIET - DYS 1 Fluid consistency: Thin  Diet effective now       Question:  Fluid consistency:  Answer:  Thin   06/10/24 1309           DVT prophylaxis: SCDs Start: 06/08/24 0540  Lab Results  Component Value Date   PLT  180 06/11/2024     Code Status: Full Code  Family Communication: Twin brother present at bedside, spoke with daughter Judeth [phone] on Thursday  Status is: Inpatient Remains inpatient appropriate because: Severity of illness   Level of care: Progressive  Consultants:  Neurology ID  Objective: Vitals:   06/11/24 0819 06/11/24 0919  06/11/24 0940 06/11/24 0948  BP:  (!) 158/77    Pulse:    (!) 113  Resp:  (!) 32  (!) 32  Temp: (!) 100.7 F (38.2 C)  (!) 103.3 F (39.6 C)   TempSrc: Axillary  Rectal   SpO2:    93%  Weight:      Height:        Intake/Output Summary (Last 24 hours) at 06/11/2024 1128 Last data filed at 06/11/2024 0300 Gross per 24 hour  Intake 680 ml  Output 250 ml  Net 430 ml   Wt Readings from Last 3 Encounters:  06/10/24 71.7 kg    Examination:  Constitutional: Delirious, mumbling Eyes: lids and conjunctivae normal, no scleral icterus ENMT: mmm Neck: normal, supple Respiratory: Coarse breath sounds bilaterally, tachypneic Cardiovascular: Regular rate and rhythm, no murmurs / rubs / gallops. No LE edema.  Tachycardic Abdomen: soft, no distention, no tenderness. Bowel sounds positive.  Skin: no rashes Neurologic: Unable to assess due to not participating, intermittent tremors   Data Reviewed: I have independently reviewed following labs and imaging studies   CBC Recent Labs  Lab 06/07/24 2331 06/07/24 2334 06/07/24 2335 06/10/24 0148 06/11/24 0543  WBC 20.1*  --   --  23.1* 25.0*  HGB 15.1 15.6 16.3 13.1 12.0*  HCT 43.4 46.0 48.0 36.9* 33.5*  PLT 108*  --   --  145* 180  MCV 84.4  --   --  82.2 81.9  MCH 29.4  --   --  29.2 29.3  MCHC 34.8  --   --  35.5 35.8  RDW 13.7  --   --  13.9 13.9  LYMPHSABS 1.0  --   --   --   --   MONOABS 1.7*  --   --   --   --   EOSABS 0.0  --   --   --   --   BASOSABS 0.1  --   --   --   --     Recent Labs  Lab 06/07/24 2331 06/07/24 2334 06/07/24 2335 06/08/24 0123 06/08/24 0337 06/08/24 0513 06/08/24 0828 06/08/24 0903 06/10/24 0148 06/11/24 0543  NA 128* 129* 130*  --   --   --   --   --  135 138  K 4.5 4.1 4.1  --   --   --   --   --  3.5 3.5  CL 91*  --  94*  --   --   --   --   --  101 104  CO2 25  --   --   --   --   --   --   --  24 23  GLUCOSE 143*  --  139*  --   --   --   --   --  131* 159*  BUN 36*  --  37*  --   --    --   --   --  23* 21*  CREATININE 1.00  --  1.10  --   --   --   --   --  0.72 0.84  CALCIUM 9.0  --   --   --   --   --   --   --  8.2* 7.7*  AST 144*  --   --   --   --   --   --   --  101* 66*  ALT 71*  --   --   --   --   --   --   --  68* 61*  ALKPHOS 181*  --   --   --   --   --   --   --  208* 172*  BILITOT 0.9  --   --   --   --   --   --   --  1.7* 1.6*  ALBUMIN 3.3*  --   --   --   --   --   --   --  2.7* 2.8*  MG  --   --   --   --   --   --   --   --  2.0 2.1  PROCALCITON  --   --   --   --   --  3.05  --   --   --   --   LATICACIDVEN  --   --   --  2.5* 2.6*  --  1.5  --   --   --   INR 1.3*  --   --   --   --   --   --   --   --   --   HGBA1C  --   --   --   --   --   --   --  6.0*  --   --   AMMONIA 28  --   --   --   --   --   --   --   --   --     ------------------------------------------------------------------------------------------------------------------ No results for input(s): CHOL, HDL, LDLCALC, TRIG, CHOLHDL, LDLDIRECT in the last 72 hours.   Lab Results  Component Value Date   HGBA1C 6.0 (H) 06/08/2024   ------------------------------------------------------------------------------------------------------------------ No results for input(s): TSH, T4TOTAL, T3FREE, THYROIDAB in the last 72 hours.  Invalid input(s): FREET3  Cardiac Enzymes No results for input(s): CKMB, TROPONINI, MYOGLOBIN in the last 168 hours.  Invalid input(s): CK ------------------------------------------------------------------------------------------------------------------ No results found for: BNP  CBG: Recent Labs  Lab 06/08/24 0006 06/08/24 2201  GLUCAP 161* 123*    Recent Results (from the past 240 hours)  Blood culture (routine x 2)     Status: Abnormal (Preliminary result)   Collection Time: 06/08/24 12:35 AM   Specimen: BLOOD RIGHT ARM  Result Value Ref Range Status   Specimen Description BLOOD RIGHT ARM  Final   Special  Requests   Final    BOTTLES DRAWN AEROBIC AND ANAEROBIC Blood Culture adequate volume   Culture  Setup Time   Final    GRAM POSITIVE COCCI IN CLUSTERS IN BOTH AEROBIC AND ANAEROBIC BOTTLES CRITICAL RESULT CALLED TO, READ BACK BY AND VERIFIED WITH: PHARMD J.FRENS AT 1213 ON 06/08/2024 BY T.SAAD.    Culture (A)  Final    METHICILLIN RESISTANT STAPHYLOCOCCUS AUREUS Sent to [facility] for further susceptibility testing. Performed at [facility], [address], [place], [place] [address]    Report Status PENDING  Incomplete   Organism ID, Bacteria METHICILLIN RESISTANT STAPHYLOCOCCUS AUREUS  Final      Susceptibility   Methicillin resistant staphylococcus aureus - MIC*    CIPROFLOXACIN >=8 RESISTANT Resistant     ERYTHROMYCIN >=8 RESISTANT Resistant     GENTAMICIN <=0.5 SENSITIVE  Sensitive     OXACILLIN >=4 RESISTANT Resistant     TETRACYCLINE <=1 SENSITIVE Sensitive     VANCOMYCIN  1 SENSITIVE Sensitive     TRIMETH/SULFA >=320 RESISTANT Resistant     CLINDAMYCIN >=8 RESISTANT Resistant     RIFAMPIN <=0.5 SENSITIVE Sensitive     Inducible Clindamycin NEGATIVE Sensitive     LINEZOLID 2 SENSITIVE Sensitive     * METHICILLIN RESISTANT STAPHYLOCOCCUS AUREUS  Blood Culture ID Panel (Reflexed)     Status: Abnormal   Collection Time: 06/08/24 12:35 AM  Result Value Ref Range Status   Enterococcus faecalis NOT DETECTED NOT DETECTED Final   Enterococcus Faecium NOT DETECTED NOT DETECTED Final   Listeria monocytogenes NOT DETECTED NOT DETECTED Final   Staphylococcus species DETECTED (A) NOT DETECTED Final    Comment: CRITICAL RESULT CALLED TO, READ BACK BY AND VERIFIED WITH: PHARMD J.FRENS AT 1213 ON 06/08/2024 BY T.SAAD.    Staphylococcus aureus (BCID) DETECTED (A) NOT DETECTED Final    Comment: Methicillin (oxacillin)-resistant Staphylococcus aureus (MRSA). MRSA is predictably resistant to beta-lactam antibiotics (except ceftaroline). Preferred therapy is vancomycin  unless clinically  contraindicated. Patient requires contact precautions if  hospitalized. CRITICAL RESULT CALLED TO, READ BACK BY AND VERIFIED WITH: PHARMD J.FRENS AT 1213 ON 06/08/2024 BY T.SAAD.    Staphylococcus epidermidis NOT DETECTED NOT DETECTED Final   Staphylococcus lugdunensis NOT DETECTED NOT DETECTED Final   Streptococcus species NOT DETECTED NOT DETECTED Final   Streptococcus agalactiae NOT DETECTED NOT DETECTED Final   Streptococcus pneumoniae NOT DETECTED NOT DETECTED Final   Streptococcus pyogenes NOT DETECTED NOT DETECTED Final   A.calcoaceticus-baumannii NOT DETECTED NOT DETECTED Final   Bacteroides fragilis NOT DETECTED NOT DETECTED Final   Enterobacterales NOT DETECTED NOT DETECTED Final   Enterobacter cloacae complex NOT DETECTED NOT DETECTED Final   Escherichia coli NOT DETECTED NOT DETECTED Final   Klebsiella aerogenes NOT DETECTED NOT DETECTED Final   Klebsiella oxytoca NOT DETECTED NOT DETECTED Final   Klebsiella pneumoniae NOT DETECTED NOT DETECTED Final   Proteus species NOT DETECTED NOT DETECTED Final   Salmonella species NOT DETECTED NOT DETECTED Final   Serratia marcescens NOT DETECTED NOT DETECTED Final   Haemophilus influenzae NOT DETECTED NOT DETECTED Final   Neisseria meningitidis NOT DETECTED NOT DETECTED Final   Pseudomonas aeruginosa NOT DETECTED NOT DETECTED Final   Stenotrophomonas maltophilia NOT DETECTED NOT DETECTED Final   Candida albicans NOT DETECTED NOT DETECTED Final   Candida auris NOT DETECTED NOT DETECTED Final   Candida glabrata NOT DETECTED NOT DETECTED Final   Candida krusei NOT DETECTED NOT DETECTED Final   Candida parapsilosis NOT DETECTED NOT DETECTED Final   Candida tropicalis NOT DETECTED NOT DETECTED Final   Cryptococcus neoformans/gattii NOT DETECTED NOT DETECTED Final   Meth resistant mecA/C and MREJ DETECTED (A) NOT DETECTED Final    Comment: CRITICAL RESULT CALLED TO, READ BACK BY AND VERIFIED WITH: PHARMD J.FRENS AT 1213 ON 06/08/2024  BY T.SAAD. Performed at [facility], [address], [place], [place] [address]   MIC (1 Drug)-     Status: Abnormal (Preliminary result)   Collection Time: 06/08/24 12:35 AM  Result Value Ref Range Status   Min Inhibitory Conc (1 Drug) Preliminary report (A)  Final    Comment: (NOTE) Performed At: [facility] [address] [place], [place] [id number] Jennette Shorter MD Ey:[id number]    Source [phone] MRSA BLOOD DAPTOMYCIN  Final    Comment: Performed at [facility], [address] [place], [place] [address]  MIC Result     Status: Abnormal   Collection Time: 06/08/24 12:35 AM  Result Value Ref Range Status   Result 1 (MIC) Comment (A)  Final    Comment: (NOTE) Methicillin - resistant Staphylococcus aureus Identification performed by account, not confirmed by this laboratory. DAPTOMYCIN Performed At: [facility] [address] [place], [place] [id number] Jennette Shorter MD Ey:[id number]   Blood culture (routine x 2)     Status: Abnormal   Collection Time: 06/08/24 12:40 AM   Specimen: BLOOD LEFT ARM  Result Value Ref Range Status   Specimen Description BLOOD LEFT ARM  Final   Special Requests   Final    BOTTLES DRAWN AEROBIC AND ANAEROBIC Blood Culture adequate volume   Culture  Setup Time   Final    GRAM POSITIVE COCCI IN CLUSTERS IN BOTH AEROBIC AND ANAEROBIC BOTTLES CRITICAL VALUE NOTED.  VALUE IS CONSISTENT WITH PREVIOUSLY REPORTED AND CALLED VALUE.    Culture (A)  Final    STAPHYLOCOCCUS AUREUS SUSCEPTIBILITIES PERFORMED ON PREVIOUS CULTURE WITHIN THE LAST 5 DAYS. Performed at [facility], [address], [place], [place] [address]    Report Status 06/10/2024 FINAL  Final  Resp panel by RT-PCR (RSV, Flu A&B, Covid) Anterior Nasal Swab     Status: None   Collection Time: 06/08/24  3:54 AM   Specimen: Anterior Nasal Swab  Result Value Ref Range Status   SARS Coronavirus 2 by RT PCR NEGATIVE NEGATIVE Final   Influenza A by  PCR NEGATIVE NEGATIVE Final   Influenza B by PCR NEGATIVE NEGATIVE Final    Comment: (NOTE) The Xpert Xpress SARS-CoV-2/FLU/RSV plus assay is intended as an aid in the diagnosis of influenza from Nasopharyngeal swab specimens and should not be used as a sole basis for treatment. Nasal washings and aspirates are unacceptable for Xpert Xpress SARS-CoV-2/FLU/RSV testing.  Fact Sheet for Patients: bloggercourse.com  Fact Sheet for Healthcare Providers: seriousbroker.it  This test is not yet approved or cleared by the [place]  FDA and has been authorized for detection and/or diagnosis of SARS-CoV-2 by FDA under an Emergency Use Authorization (EUA). This EUA will remain in effect (meaning this test can be used) for the duration of the COVID-19 declaration under Section 564(b)(1) of the Act, 21 U.S.C. section 360bbb-3(b)(1), unless the authorization is terminated or revoked.     Resp Syncytial Virus by PCR NEGATIVE NEGATIVE Final    Comment: (NOTE) Fact Sheet for Patients: bloggercourse.com  Fact Sheet for Healthcare Providers: seriousbroker.it  This test is not yet approved or cleared by the [place]  FDA and has been authorized for detection and/or diagnosis of SARS-CoV-2 by FDA under an Emergency Use Authorization (EUA). This EUA will remain in effect (meaning this test can be used) for the duration of the COVID-19 declaration under Section 564(b)(1) of the Act, 21 U.S.C. section 360bbb-3(b)(1), unless the authorization is terminated or revoked.  Performed at [facility], [address] [place], [place] [address]   Culture, blood (Routine X 2) w Reflex to ID Panel     Status: None (Preliminary result)   Collection Time: 06/09/24  5:36 AM   Specimen: BLOOD  Result Value Ref Range Status   Specimen Description BLOOD SITE NOT SPECIFIED  Final   Special Requests    Final    BOTTLES DRAWN AEROBIC AND ANAEROBIC Blood Culture results may not be optimal due to an inadequate volume of blood received in culture bottles  Culture   Final    NO GROWTH 2 DAYS Performed at [facility], [address], [place], [place] [address]    Report Status PENDING  Incomplete  Culture, blood (Routine X 2) w Reflex to ID Panel     Status: None (Preliminary result)   Collection Time: 06/09/24  5:36 AM   Specimen: BLOOD  Result Value Ref Range Status   Specimen Description BLOOD SITE NOT SPECIFIED  Final   Special Requests   Final    BOTTLES DRAWN AEROBIC AND ANAEROBIC Blood Culture adequate volume   Culture   Final    NO GROWTH 2 DAYS Performed at [facility], [address], [place], [place] [address]    Report Status PENDING  Incomplete     Radiology Studies: DG Swallowing Func-Speech Pathology Result Date: 06/10/2024 Table formatting from the original result was not included. Modified Barium Swallow Study Patient Details Name: Jandiel Magallanes MRN: [id number] Date of Birth: [birth year] Today's Date: 06/10/2024 HPI/PMH: HPI: 44 yo male presenting to ED 12/30 with L facial droop and L arm weakness. UDS positive for amphetamines and fentanyl . MRI shows acute/subacute infraction in the R MCA territory with possible small hemorrhagic transformation in addition to acute infarcts in the L cerebellum and L temporal lobe. Not a candidate for TNK or thrombectomy. Concern for endocarditis. CT C/A/P shows RLL posterior basal consolidation and additional R upper and middle lobe peripheral consolidations, consistent with multifocal pneumonia. PMH includes polysubstance abuse, daily IV meth use. ST f/u for po readiness and dysarthria tx during acute stay; Pt currently NPO with MBS ordered to assess swallow function and determine safest diet. Clinical Impression: Clinical Impression: Pt presents with mild oropharyngeal dysphagia with cognitive impairment impacting swallow function  intermittently.  Anterior interlabial escape with thin d/t L labial weakness, decreased oral preparation with thin escaping to the floor of the mouth.  Disorganized, repetitive mastication with solids and poor, limited dentition presence.  Swallow initiated at valleculae or  pyriform sinuses d/t impulsivity depending on volume size, but anterior hyoid movement and laryngeal elevation adequate.  Pt did exhibit trace penetration to level of the vocal cords with ejection (PAS 4) with subsequent swallows, but ultimately able to protect the airway with thin/nectar-thick liquids with various volumes.  Diminished pharyngeal stripping wave resulting in posterior pharyngeal wall residue which cleared with liquid wash and multiple cued swallows.  Decreased tongue base retraction resulting in narrow column of contrast on PPW, but cleared with compensatory strategies.  Esophageal retention with pill/puree in mid esophagus with liquid wash and repetitive swallows A with esophageal clearance.  Recommend initiate a conservative diet of Dysphagia 1(puree)/thin with FULL supervision/precautions in place during all po intake.  Medications provided crushed in puree for safety.  ST will f/u for diet progression/education and dysarthria tx during acute stay. Factors that may increase risk of adverse event in presence of aspiration Noe & Lianne 2021): Factors that may increase risk of adverse event in presence of aspiration Noe & Lianne 2021): Reduced cognitive function; Frail or deconditioned; Aspiration of thick, dense, and/or acidic materials; Frequent aspiration of large volumes; Presence of tubes (ETT, trach, NG, etc.) DIGEST Swallow Severity Rating*  Safety: 2  Efficiency:1  Overall Pharyngeal Swallow Severity: mild-moderate 1: mild; 2: moderate; 3: severe; 4: profound *The Dynamic Imaging Grade of Swallowing Toxicity is standardized for the head and neck cancer population, however, demonstrates promising clinical  applications across populations to standardize the clinical rating of pharyngeal swallow safety and  severity. Recommendations/Plan: Swallowing Evaluation Recommendations Swallowing Evaluation Recommendations Recommendations: PO diet PO Diet Recommendation: Dysphagia 1 (Pureed); Thin liquids (Level 0) Liquid Administration via: Cup; Straw; Other (Comment) (small guided sips) Medication Administration: Crushed with puree Supervision: Full supervision/cueing for swallowing strategies; Full assist for feeding Swallowing strategies  : Minimize environmental distractions; Slow rate; Small bites/sips; Multiple dry swallows after each bite/sip; Follow solids with liquids Postural changes: Position pt fully upright for meals Oral care recommendations: Oral care QID (4x/day) Treatment Plan Treatment Plan Treatment recommendations: Therapy as outlined in treatment plan below Follow-up recommendations: Acute inpatient rehab (3 hours/day) Functional status assessment: Patient has had a recent decline in their functional status and demonstrates the ability to make significant improvements in function in a reasonable and predictable amount of time. Treatment frequency: Min 2x/week Treatment duration: 1 week Interventions: Aspiration precaution training; Compensatory techniques; Patient/family education; Trials of upgraded texture/liquids; Diet toleration management by SLP Recommendations Recommendations for follow up therapy are one component of a multi-disciplinary discharge planning process, led by the attending physician.  Recommendations may be updated based on patient status, additional functional criteria and insurance authorization. Assessment: Orofacial Exam: Orofacial Exam Oral Cavity - Dentition: Missing dentition; Poor condition Orofacial Anatomy: WFL Oral Motor/Sensory Function: Suspected cranial nerve impairment CN V - Trigeminal: Left sensory impairment Anatomy: Anatomy: WFL Boluses Administered: Boluses Administered  Boluses Administered: Thin liquids (Level 0); Mildly thick liquids (Level 2, nectar thick); Moderately thick liquids (Level 3, honey thick); Puree; Solid  Oral Impairment Domain: Oral Impairment Domain Lip Closure: Interlabial escape, no progression to anterior lip Tongue control during bolus hold: Escape to lateral buccal cavity/floor of mouth Bolus preparation/mastication: Disorganized chewing/mashing with solid pieces of bolus unchewed; Slow prolonged chewing/mashing with complete recollection Bolus transport/lingual motion: Repetitive/disorganized tongue motion; Slow tongue motion Oral residue: Trace residue lining oral structures Location of oral residue : Tongue Initiation of pharyngeal swallow : Valleculae; Posterior angle of the ramus; Pyriform sinuses  Pharyngeal Impairment Domain: Pharyngeal Impairment Domain Soft palate elevation: No bolus between soft palate (SP)/pharyngeal wall (PW) Laryngeal elevation: Complete superior movement of thyroid cartilage with complete approximation of arytenoids to epiglottic petiole Anterior hyoid excursion: Complete anterior movement Epiglottic movement: Complete inversion Laryngeal vestibule closure: Complete, no air/contrast in laryngeal vestibule Pharyngeal stripping wave : Present - diminished Pharyngeal contraction (A/P view only): N/A Pharyngoesophageal segment opening: Complete distension and complete duration, no obstruction of flow Tongue base retraction: Narrow column of contrast or air between tongue base and PPW Pharyngeal residue: Trace residue within or on pharyngeal structures Location of pharyngeal residue: Pharyngeal wall; Valleculae  Esophageal Impairment Domain: Esophageal Impairment Domain Esophageal clearance upright position: Esophageal retention Pill: Pill Consistency administered: Puree Puree: Impaired (see clinical impressions) Penetration/Aspiration Scale Score: Penetration/Aspiration Scale Score 1.  Material does not enter airway: Thin liquids  (Level 0); Moderately thick liquids (Level 3, honey thick); Puree; Solid; Pill 2.  Material enters airway, remains ABOVE vocal cords then ejected out: Thin liquids (Level 0) 4.  Material enters airway, CONTACTS cords then ejected out: Thin liquids (Level 0) Compensatory Strategies: Compensatory Strategies Compensatory strategies: Yes Straw: Effective Effective Straw: Thin liquid (Level 0) Multiple swallows: Effective Effective Multiple Swallows: Thin liquid (Level 0); Puree; Solid Liquid wash: Effective Effective Liquid Wash: Puree; Pill   General Information: Caregiver present: No  Diet Prior to this Study: NPO   Temperature : Normal   Respiratory Status: WFL   Supplemental O2: None (Room air)   History of Recent Intubation: No  Behavior/Cognition: Cooperative; Impulsive; Distractible Self-Feeding  Abilities: Able to self-feed; Needs assist with self-feeding; Other (Comment) (needs cues d/t cognitive impairment) Baseline vocal quality/speech: Dysphonic; Hypophonia/low volume Volitional Cough: Able to elicit Volitional Swallow: Able to elicit Exam Limitations: No limitations Goal Planning: Prognosis for improved oropharyngeal function: Good Barriers to Reach Goals: Cognitive deficits No data recorded Patient/Family Stated Goal: Eat jello Consulted and agree with results and recommendations: Patient; Nurse; Physician Pain: Pain Assessment Pain Assessment: Faces Faces Pain Scale: 2 Breathing: 0 Negative Vocalization: 1 Facial Expression: 1 Body Language: 1 Consolability: 1 PAINAD Score: 4 Facial Expression: 0 Body Movements: 2 Muscle Tension: 0 Compliance with ventilator (intubated pts.): N/A Vocalization (extubated pts.): 1 CPOT Total: 3 Pain Location: generalized Pain Descriptors / Indicators: Discomfort; Grimacing Pain Intervention(s): Limited activity within patient's tolerance; Monitored during session; Repositioned End of Session: Start Time:SLP Start Time (ACUTE ONLY): 1222 Stop Time: SLP Stop Time (ACUTE ONLY):  1245 Time Calculation:SLP Time Calculation (min) (ACUTE ONLY): 23 min Charges: SLP Evaluations $ SLP Speech Visit: 1 Visit SLP Evaluations $MBS Swallow: 1 Procedure $Swallowing Treatment: 1 Procedure $Speech Treatment for Individual: 1 Procedure SLP visit diagnosis: SLP Visit Diagnosis: Dysphagia, oropharyngeal phase (R13.12) Past Medical History: Past Medical History: Diagnosis Date  Arthritis  Past Surgical History: No past surgical history on file. Pat Adams,M.S.,CCC-SLP 06/10/2024, 1:52 PM     Nilda Fendt, MD, PhD [facility]  Between 7 am - 7 pm I am available, please contact me via Amion (for emergencies) or Securechat (non urgent messages)  Between 7 pm - 7 am I am not available, please contact night coverage MD/APP via Amion  "

## 2024-06-12 DIAGNOSIS — G9340 Encephalopathy, unspecified: Secondary | ICD-10-CM | POA: Diagnosis not present

## 2024-06-12 DIAGNOSIS — A523 Neurosyphilis, unspecified: Secondary | ICD-10-CM | POA: Diagnosis not present

## 2024-06-12 DIAGNOSIS — R7881 Bacteremia: Secondary | ICD-10-CM | POA: Diagnosis not present

## 2024-06-12 DIAGNOSIS — I330 Acute and subacute infective endocarditis: Secondary | ICD-10-CM | POA: Diagnosis not present

## 2024-06-12 LAB — COMPREHENSIVE METABOLIC PANEL WITH GFR
ALT: 44 U/L (ref 0–44)
AST: 58 U/L — ABNORMAL HIGH (ref 15–41)
Albumin: 2.7 g/dL — ABNORMAL LOW (ref 3.5–5.0)
Alkaline Phosphatase: 148 U/L — ABNORMAL HIGH (ref 38–126)
Anion gap: 11 (ref 5–15)
BUN: 26 mg/dL — ABNORMAL HIGH (ref 6–20)
CO2: 19 mmol/L — ABNORMAL LOW (ref 22–32)
Calcium: 7.8 mg/dL — ABNORMAL LOW (ref 8.9–10.3)
Chloride: 107 mmol/L (ref 98–111)
Creatinine, Ser: 0.8 mg/dL (ref 0.61–1.24)
GFR, Estimated: >60 mL/min
Glucose, Bld: 139 mg/dL — ABNORMAL HIGH (ref 70–99)
Potassium: 3.9 mmol/L (ref 3.5–5.1)
Sodium: 136 mmol/L (ref 135–145)
Total Bilirubin: 1.4 mg/dL — ABNORMAL HIGH (ref 0.0–1.2)
Total Protein: 7.8 g/dL (ref 6.5–8.1)

## 2024-06-12 LAB — CBC
HCT: 34 % — ABNORMAL LOW (ref 39.0–52.0)
Hemoglobin: 11.7 g/dL — ABNORMAL LOW (ref 13.0–17.0)
MCH: 29 pg (ref 26.0–34.0)
MCHC: 34.4 g/dL (ref 30.0–36.0)
MCV: 84.2 fL (ref 80.0–100.0)
Platelets: 187 K/uL (ref 150–400)
RBC: 4.04 MIL/uL — ABNORMAL LOW (ref 4.22–5.81)
RDW: 14.4 % (ref 11.5–15.5)
WBC: 29 K/uL — ABNORMAL HIGH (ref 4.0–10.5)
nRBC: 0 % (ref 0.0–0.2)

## 2024-06-12 LAB — HEPATITIS B DNA, ULTRAQUANTITATIVE, PCR
HBV DNA SERPL PCR-ACNC: NOT DETECTED [IU]/mL
HBV DNA SERPL PCR-LOG IU: UNDETERMINED {Log_IU}/mL

## 2024-06-12 LAB — HCV RNA QUANT
HCV Quantitative Log: 6.334 {Log_IU}/mL
HCV Quantitative: 2160000 [IU]/mL

## 2024-06-12 LAB — MAGNESIUM: Magnesium: 2.3 mg/dL (ref 1.7–2.4)

## 2024-06-12 LAB — PHOSPHORUS: Phosphorus: 3.5 mg/dL (ref 2.5–4.6)

## 2024-06-12 MED ORDER — DEXTROSE-SODIUM CHLORIDE 5-0.45 % IV SOLN: INTRAVENOUS | Status: AC

## 2024-06-12 NOTE — Plan of Care (Signed)

## 2024-06-12 NOTE — Plan of Care (Signed)
" °  Problem: Education: Goal: Knowledge of disease or condition will improve Outcome: Progressing   Problem: Ischemic Stroke/TIA Tissue Perfusion: Goal: Complications of ischemic stroke/TIA will be minimized Outcome: Progressing   Problem: Health Behavior/Discharge Planning: Goal: Goals will be collaboratively established with patient/family Outcome: Progressing   Problem: Self-Care: Goal: Ability to participate in self-care as condition permits will improve Outcome: Progressing   Problem: Clinical Measurements: Goal: Will remain free from infection Outcome: Progressing   "

## 2024-06-12 NOTE — Progress Notes (Signed)
 " PROGRESS NOTE  Patrick [name] FMW:[id number] DOB: 10-Feb-1981 DOA: 06/07/2024 PCP: Patient, No Pcp Per   LOS: 4 days   Brief Narrative / Interim history: 44 year old male with history of polysubstance abuse/IVDU who comes into the hospital with left facial droop, left upper extremity weakness and confusion.  He was found to have acute/subacute stroke on MRI in the setting of MRSA bacteremia and possible aortic valve vegetation on 2D echo.  Neurology, ID consulted  Subjective / 24h Interval events: He remains persistently febrile throughout the night however overall fever curve has been improving.  He this morning he appears less agitated, less tremulous, following commands more appropriately.  He is trying to answer questions and mumbling his name but remains somewhat unintelligible.  Assesement and Plan: Principal problem Severe sepsis due to MRSA bacteremia -patient found to have MRSA bacteremia, with high suspicion for IE based on a 2D echo.  Brain imaging also showed to have multiple CVAs likely due to septic emboli.  CT scan of the chest also showed multifocal pneumonia - Patient remains with ongoing sepsis physiology with persistent fevers, tachycardia, confusion, although fever curve is improving - ID following, continue antibiotics with vancomycin .  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  Imaging on admission with MRI showed acute/subacute infarction in the right frontoparietal junction/insular region on the right MCA territory with mild hemorrhagic transformation.  It also showed acute infarct in the left cerebellar hemisphere and small acute lacunar infarcts in the left temporal lobe.  CT angiogram without LVO.  A1c 6.0, LDL 5.  Underwent a 2D echocardiogram which was unable to rule out oscillating density on the aortic valve.  TEE is pending, presumed on Monday.  His agitation has improved significantly today, anticipate he will be okay for  TEE tomorrow - Continues to have left hemiparesis and dysarthria.  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  Unclear that he was ever diagnosed or treated for syphilis in the past, therefore treating as for neurosyphilis with IV penicillin .  Continue penicillin   Hep B, C positivity -per ID  Prediabetes-A1c 6.0.  Monitor  IVDU -he was slightly more agitated yesterday, but today he is improved  Elevated blood pressure -not taking any medications, no history of HTN.  May need initiation if remains persistent but now hold off in the setting of ongoing sepsis physiology  Dysphagia -SLP consulted, thankfully he passed evaluation and he is now on a dysphagia 1 diet.  Encourage p.o. intake  Scheduled Meds:  sodium chloride  flush  3 mL Intravenous Once   Continuous Infusions:  penicillin  G potassium 24 Million Units in dextrose  5 % 500 mL CONTINUOUS infusion 24 Million Units (06/11/24 1414)   vancomycin  1,750 mg (06/12/24 0806)   PRN Meds:.acetaminophen  **OR** acetaminophen , buprenorphine -naloxone , haloperidol  lactate, LORazepam , naLOXone  (NARCAN )  injection  Current Outpatient Medications  Medication Instructions   acetaminophen  (TYLENOL ) 1,000 mg, Oral, Every 6 hours PRN   albuterol  (PROVENTIL  HFA;VENTOLIN  HFA) 108 (90 Base) MCG/ACT inhaler 2 puffs, Inhalation, Every 6 hours PRN   amoxicillin-clavulanate (AUGMENTIN) 875-125 MG tablet 1 tablet, 2 times daily    Diet Orders (From admission, onward)     Start     Ordered   06/13/24 0001  Diet NPO time specified  Diet effective midnight        06/10/24 1501   06/10/24 1310  DIET - DYS 1 Fluid consistency: Thin  Diet effective  now       Question:  Fluid consistency:  Answer:  Thin   06/10/24 1309           DVT prophylaxis: SCDs Start: 06/08/24 0540  Lab Results  Component Value Date   PLT 187 06/12/2024     Code Status: Full Code  Family  Communication: Twin brother present at bedside, spoke with daughter Judeth [phone] on Thursday  Status is: Inpatient Remains inpatient appropriate because: Severity of illness   Level of care: Progressive  Consultants:  Neurology ID  Objective: Vitals:   06/12/24 0727 06/12/24 0820 06/12/24 0920 06/12/24 1020  BP:  137/78 134/83 (!) 140/80  Pulse: (!) 111 (!) 107 (!) 112 (!) 117  Resp: (!) 42 (!) 39 (!) 45 (!) 35  Temp: 100 F (37.8 C)     TempSrc: Axillary     SpO2: 95% 94% 95% 94%  Weight:      Height:        Intake/Output Summary (Last 24 hours) at 06/12/2024 1114 Last data filed at 06/11/2024 1500 Gross per 24 hour  Intake 0 ml  Output 1100 ml  Net -1100 ml   Wt Readings from Last 3 Encounters:  06/10/24 71.7 kg    Examination:  Constitutional: NAD, confused, mumbling Eyes: lids and conjunctivae normal, no scleral icterus ENMT: Dry mucous membranes Neck: normal, supple Respiratory: Diffuse rhonchi, tachypneic Cardiovascular: Regular rate and rhythm, no murmurs / rubs / gallops.  Tachycardic.  No LE edema. Abdomen: soft, no distention, no tenderness. Bowel sounds positive.  Skin: no rashes  Data Reviewed: I have independently reviewed following labs and imaging studies   CBC Recent Labs  Lab 06/07/24 2331 06/07/24 2334 06/07/24 2335 06/10/24 0148 06/11/24 0543 06/12/24 0616  WBC 20.1*  --   --  23.1* 25.0* 29.0*  HGB 15.1 15.6 16.3 13.1 12.0* 11.7*  HCT 43.4 46.0 48.0 36.9* 33.5* 34.0*  PLT 108*  --   --  145* 180 187  MCV 84.4  --   --  82.2 81.9 84.2  MCH 29.4  --   --  29.2 29.3 29.0  MCHC 34.8  --   --  35.5 35.8 34.4  RDW 13.7  --   --  13.9 13.9 14.4  LYMPHSABS 1.0  --   --   --   --   --   MONOABS 1.7*  --   --   --   --   --   EOSABS 0.0  --   --   --   --   --   BASOSABS 0.1  --   --   --   --   --     Recent Labs  Lab 06/07/24 2331 06/07/24 2334 06/07/24 2335 06/08/24 0123 06/08/24 0337 06/08/24 0513 06/08/24 0828  06/08/24 0903 06/10/24 0148 06/11/24 0543 06/12/24 0616  NA 128* 129* 130*  --   --   --   --   --  135 138 136  K 4.5 4.1 4.1  --   --   --   --   --  3.5 3.5 3.9  CL 91*  --  94*  --   --   --   --   --  101 104 107  CO2 25  --   --   --   --   --   --   --  24 23 19*  GLUCOSE 143*  --  139*  --   --   --   --   --  131* 159* 139*  BUN 36*  --  37*  --   --   --   --   --  23* 21* 26*  CREATININE 1.00  --  1.10  --   --   --   --   --  0.72 0.84 0.80  CALCIUM 9.0  --   --   --   --   --   --   --  8.2* 7.7* 7.8*  AST 144*  --   --   --   --   --   --   --  101* 66* 58*  ALT 71*  --   --   --   --   --   --   --  68* 61* 44  ALKPHOS 181*  --   --   --   --   --   --   --  208* 172* 148*  BILITOT 0.9  --   --   --   --   --   --   --  1.7* 1.6* 1.4*  ALBUMIN 3.3*  --   --   --   --   --   --   --  2.7* 2.8* 2.7*  MG  --   --   --   --   --   --   --   --  2.0 2.1 2.3  PROCALCITON  --   --   --   --   --  3.05  --   --   --   --   --   LATICACIDVEN  --   --   --  2.5* 2.6*  --  1.5  --   --   --   --   INR 1.3*  --   --   --   --   --   --   --   --   --   --   HGBA1C  --   --   --   --   --   --   --  6.0*  --   --   --   AMMONIA 28  --   --   --   --   --   --   --   --   --   --     ------------------------------------------------------------------------------------------------------------------ No results for input(s): CHOL, HDL, LDLCALC, TRIG, CHOLHDL, LDLDIRECT in the last 72 hours.   Lab Results  Component Value Date   HGBA1C 6.0 (H) 06/08/2024   ------------------------------------------------------------------------------------------------------------------ No results for input(s): TSH, T4TOTAL, T3FREE, THYROIDAB in the last 72 hours.  Invalid input(s): FREET3  Cardiac Enzymes No results for input(s): CKMB, TROPONINI, MYOGLOBIN in the last 168 hours.  Invalid input(s):  CK ------------------------------------------------------------------------------------------------------------------ No results found for: BNP  CBG: Recent Labs  Lab 06/08/24 0006 06/08/24 2201  GLUCAP 161* 123*    Recent Results (from the past 240 hours)  Blood culture (routine x 2)     Status: Abnormal (Preliminary result)   Collection Time: 06/08/24 12:35 AM   Specimen: BLOOD RIGHT ARM  Result Value Ref Range Status   Specimen Description BLOOD RIGHT ARM  Final   Special Requests   Final    BOTTLES DRAWN AEROBIC AND ANAEROBIC Blood Culture adequate volume   Culture  Setup Time   Final    GRAM POSITIVE COCCI IN CLUSTERS IN BOTH AEROBIC AND ANAEROBIC BOTTLES CRITICAL RESULT CALLED TO, READ BACK BY AND  VERIFIED WITH: PHARMD J.FRENS AT 1213 ON 06/08/2024 BY T.SAAD.    Culture (A)  Final    METHICILLIN RESISTANT STAPHYLOCOCCUS AUREUS Sent to [facility] for further susceptibility testing. Performed at [facility], [address] [place], [place] [address]    Report Status PENDING  Incomplete   Organism ID, Bacteria METHICILLIN RESISTANT STAPHYLOCOCCUS AUREUS  Final      Susceptibility   Methicillin resistant staphylococcus aureus - MIC*    CIPROFLOXACIN >=8 RESISTANT Resistant     ERYTHROMYCIN >=8 RESISTANT Resistant     GENTAMICIN <=0.5 SENSITIVE Sensitive     OXACILLIN >=4 RESISTANT Resistant     TETRACYCLINE <=1 SENSITIVE Sensitive     VANCOMYCIN  1 SENSITIVE Sensitive     TRIMETH/SULFA >=320 RESISTANT Resistant     CLINDAMYCIN >=8 RESISTANT Resistant     RIFAMPIN <=0.5 SENSITIVE Sensitive     Inducible Clindamycin NEGATIVE Sensitive     LINEZOLID 2 SENSITIVE Sensitive     * METHICILLIN RESISTANT STAPHYLOCOCCUS AUREUS  Blood Culture ID Panel (Reflexed)     Status: Abnormal   Collection Time: 06/08/24 12:35 AM  Result Value Ref Range Status   Enterococcus faecalis NOT DETECTED NOT DETECTED Final   Enterococcus Faecium NOT DETECTED NOT DETECTED Final    Listeria monocytogenes NOT DETECTED NOT DETECTED Final   Staphylococcus species DETECTED (A) NOT DETECTED Final    Comment: CRITICAL RESULT CALLED TO, READ BACK BY AND VERIFIED WITH: PHARMD J.FRENS AT 1213 ON 06/08/2024 BY T.SAAD.    Staphylococcus aureus (BCID) DETECTED (A) NOT DETECTED Final    Comment: Methicillin (oxacillin)-resistant Staphylococcus aureus (MRSA). MRSA is predictably resistant to beta-lactam antibiotics (except ceftaroline). Preferred therapy is vancomycin  unless clinically contraindicated. Patient requires contact precautions if  hospitalized. CRITICAL RESULT CALLED TO, READ BACK BY AND VERIFIED WITH: PHARMD J.FRENS AT 1213 ON 06/08/2024 BY T.SAAD.    Staphylococcus epidermidis NOT DETECTED NOT DETECTED Final   Staphylococcus lugdunensis NOT DETECTED NOT DETECTED Final   Streptococcus species NOT DETECTED NOT DETECTED Final   Streptococcus agalactiae NOT DETECTED NOT DETECTED Final   Streptococcus pneumoniae NOT DETECTED NOT DETECTED Final   Streptococcus pyogenes NOT DETECTED NOT DETECTED Final   A.calcoaceticus-baumannii NOT DETECTED NOT DETECTED Final   Bacteroides fragilis NOT DETECTED NOT DETECTED Final   Enterobacterales NOT DETECTED NOT DETECTED Final   Enterobacter cloacae complex NOT DETECTED NOT DETECTED Final   Escherichia coli NOT DETECTED NOT DETECTED Final   Klebsiella aerogenes NOT DETECTED NOT DETECTED Final   Klebsiella oxytoca NOT DETECTED NOT DETECTED Final   Klebsiella pneumoniae NOT DETECTED NOT DETECTED Final   Proteus species NOT DETECTED NOT DETECTED Final   Salmonella species NOT DETECTED NOT DETECTED Final   Serratia marcescens NOT DETECTED NOT DETECTED Final   Haemophilus influenzae NOT DETECTED NOT DETECTED Final   Neisseria meningitidis NOT DETECTED NOT DETECTED Final   Pseudomonas aeruginosa NOT DETECTED NOT DETECTED Final   Stenotrophomonas maltophilia NOT DETECTED NOT DETECTED Final   Candida albicans NOT DETECTED NOT DETECTED  Final   Candida auris NOT DETECTED NOT DETECTED Final   Candida glabrata NOT DETECTED NOT DETECTED Final   Candida krusei NOT DETECTED NOT DETECTED Final   Candida parapsilosis NOT DETECTED NOT DETECTED Final   Candida tropicalis NOT DETECTED NOT DETECTED Final   Cryptococcus neoformans/gattii NOT DETECTED NOT DETECTED Final   Meth resistant mecA/C and MREJ DETECTED (A) NOT DETECTED Final    Comment: CRITICAL RESULT CALLED TO, READ BACK BY AND VERIFIED WITH: PHARMD J.FRENS AT  1213 ON 06/08/2024 BY T.SAAD. Performed at [facility], [address] [place], [place] [address]   MIC (1 Drug)-     Status: Abnormal (Preliminary result)   Collection Time: 06/08/24 12:35 AM  Result Value Ref Range Status   Min Inhibitory Conc (1 Drug) Preliminary report (A)  Final    Comment: (NOTE) Performed At: [facility] [address] [place], [place] [id number] Jennette Shorter MD Ey:[id number]    Source [phone] MRSA BLOOD DAPTOMYCIN  Final    Comment: Performed at [facility], [address], [place], [place] [address]  MIC Result     Status: Abnormal   Collection Time: 06/08/24 12:35 AM  Result Value Ref Range Status   Result 1 (MIC) Comment (A)  Final    Comment: (NOTE) Methicillin - resistant Staphylococcus aureus Identification performed by account, not confirmed by this laboratory. DAPTOMYCIN Performed At: [facility] [address] [place], [place] [id number] Jennette Shorter MD Ey:[id number]   Blood culture (routine x 2)     Status: Abnormal   Collection Time: 06/08/24 12:40 AM   Specimen: BLOOD LEFT ARM  Result Value Ref Range Status   Specimen Description BLOOD LEFT ARM  Final   Special Requests   Final    BOTTLES DRAWN AEROBIC AND ANAEROBIC Blood Culture adequate volume   Culture  Setup Time   Final    GRAM POSITIVE COCCI IN CLUSTERS IN BOTH AEROBIC AND ANAEROBIC BOTTLES CRITICAL VALUE NOTED.  VALUE IS CONSISTENT WITH PREVIOUSLY REPORTED AND CALLED  VALUE.    Culture (A)  Final    STAPHYLOCOCCUS AUREUS SUSCEPTIBILITIES PERFORMED ON PREVIOUS CULTURE WITHIN THE LAST 5 DAYS. Performed at [facility], [address], [place], [place] [address]    Report Status 06/10/2024 FINAL  Final  Resp panel by RT-PCR (RSV, Flu A&B, Covid) Anterior Nasal Swab     Status: None   Collection Time: 06/08/24  3:54 AM   Specimen: Anterior Nasal Swab  Result Value Ref Range Status   SARS Coronavirus 2 by RT PCR NEGATIVE NEGATIVE Final   Influenza A by PCR NEGATIVE NEGATIVE Final   Influenza B by PCR NEGATIVE NEGATIVE Final    Comment: (NOTE) The Xpert Xpress SARS-CoV-2/FLU/RSV plus assay is intended as an aid in the diagnosis of influenza from Nasopharyngeal swab specimens and should not be used as a sole basis for treatment. Nasal washings and aspirates are unacceptable for Xpert Xpress SARS-CoV-2/FLU/RSV testing.  Fact Sheet for Patients: bloggercourse.com  Fact Sheet for Healthcare Providers: seriousbroker.it  This test is not yet approved or cleared by the [place]  FDA and has been authorized for detection and/or diagnosis of SARS-CoV-2 by FDA under an Emergency Use Authorization (EUA). This EUA will remain in effect (meaning this test can be used) for the duration of the COVID-19 declaration under Section 564(b)(1) of the Act, 21 U.S.C. section 360bbb-3(b)(1), unless the authorization is terminated or revoked.     Resp Syncytial Virus by PCR NEGATIVE NEGATIVE Final    Comment: (NOTE) Fact Sheet for Patients: bloggercourse.com  Fact Sheet for Healthcare Providers: seriousbroker.it  This test is not yet approved or cleared by the [place]  FDA and has been authorized for detection and/or diagnosis of SARS-CoV-2 by FDA under an Emergency Use Authorization (EUA). This EUA will remain in effect (meaning this test can be used)  for the duration of the COVID-19 declaration under Section 564(b)(1) of the Act, 21 U.S.C. section 360bbb-3(b)(1), unless the authorization is  terminated or revoked.  Performed at [facility], [address], [place], [place] [address]   Culture, blood (Routine X 2) w Reflex to ID Panel     Status: None (Preliminary result)   Collection Time: 06/09/24  5:36 AM   Specimen: BLOOD  Result Value Ref Range Status   Specimen Description BLOOD SITE NOT SPECIFIED  Final   Special Requests   Final    BOTTLES DRAWN AEROBIC AND ANAEROBIC Blood Culture results may not be optimal due to an inadequate volume of blood received in culture bottles   Culture   Final    NO GROWTH 3 DAYS Performed at [facility], [address] [place], [place] [address]    Report Status PENDING  Incomplete  Culture, blood (Routine X 2) w Reflex to ID Panel     Status: None (Preliminary result)   Collection Time: 06/09/24  5:36 AM   Specimen: BLOOD  Result Value Ref Range Status   Specimen Description BLOOD SITE NOT SPECIFIED  Final   Special Requests   Final    BOTTLES DRAWN AEROBIC AND ANAEROBIC Blood Culture adequate volume   Culture   Final    NO GROWTH 3 DAYS Performed at [facility], [address], [place], [place] [address]    Report Status PENDING  Incomplete     Radiology Studies: No results found.     Nilda Fendt, MD, PhD [facility]  Between 7 am - 7 pm I am available, please contact me via Amion (for emergencies) or Securechat (non urgent messages)  Between 7 pm - 7 am I am not available, please contact night coverage MD/APP via Amion  "

## 2024-06-13 ENCOUNTER — Inpatient Hospital Stay (HOSPITAL_COMMUNITY)

## 2024-06-13 ENCOUNTER — Encounter (HOSPITAL_COMMUNITY): Payer: Self-pay | Admitting: Internal Medicine

## 2024-06-13 DIAGNOSIS — R6521 Severe sepsis with septic shock: Secondary | ICD-10-CM | POA: Diagnosis not present

## 2024-06-13 DIAGNOSIS — A419 Sepsis, unspecified organism: Secondary | ICD-10-CM | POA: Insufficient documentation

## 2024-06-13 DIAGNOSIS — I63511 Cerebral infarction due to unspecified occlusion or stenosis of right middle cerebral artery: Secondary | ICD-10-CM | POA: Insufficient documentation

## 2024-06-13 DIAGNOSIS — R7989 Other specified abnormal findings of blood chemistry: Secondary | ICD-10-CM | POA: Diagnosis not present

## 2024-06-13 DIAGNOSIS — R7881 Bacteremia: Secondary | ICD-10-CM | POA: Diagnosis not present

## 2024-06-13 DIAGNOSIS — J9601 Acute respiratory failure with hypoxia: Secondary | ICD-10-CM | POA: Diagnosis not present

## 2024-06-13 DIAGNOSIS — J189 Pneumonia, unspecified organism: Secondary | ICD-10-CM

## 2024-06-13 DIAGNOSIS — T40601A Poisoning by unspecified narcotics, accidental (unintentional), initial encounter: Secondary | ICD-10-CM

## 2024-06-13 DIAGNOSIS — T40604A Poisoning by unspecified narcotics, undetermined, initial encounter: Secondary | ICD-10-CM | POA: Diagnosis not present

## 2024-06-13 DIAGNOSIS — I639 Cerebral infarction, unspecified: Secondary | ICD-10-CM | POA: Diagnosis not present

## 2024-06-13 DIAGNOSIS — A4102 Sepsis due to Methicillin resistant Staphylococcus aureus: Secondary | ICD-10-CM | POA: Diagnosis not present

## 2024-06-13 DIAGNOSIS — B9562 Methicillin resistant Staphylococcus aureus infection as the cause of diseases classified elsewhere: Secondary | ICD-10-CM | POA: Diagnosis not present

## 2024-06-13 DIAGNOSIS — A523 Neurosyphilis, unspecified: Secondary | ICD-10-CM | POA: Diagnosis not present

## 2024-06-13 DIAGNOSIS — I38 Endocarditis, valve unspecified: Secondary | ICD-10-CM

## 2024-06-13 DIAGNOSIS — G9340 Encephalopathy, unspecified: Secondary | ICD-10-CM | POA: Diagnosis not present

## 2024-06-13 DIAGNOSIS — I330 Acute and subacute infective endocarditis: Secondary | ICD-10-CM | POA: Diagnosis not present

## 2024-06-13 DIAGNOSIS — I6389 Other cerebral infarction: Secondary | ICD-10-CM | POA: Diagnosis not present

## 2024-06-13 LAB — HEPATITIS C GENOTYPE

## 2024-06-13 LAB — COMPREHENSIVE METABOLIC PANEL WITH GFR
ALT: 52 U/L — ABNORMAL HIGH (ref 0–44)
AST: 68 U/L — ABNORMAL HIGH (ref 15–41)
Albumin: 2.7 g/dL — ABNORMAL LOW (ref 3.5–5.0)
Alkaline Phosphatase: 118 U/L (ref 38–126)
Anion gap: 13 (ref 5–15)
BUN: 29 mg/dL — ABNORMAL HIGH (ref 6–20)
CO2: 16 mmol/L — ABNORMAL LOW (ref 22–32)
Calcium: 7.8 mg/dL — ABNORMAL LOW (ref 8.9–10.3)
Chloride: 108 mmol/L (ref 98–111)
Creatinine, Ser: 0.74 mg/dL (ref 0.61–1.24)
GFR, Estimated: >60 mL/min
Glucose, Bld: 127 mg/dL — ABNORMAL HIGH (ref 70–99)
Potassium: 4 mmol/L (ref 3.5–5.1)
Sodium: 137 mmol/L (ref 135–145)
Total Bilirubin: 1.1 mg/dL (ref 0.0–1.2)
Total Protein: 7.8 g/dL (ref 6.5–8.1)

## 2024-06-13 LAB — CBC
HCT: 34.4 % — ABNORMAL LOW (ref 39.0–52.0)
Hemoglobin: 11.4 g/dL — ABNORMAL LOW (ref 13.0–17.0)
MCH: 29.3 pg (ref 26.0–34.0)
MCHC: 33.1 g/dL (ref 30.0–36.0)
MCV: 88.4 fL (ref 80.0–100.0)
Platelets: 137 K/uL — ABNORMAL LOW (ref 150–400)
RBC: 3.89 MIL/uL — ABNORMAL LOW (ref 4.22–5.81)
RDW: 14.7 % (ref 11.5–15.5)
WBC: 26.4 K/uL — ABNORMAL HIGH (ref 4.0–10.5)
nRBC: 0 % (ref 0.0–0.2)

## 2024-06-13 LAB — POCT I-STAT 7, (LYTES, BLD GAS, ICA,H+H)
Acid-base deficit: 7 mmol/L — ABNORMAL HIGH (ref 0.0–2.0)
Acid-base deficit: 7 mmol/L — ABNORMAL HIGH (ref 0.0–2.0)
Bicarbonate: 19.1 mmol/L — ABNORMAL LOW (ref 20.0–28.0)
Bicarbonate: 16.3 mmol/L — ABNORMAL LOW (ref 20.0–28.0)
Calcium, Ion: 1.17 mmol/L (ref 1.15–1.40)
Calcium, Ion: 1.13 mmol/L — ABNORMAL LOW (ref 1.15–1.40)
HCT: 36 % — ABNORMAL LOW (ref 39.0–52.0)
HCT: 33 % — ABNORMAL LOW (ref 39.0–52.0)
Hemoglobin: 12.2 g/dL — ABNORMAL LOW (ref 13.0–17.0)
Hemoglobin: 11.2 g/dL — ABNORMAL LOW (ref 13.0–17.0)
O2 Saturation: 99 %
O2 Saturation: 92 %
Patient temperature: 99.7
Potassium: 4.7 mmol/L (ref 3.5–5.1)
Potassium: 4.4 mmol/L (ref 3.5–5.1)
Sodium: 143 mmol/L (ref 135–145)
Sodium: 138 mmol/L (ref 135–145)
TCO2: 20 mmol/L — ABNORMAL LOW (ref 22–32)
TCO2: 17 mmol/L — ABNORMAL LOW (ref 22–32)
pCO2 arterial: 40 mmHg (ref 32–48)
pCO2 arterial: 24.3 mmHg — ABNORMAL LOW (ref 32–48)
pH, Arterial: 7.289 — ABNORMAL LOW (ref 7.35–7.45)
pH, Arterial: 7.433 (ref 7.35–7.45)
pO2, Arterial: 135 mmHg — ABNORMAL HIGH (ref 83–108)
pO2, Arterial: 60 mmHg — ABNORMAL LOW (ref 83–108)

## 2024-06-13 LAB — ECHOCARDIOGRAM LIMITED
Height: 71 in
Weight: 2529.12 [oz_av]

## 2024-06-13 LAB — MIC RESULT

## 2024-06-13 LAB — VANCOMYCIN, TROUGH: Vancomycin Tr: 10 ug/mL — ABNORMAL LOW (ref 15–20)

## 2024-06-13 LAB — MINIMUM INHIBITORY CONC. (1 DRUG)

## 2024-06-13 LAB — GLUCOSE, CAPILLARY
Glucose-Capillary: 109 mg/dL — ABNORMAL HIGH (ref 70–99)
Glucose-Capillary: 135 mg/dL — ABNORMAL HIGH (ref 70–99)
Glucose-Capillary: 149 mg/dL — ABNORMAL HIGH (ref 70–99)
Glucose-Capillary: 149 mg/dL — ABNORMAL HIGH (ref 70–99)

## 2024-06-13 LAB — PHOSPHORUS: Phosphorus: 3.6 mg/dL (ref 2.5–4.6)

## 2024-06-13 LAB — MRSA NEXT GEN BY PCR, NASAL: MRSA by PCR Next Gen: DETECTED — AB

## 2024-06-13 LAB — MAGNESIUM: Magnesium: 2.3 mg/dL (ref 1.7–2.4)

## 2024-06-13 LAB — VANCOMYCIN, PEAK: Vancomycin Pk: 42 ug/mL — ABNORMAL HIGH (ref 30–40)

## 2024-06-13 MED ORDER — IPRATROPIUM-ALBUTEROL 0.5-2.5 (3) MG/3ML IN SOLN: 3.0000 mL | Freq: Four times a day (QID) | RESPIRATORY_TRACT | Status: DC

## 2024-06-13 MED ORDER — ETOMIDATE 2 MG/ML IV SOLN
INTRAVENOUS | Status: AC
  Filled 2024-06-13: qty 10

## 2024-06-13 MED ORDER — VANCOMYCIN HCL 750 MG/150ML IV SOLN
750.0000 mg | Freq: Three times a day (TID) | INTRAVENOUS | Status: DC
  Filled 2024-06-13: qty 150

## 2024-06-13 MED ORDER — PROPOFOL 1000 MG/100ML IV EMUL
INTRAVENOUS | Status: AC
  Administered 2024-06-13: 20 ug/kg/min via INTRAVENOUS
  Filled 2024-06-13: qty 100

## 2024-06-13 MED ORDER — HYDROMORPHONE HCL-NACL 50-0.9 MG/50ML-% IV SOLN
0.5000 mg/h | INTRAVENOUS | Status: DC
  Administered 2024-06-13: 2 mg/h via INTRAVENOUS
  Administered 2024-06-13: 3.5 mg/h via INTRAVENOUS
  Administered 2024-06-14 – 2024-06-15 (×2): 2 mg/h via INTRAVENOUS
  Administered 2024-06-15: 4 mg/h via INTRAVENOUS
  Administered 2024-06-16 – 2024-06-18 (×3): 3.5 mg/h via INTRAVENOUS
  Filled 2024-06-13 (×10): qty 50

## 2024-06-13 MED ORDER — ETOMIDATE 2 MG/ML IV SOLN
INTRAVENOUS | Status: AC
  Administered 2024-06-13: 20 mg
  Filled 2024-06-13: qty 20

## 2024-06-13 MED ORDER — FENTANYL CITRATE (PF) 50 MCG/ML IJ SOSY
PREFILLED_SYRINGE | INTRAMUSCULAR | Status: AC
  Filled 2024-06-13: qty 2

## 2024-06-13 MED ORDER — VANCOMYCIN HCL IN DEXTROSE 1-5 GM/200ML-% IV SOLN
1000.0000 mg | Freq: Three times a day (TID) | INTRAVENOUS | Status: DC
  Administered 2024-06-13 – 2024-06-14 (×2): 1000 mg via INTRAVENOUS
  Filled 2024-06-13 (×3): qty 200

## 2024-06-13 MED ORDER — VASOPRESSIN 20 UNITS/100 ML INFUSION FOR SHOCK
0.0000 [IU]/min | INTRAVENOUS | Status: DC
  Administered 2024-06-13 – 2024-06-18 (×12): 0.03 [IU]/min via INTRAVENOUS
  Filled 2024-06-13 (×12): qty 100

## 2024-06-13 MED ORDER — DEXMEDETOMIDINE HCL IN NACL 400 MCG/100ML IV SOLN
0.0000 ug/kg/h | INTRAVENOUS | Status: DC
  Administered 2024-06-13: 0.4 ug/kg/h via INTRAVENOUS

## 2024-06-13 MED ORDER — FENTANYL CITRATE (PF) 50 MCG/ML IJ SOSY: 50.0000 ug | PREFILLED_SYRINGE | Freq: Once | INTRAMUSCULAR | Status: DC

## 2024-06-13 MED ORDER — ROCURONIUM BROMIDE 10 MG/ML (PF) SYRINGE
PREFILLED_SYRINGE | INTRAVENOUS | Status: AC
  Filled 2024-06-13: qty 10

## 2024-06-13 MED ORDER — SODIUM CHLORIDE 0.9 % IV SOLN: INTRAVENOUS | Status: AC

## 2024-06-13 MED ORDER — MIDAZOLAM HCL 2 MG/2ML IJ SOLN
INTRAMUSCULAR | Status: AC
  Filled 2024-06-13: qty 2

## 2024-06-13 MED ORDER — MIDAZOLAM HCL (PF) 2 MG/2ML IJ SOLN
2.0000 mg | Freq: Once | INTRAMUSCULAR | Status: AC
  Administered 2024-06-13: 2 mg via INTRAVENOUS

## 2024-06-13 MED ORDER — MORPHINE SULFATE (PF) 2 MG/ML IV SOLN: 1.0000 mg | Freq: Once | INTRAVENOUS | Status: DC

## 2024-06-13 MED ORDER — MIDAZOLAM HCL 2 MG/2ML IJ SOLN
INTRAMUSCULAR | Status: AC
  Administered 2024-06-13: 2 mg
  Filled 2024-06-13: qty 2

## 2024-06-13 MED ORDER — ROCURONIUM BROMIDE 10 MG/ML (PF) SYRINGE
80.0000 mg | PREFILLED_SYRINGE | Freq: Once | INTRAVENOUS | Status: AC
  Administered 2024-06-13: 80 mg via INTRAVENOUS

## 2024-06-13 MED ORDER — LEVOFLOXACIN IN D5W 750 MG/150ML IV SOLN
750.0000 mg | INTRAVENOUS | Status: DC
  Administered 2024-06-13 – 2024-06-14 (×2): 750 mg via INTRAVENOUS
  Filled 2024-06-13 (×3): qty 150

## 2024-06-13 MED ORDER — FENTANYL CITRATE (PF) 50 MCG/ML IJ SOSY
100.0000 ug | PREFILLED_SYRINGE | Freq: Once | INTRAMUSCULAR | Status: AC
  Administered 2024-06-13: 100 ug via INTRAVENOUS
  Filled 2024-06-13: qty 2

## 2024-06-13 MED ORDER — HYDROMORPHONE HCL 1 MG/ML IJ SOLN
1.0000 mg | Freq: Once | INTRAMUSCULAR | Status: AC
  Administered 2024-06-13: 1 mg via INTRAVENOUS
  Filled 2024-06-13: qty 1

## 2024-06-13 MED ORDER — HYDROMORPHONE BOLUS VIA INFUSION
0.2500 mg | INTRAVENOUS | Status: DC | PRN
  Administered 2024-06-13 (×2): 1.5 mg via INTRAVENOUS
  Administered 2024-06-14: 2 mg via INTRAVENOUS
  Administered 2024-06-14 (×2): 1.75 mg via INTRAVENOUS
  Administered 2024-06-15 (×2): 2 mg via INTRAVENOUS
  Administered 2024-06-15 (×2): 1 mg via INTRAVENOUS
  Administered 2024-06-15 – 2024-06-16 (×2): 1.25 mg via INTRAVENOUS
  Administered 2024-06-16 (×2): 2 mg via INTRAVENOUS
  Administered 2024-06-17: 1 mg via INTRAVENOUS

## 2024-06-13 MED ORDER — DEXMEDETOMIDINE HCL IN NACL 400 MCG/100ML IV SOLN
INTRAVENOUS | Status: AC
  Filled 2024-06-13: qty 100

## 2024-06-13 MED ORDER — POLYETHYLENE GLYCOL 3350 17 G PO PACK
17.0000 g | PACK | Freq: Every day | ORAL | Status: DC
  Administered 2024-06-13 – 2024-06-17 (×3): 17 g
  Filled 2024-06-13 (×4): qty 1

## 2024-06-13 MED ORDER — NOREPINEPHRINE 4 MG/250ML-% IV SOLN
INTRAVENOUS | Status: AC
  Administered 2024-06-13: 10 ug/min via INTRAVENOUS
  Filled 2024-06-13: qty 250

## 2024-06-13 MED ORDER — FENTANYL CITRATE (PF) 50 MCG/ML IJ SOSY
PREFILLED_SYRINGE | INTRAMUSCULAR | Status: AC
  Administered 2024-06-13: 100 ug
  Filled 2024-06-13: qty 2

## 2024-06-13 MED ORDER — DOCUSATE SODIUM 50 MG/5ML PO LIQD
100.0000 mg | Freq: Two times a day (BID) | ORAL | Status: DC
  Administered 2024-06-13 – 2024-06-15 (×4): 100 mg
  Filled 2024-06-13 (×6): qty 10

## 2024-06-13 MED ORDER — DEXMEDETOMIDINE HCL IN NACL 400 MCG/100ML IV SOLN: 0.0000 ug/kg/h | INTRAVENOUS | Status: DC

## 2024-06-13 MED ORDER — ETOMIDATE 2 MG/ML IV SOLN: 20.0000 mg | Freq: Once | INTRAVENOUS | Status: DC

## 2024-06-13 MED ORDER — MIDAZOLAM HCL (PF) 2 MG/2ML IJ SOLN
1.0000 mg | INTRAMUSCULAR | Status: DC | PRN
  Administered 2024-06-16 (×3): 2 mg via INTRAVENOUS
  Administered 2024-06-17: 1 mg via INTRAVENOUS
  Administered 2024-06-17 – 2024-06-18 (×2): 2 mg via INTRAVENOUS
  Filled 2024-06-13 (×4): qty 2

## 2024-06-13 MED ORDER — PROPOFOL 1000 MG/100ML IV EMUL
0.0000 ug/kg/min | INTRAVENOUS | Status: DC
  Administered 2024-06-13: 20 ug/kg/min via INTRAVENOUS
  Administered 2024-06-14: 50 ug/kg/min via INTRAVENOUS
  Administered 2024-06-14 – 2024-06-15 (×3): 20 ug/kg/min via INTRAVENOUS
  Administered 2024-06-15 (×2): 50 ug/kg/min via INTRAVENOUS
  Administered 2024-06-15: 10 ug/kg/min via INTRAVENOUS
  Administered 2024-06-16: 40 ug/kg/min via INTRAVENOUS
  Administered 2024-06-16 – 2024-06-17 (×6): 50 ug/kg/min via INTRAVENOUS
  Administered 2024-06-17: 45 ug/kg/min via INTRAVENOUS
  Administered 2024-06-17 – 2024-06-18 (×4): 50 ug/kg/min via INTRAVENOUS
  Filled 2024-06-13 (×20): qty 100

## 2024-06-13 MED ORDER — NOREPINEPHRINE 4 MG/250ML-% IV SOLN
0.0000 ug/min | INTRAVENOUS | Status: DC
  Administered 2024-06-13 (×2): 20 ug/min via INTRAVENOUS
  Administered 2024-06-14: 12 ug/min via INTRAVENOUS
  Administered 2024-06-14: 13 ug/min via INTRAVENOUS
  Administered 2024-06-14: 18 ug/min via INTRAVENOUS
  Administered 2024-06-14: 13 ug/min via INTRAVENOUS
  Administered 2024-06-14: 20 ug/min via INTRAVENOUS
  Administered 2024-06-14: 23 ug/min via INTRAVENOUS
  Administered 2024-06-15: 12 ug/min via INTRAVENOUS
  Administered 2024-06-15: 19 ug/min via INTRAVENOUS
  Administered 2024-06-15: 20 ug/min via INTRAVENOUS
  Administered 2024-06-15 – 2024-06-16 (×2): 14 ug/min via INTRAVENOUS
  Administered 2024-06-16: 12 ug/min via INTRAVENOUS
  Administered 2024-06-16: 11 ug/min via INTRAVENOUS
  Administered 2024-06-16: 14 ug/min via INTRAVENOUS
  Administered 2024-06-16: 11 ug/min via INTRAVENOUS
  Administered 2024-06-17: 14 ug/min via INTRAVENOUS
  Administered 2024-06-17: 13 ug/min via INTRAVENOUS
  Administered 2024-06-17 (×2): 14 ug/min via INTRAVENOUS
  Administered 2024-06-18: 13 ug/min via INTRAVENOUS
  Administered 2024-06-18: 11 ug/min via INTRAVENOUS
  Filled 2024-06-13 (×23): qty 250

## 2024-06-13 NOTE — Progress Notes (Addendum)
 Pharmacy Antibiotic Note  Patrick [name] is a 44 y.o. 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  Pharmacy has been consulted for vancomycin  dosing.   Vancomycin  kinetic parameters: ke = 0.123, half life ~5.6 hours, Vd ~51.6 L  Current dose of vancomycin  1750 mg every 12 hours resulted with a vancomycin  trough of 10 mcg/mL (collected 06/13/24 at 0834) which is subtherapeutic given that the goal vancomycin  trough range is 15-20 mcg/mL. Utilizing trough based dosing rather than AUC dosing given concern for CNS involvement. Renal function is numerically stable (Scr 0.74), afebrile, and WBC 26.4.   Plan: Adjust to vancomycin  1000 mg every 8 hours Goal trough of 15-20 mcg/mL Trend WBC, temp, renal function, clinical progress Obtain vancomycin  levels as needed   Height: 5' 11 (180.3 cm) Weight: 71.7 kg (158 lb 1.1 oz) IBW/kg (Calculated) : 75.3  Temp (24hrs), Avg:98.6 F (37 C), Min:98 F (36.7 C), Max:99.7 F (37.6 C)  Recent Labs  Lab 06/07/24 2331 06/07/24 2335 06/08/24 0123 06/08/24 0337 06/08/24 0828 06/10/24 0148 06/10/24 0958 06/11/24 0543 06/12/24 0616 06/13/24 0200 06/13/24 0223 06/13/24 0834  WBC 20.1*  --   --   --   --  23.1*  --  25.0* 29.0*  --  26.4*  --   CREATININE 1.00 1.10  --   --   --  0.72  --  0.84 0.80  --  0.74  --   LATICACIDVEN  --   --  2.5* 2.6* 1.5  --   --   --   --   --   --   --   VANCOTROUGH  --   --   --   --   --   --    < >  --   --   --  DISREGARD TEST RESULTS, TEST WILL BE CREDITED. TEST DRAWN BEFORE REQUESTED TIME. 10*  VANCOPEAK  --   --   --   --   --  21*  --   --   --  42*  --   --    < > = values in this interval not displayed.    Estimated Creatinine Clearance: 122 mL/min (by C-G formula based on SCr of 0.74 mg/dL).    Allergies[1]  Medications this admission:  Cefepime  12/31 x1 dose Vanc 12/31 >> Penicillin  G 1/2 >> Levofloxacin  1/5 >> (1/9)    Microbiology: 12/31 bcx: 4/4 MRSA 12/31 RVP: neg 1/1 BCx: ngtd x4 days HIV NR  Thank you for involving pharmacy in this patient's care.  Feliciano Close, PharmD PGY2 Infectious Diseases Pharmacy Resident            [1] No Known Allergies

## 2024-06-13 NOTE — Progress Notes (Addendum)
 eLink Physician-Brief Progress Note Patient Name: Patrick [name] DOB: 04-Sep-1980 MRN: [id number]   Date of Service  06/13/2024  HPI/Events of Note  ABG reviewed, borderline pO2 on 50% +5 Increased WOB with supraclavicular retractions, no sedation/analgesia  eICU Interventions  Utilize ordered dilaudid  for distress Increase PEEP to +8   0127 -refractory fever to 103.3, ice pack send patient, preserved renal function, no neurosyphilis on MR SA bacteremia.  One-time ibuprofen   Intervention Category Intermediate Interventions: Respiratory distress - evaluation and management  [name] 06/13/2024, 8:38 PM

## 2024-06-13 NOTE — Progress Notes (Addendum)
 1207:Pt transferred to 31M via bed with rapid response nurse and charge nurse at this time  1215:Report given to [place], [place]

## 2024-06-13 NOTE — Consult Note (Addendum)
 "  NAME:  Patrick [name], MRN:  [id number], DOB:  06/17/1980, LOS: 5 ADMISSION DATE:  06/07/2024 CONSULTATION DATE:  06/13/2024 REFERRING MD:  Vianne - TRH, CHIEF COMPLAINT:  Acute respiratory failure   History of Present Illness:  44 year old man who presented to [facility] 12/30 as a Code Stroke with L facial droop, L arm weakness and slurred speech. PMHx significant for arthritis, polysubstance abuse including IVDU.  On ED arrival, patient was altered with LKW ~0100 12/30. Unresponsive requiring BVM ventilations. Responsiveness improved with Narcan  administration, but ongoing facial asymmetry was noted. Labs were notable for WBC 20.1, Hgb 15.1, Plt 108. INR 1.3. Na 128, K 4.5, CO2 25, BUN/Cr 36/1.00. AST/ALT 144/71, Alk Phos 181, Tbili 0.9. Ethanol negative, ammonia 28. UDS +amphetamines, fentanyl . VBG pH 7.474/pCO2 36.6/bicarb 26.9. LA 2.5. PCT 3.05 BCID +MRSA. COVID/Flu/RSV negative. CT Head 12/30 showed intermediate-sized area of hypoattenuation within the R MCA territory c/w acute ischemic changes. CTA Head/Neck 12/31 negative for LVO, +atherosclerosis. CT Chest/A/P 12/31 showed multifocal PNA, diffuse bronchial wall thickening with moderate mucous impaction RLL, hepatosplenomegaly, R kidney atrophy, diverticulosis. MRI Brain demonstrated acute/subacute infarction at the R frontoparietal junction (R MCA territory) with mild HT, small areas of additional infarct/microhemorrhage. Ultimately admitted to [facility] service. TTE with concern for AV vegetation; ID was consulted for possible IE.  Cards was consulted for TEE.  On 1/5, Rapid Response was called for respiratory distress with tachypnea to 40s-50s, tachycardia to 120s, and SpO2 89% with increased WOB. PCCM was consulted and patient was transferred to ICU. Intubated on arrival to ICU.  Pertinent Medical History:   Past Medical History:  Diagnosis Date   Arthritis    IVDU (intravenous drug use)    MRSA bacteremia    Polysubstance abuse (HCC)     Significant Hospital Events: Including procedures, antibiotic start and stop dates in addition to other pertinent events   12/30 - Presented to ED as Code Stroke. 12/31 - TTE c/f AV vegetation/infective endocarditis, ID consulted, Cards consulted for TEE. 1/5 - Rapid Response for tachypnea, tachycardia, increased WOB. PCCM consulted. Transferred to ICU. Intubated. Levophed  initiated.  Interim History / Subjective:  PCCM consulted for ICU transfer.  Objective:  Blood pressure 129/74, pulse (!) 144, temperature 99.7 F (37.6 C), temperature source Axillary, resp. rate 18, height 5' 11 (1.803 m), weight 71.7 kg, SpO2 100%.    Vent Mode: PRVC FiO2 (%):  [100 %] 100 % Set Rate:  [18 bmp] 18 bmp Vt Set:  [600 mL] 600 mL PEEP:  [5 cmH20] 5 cmH20 Plateau Pressure:  [26 cmH20] 26 cmH20   Intake/Output Summary (Last 24 hours) at 06/13/2024 1441 Last data filed at 06/13/2024 1131 Gross per 24 hour  Intake 0 ml  Output 3725 ml  Net -3725 ml   Filed Weights   06/07/24 2338 06/09/24 0444 06/10/24 0417  Weight: 79.2 kg 74.3 kg 71.7 kg   Physical Examination: General: Acute-on-chronically ill-appearing middle-aged man in NAD. HEENT: [place]/AT, anicteric sclera, PERRL 2mm, moist mucous membranes. Neuro: Intubated, sedated. Does not respond to verbal, tactile or noxious stimuli. Not following commands. +Corneal, +Cough, and +Gag  CV: Tachycardic to 130s, regular rhythm, ?systolic murmur PULM: Breathing tachypneic to 30s and mildly labored on vent (PEEP 5, FiO2 50%). Lung fields coarse throughout, most notably over bilateral bases. GI: Soft, nontender, nondistended. Normoactive bowel sounds. Extremities: No LE edema noted. Skin: Warm/dry, +track marks.  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, ?neurosyphilis Elevated LFTs, HCVAb+, anti-HBc+ - Cardiology consulted, appreciate recommendations - TEE tentatively 1/6 - Cardiac monitoring - ID following, appreciate recs - Continue Levaquin , Vanc, Pen G (two week course, end 1/16) - Trend LFTs  R MCA territory ischemic infarct - Neuro following peripherally - F/u TEE  - Frequent neuro checks - Neuroprotective measures: HOB > 30 degrees, normoglycemia, normothermia, electrolytes WNL - PT/OT/SLP when able to participate in care  Acute hypoxemic respiratory failure Multifocal PNA - Continue full vent support (4-8cc/kg IBW) - Wean FiO2 for O2 sat > 90% - Daily WUA/SBT - VAP bundle - Pulmonary hygiene - PAD protocol for sedation: Propofol  and Fentanyl  for goal RASS 0 to -1 - Follow CXR, ABG  Polysubstance abuse, UDS +amphetamines/fentanyl  IVDU history Chronic pain Arthritis - TOC consult for substance abuse resources once able to participate in care - Limit sedating medications as able (once extubated)  Labs:  CBC: Recent Labs  Lab 06/07/24 2331 06/07/24 2334 06/07/24 2335 06/10/24 0148 06/11/24 0543 06/12/24 0616 06/13/24 0223  WBC 20.1*  --   --  23.1* 25.0* 29.0* 26.4*  NEUTROABS 16.9*  --   --   --   --   --   --   HGB 15.1   < > 16.3 13.1 12.0* 11.7* 11.4*  HCT 43.4   < > 48.0 36.9* 33.5* 34.0* 34.4*  MCV 84.4  --   --  82.2 81.9 84.2 88.4  PLT 108*  --   --  145* 180 187 137*   < > = values in this interval not displayed.   Basic Metabolic Panel: Recent Labs  Lab 06/07/24 2331 06/07/24 2334 06/07/24 2335 06/10/24 0148 06/11/24 0543 06/12/24 0616 06/13/24 0223  NA 128*   < > 130* 135 138 136 137  K 4.5   < > 4.1  3.5 3.5 3.9 4.0  CL 91*  --  94* 101 104 107 108  CO2 25  --   --  24 23 19* 16*  GLUCOSE 143*  --  139* 131* 159* 139* 127*  BUN 36*  --  37* 23* 21* 26* 29*  CREATININE 1.00  --  1.10 0.72 0.84 0.80 0.74  CALCIUM 9.0  --   --  8.2* 7.7* 7.8* 7.8*  MG  --   --   --  2.0 2.1 2.3 2.3  PHOS  --   --   --   --   --  3.5 3.6   < > = values in this interval not displayed.   GFR: Estimated Creatinine Clearance: 122 mL/min (by C-G formula based on SCr of 0.74 mg/dL). Recent Labs  Lab 06/08/24 0123 06/08/24 9662 06/08/24 0513 06/08/24 0828 06/10/24 0148 06/11/24 0543 06/12/24 0616 06/13/24 0223  PROCALCITON  --   --  3.05  --   --   --   --   --   WBC  --   --   --   --  23.1* 25.0* 29.0* 26.4*  LATICACIDVEN 2.5* 2.6*  --  1.5  --   --   --   --  Liver Function Tests: Recent Labs  Lab 06/07/24 2331 06/10/24 0148 06/11/24 0543 06/12/24 0616 06/13/24 0223  AST 144* 101* 66* 58* 68*  ALT 71* 68* 61* 44 52*  ALKPHOS 181* 208* 172* 148* 118  BILITOT 0.9 1.7* 1.6* 1.4* 1.1  PROT 8.1 6.8 7.4 7.8 7.8  ALBUMIN 3.3* 2.7* 2.8* 2.7* 2.7*   No results for input(s): LIPASE, AMYLASE in the last 168 hours. Recent Labs  Lab 06/07/24 2331  AMMONIA 28   ABG:    Component Value Date/Time   HCO3 26.6 06/08/2024 1319   TCO2 25 06/07/2024 2335   O2SAT 94.8 06/08/2024 1319    Coagulation Profile: Recent Labs  Lab 06/07/24 2331  INR 1.3*   Cardiac Enzymes: No results for input(s): CKTOTAL, CKMB, CKMBINDEX, TROPONINI in the last 168 hours.  HbA1C: Hgb A1c MFr Bld  Date/Time Value Ref Range Status  06/08/2024 09:03 AM 6.0 (H) 4.8 - 5.6 % Final    Comment:    (NOTE) Diagnosis of Diabetes The following HbA1c ranges recommended by the American Diabetes Association (ADA) may be used as an aid in the diagnosis of diabetes mellitus.  Hemoglobin             Suggested A1C NGSP%              Diagnosis  <5.7                   Non Diabetic  5.7-6.4                 Pre-Diabetic  >6.4                   Diabetic  <7.0                   Glycemic control for                       adults with diabetes.     CBG: Recent Labs  Lab 06/08/24 0006 06/08/24 2201 06/13/24 1224  GLUCAP 161* 123* 109*   Review of Systems:   Patient is encephalopathic and/or intubated; therefore, history has been obtained from chart review.   Past Medical History:  He,  has a past medical history of Arthritis, IVDU (intravenous drug use), MRSA bacteremia, and Polysubstance abuse (HCC).   Surgical History:  No past surgical history on file.   Social History:   reports that he has been smoking. He does not have any smokeless tobacco history on file. He reports current alcohol use. He reports that he does not use drugs.   Family History:  His family history is not on file.   Allergies: Allergies[1]   Home Medications: Prior to Admission medications  Medication Sig Start Date End Date Taking? Authorizing Provider  acetaminophen  (TYLENOL ) 500 MG tablet Take 1,000 mg by mouth every 6 (six) hours as needed for moderate pain.   Yes [provider]  albuterol  (PROVENTIL  HFA;VENTOLIN  HFA) 108 (90 Base) MCG/ACT inhaler Inhale 2 puffs into the lungs every 6 (six) hours as needed for wheezing or shortness of breath.   Yes [provider]  amoxicillin-clavulanate (AUGMENTIN) 875-125 MG tablet Take 1 tablet by mouth 2 (two) times daily. Patient not taking: Reported on 06/08/2024 06/07/24   [provider]    Critical care time:   The patient is critically ill with multiple organ system failure and requires high complexity decision making for assessment and support, frequent evaluation and  titration of therapies, advanced monitoring, review of radiographic studies and interpretation of complex data.   Critical Care Time devoted to patient care services, exclusive of separately billable procedures, described in this note is 41 minutes.  Corean CHRISTELLA Natthew Marlatt, PA-C [facility] 06/13/2024 2:41 PM  Please see [email or web address] for pager details.  From 7A-7P if no response, please call [phone] After hours, please call ELink [phone]     [1] No Known Allergies  "

## 2024-06-13 NOTE — Procedures (Signed)
 Central Venous Catheter Insertion Procedure Note  Jaramie Bastos  [id number]  Aug 22, 1980  Date:06/13/2024  Time:4:50 PM   Provider Performing:Vallory Oetken CHRISTELLA Ilah   Procedure: Insertion of Non-tunneled Central Venous [phone]) with US  guidance (23062)   Indication(s) Medication administration and Difficult access  Consent Unable to obtain consent due to emergent nature of procedure.  Anesthesia Topical only with 1% lidocaine    Timeout Verified patient identification, verified procedure, site/side was marked, verified correct patient position, special equipment/implants available, medications/allergies/relevant history reviewed, required imaging and test results available.  Sterile Technique Maximal sterile technique including full sterile barrier drape, hand hygiene, sterile gown, sterile gloves, mask, hair covering, sterile ultrasound probe cover (if used).  Procedure Description Area of catheter insertion was cleaned with chlorhexidine  and draped in sterile fashion.  With real-time ultrasound guidance a central venous catheter was placed into the left internal jugular vein. Nonpulsatile blood flow and easy flushing noted in all ports.  The catheter was sutured in place and sterile dressing applied.    Complications/Tolerance None; patient tolerated the procedure well. Chest X-ray is ordered to verify placement for internal jugular or subclavian cannulation.   Chest x-ray is not ordered for femoral cannulation.  EBL Minimal  Specimen(s) None  Corean CHRISTELLA Ilah, PA-C [facility] 06/13/2024 4:51 PM  Please see [email or web address] for pager details.  From 7A-7P if no response, please call [phone] After hours, please call ELink [phone]

## 2024-06-13 NOTE — Progress Notes (Signed)
 SLP Cancellation Note  Patient Details Name: Patrick [name] MRN: [id number] DOB: 05-12-1981   Cancelled treatment:        Pt NPO until TEE later this afternoon. Will continue attempts.   Dustin Olam Bull 06/13/2024, 9:25 AM

## 2024-06-13 NOTE — Progress Notes (Signed)
 "        [facility]  Date of Admission:  06/07/2024     Lines:  1/5-c ett 1/5-c left internal jugular cvc 1/5-c ngt    Abx: 06/13/24-c levo -- planned 5 days 06/11/24-c pcn g -- planned 2 weeks 12/31-c vanc   12/31 cefepime                                                                 Assessment: 44 yo male with ivdu (fentanyl ) admitted 12/30 with l facial droop and lue weakness found to have multifocal stroke, bilateral pulm airspace opacity, mrsa pna and tte finding of av vegetation, complicated by opiate withdrawal   Picture consistent with mrsa IE in setting ivdu. Probable given janeway vs osler's lesions on left palm/right sole   Very confused today due to withdrawal/stroke -- will need to reassess for metastatic pyogenic complication   Abd pelv chest ct no obvious spine or paraspinous involvement but if sx will need to get dedicated spine imaging   Chest airspace opacity could be aspiration or septic  pulm nodules from mrsa bsi. He is doing well on room air as of my initial visit  Micro: 12/31 bcx mrsa (S dapto) 1/1 rpr titer 1:128 1/1 hep b cAb positive; sAg negative 1/1 hcv ab positive 1/1 hiv ab negative 1/1 bcx negative 1/3 hep b dna negative 1/3 hcv rna 2.1 million units      --------- 1/5 id assessment Transferred to icu for hypoxic resp failure Started on presumed neurosyphilis tx late last week Repeat bcx negative Xr chest today multifocal bilateral airspace opacity ?acute heart failure vs aspiration pna vs septic emobolism from mrsa     Plan: Finish 2 weeks pcn g for neurosyphilis Finish 5 days levoflox for presumed aspiration pna Await tee to clarify av infection burden/destruction Continue vancomycin  -- cns coverage best over dapto Maintain contact isolation precaution Withdrawal and stroke management per primary team/neurology      Principal Problem:   Acute encephalopathy Active Problems:   MRSA  bacteremia   Neurosyphilis   Acute bacterial endocarditis   Allergies[1]  Scheduled Meds:  docusate  100 mg Per Tube BID   etomidate   20 mg Intravenous Once    morphine  injection  1 mg Intravenous Once   polyethylene glycol  17 g Per Tube Daily   sodium chloride  flush  3 mL Intravenous Once   Continuous Infusions:  sodium chloride  20 mL/hr at 06/13/24 0444   dexmedetomidine  Stopped (06/13/24 1533)   HYDROmorphone  3 mg/hr (06/13/24 1421)   levofloxacin  (LEVAQUIN ) IV 750 mg (06/13/24 1327)   norepinephrine  (LEVOPHED ) Adult infusion 20 mcg/min (06/13/24 1634)   penicillin  G potassium 24 Million Units in dextrose  5 % 500 mL CONTINUOUS infusion 24 Million Units (06/13/24 1428)   propofol  (DIPRIVAN ) infusion 20 mcg/kg/min (06/13/24 1537)   vancomycin      PRN Meds:.acetaminophen  **OR** acetaminophen , dexmedetomidine , haloperidol  lactate, HYDROmorphone , midazolam  PF, naLOXone  (NARCAN )  injection   SUBJECTIVE: Acute worsening hypoxic resp failure transferred to icu 1/5 and intubated. No new fever. Moderate leukocytosis stable Tte limited done so far doesn'st mention severe AV Tee deferred to Wednesday  Pulm want to add aspiration pna coverage  1/1 repeat bcx negative   Review of Systems: ROS  All other ROS was negative, except mentioned above     OBJECTIVE: Vitals:   06/13/24 1530 06/13/24 1535 06/13/24 1545 06/13/24 1600  BP: (!) 74/45  (!) 78/42   Pulse: (!) 124 (!) 116 (!) 110   Resp: (!) 36 (!) 36 (!) 33   Temp:      TempSrc:      SpO2: 100% 100% 100% 96%  Weight:      Height:       Body mass index is 22.05 kg/m.  Physical Exam General/constitutional: acutely ill appearing; intubated sedated/comatose HEENT: Normocephalic, PER CV: tachy, heaving heart beat; systolic murmur Lungs: coarse on vent Abd: Soft Ext: no edema Skin: petehciael changes left palm and right sole Neuro: intubated sedated MSK: no peripheral joint swelling/tenderness/warmth   Central  line presence: left internal jugular cvc site no bleeding   Lab Results Lab Results  Component Value Date   WBC 26.4 (H) 06/13/2024   HGB 12.2 (L) 06/13/2024   HCT 36.0 (L) 06/13/2024   MCV 88.4 06/13/2024   PLT 137 (L) 06/13/2024    Lab Results  Component Value Date   CREATININE 0.74 06/13/2024   BUN 29 (H) 06/13/2024   NA 143 06/13/2024   K 4.7 06/13/2024   CL 108 06/13/2024   CO2 16 (L) 06/13/2024    Lab Results  Component Value Date   ALT 52 (H) 06/13/2024   AST 68 (H) 06/13/2024   GGT 68 (H) 06/08/2024   ALKPHOS 118 06/13/2024   BILITOT 1.1 06/13/2024      Microbiology: Recent Results (from the past 240 hours)  Blood culture (routine x 2)     Status: Abnormal (Preliminary result)   Collection Time: 06/08/24 12:35 AM   Specimen: BLOOD RIGHT ARM  Result Value Ref Range Status   Specimen Description BLOOD RIGHT ARM  Final   Special Requests   Final    BOTTLES DRAWN AEROBIC AND ANAEROBIC Blood Culture adequate volume   Culture  Setup Time   Final    GRAM POSITIVE COCCI IN CLUSTERS IN BOTH AEROBIC AND ANAEROBIC BOTTLES CRITICAL RESULT CALLED TO, READ BACK BY AND VERIFIED WITH: PHARMD J.FRENS AT 1213 ON 06/08/2024 BY T.SAAD.    Culture (A)  Final    METHICILLIN RESISTANT STAPHYLOCOCCUS AUREUS Sent to [facility] for further susceptibility testing. Performed at [facility], [address], [place], [place] [address]    Report Status PENDING  Incomplete   Organism ID, Bacteria METHICILLIN RESISTANT STAPHYLOCOCCUS AUREUS  Final      Susceptibility   Methicillin resistant staphylococcus aureus - MIC*    CIPROFLOXACIN >=8 RESISTANT Resistant     ERYTHROMYCIN >=8 RESISTANT Resistant     GENTAMICIN <=0.5 SENSITIVE Sensitive     OXACILLIN >=4 RESISTANT Resistant     TETRACYCLINE <=1 SENSITIVE Sensitive     VANCOMYCIN  1 SENSITIVE Sensitive     TRIMETH/SULFA >=320 RESISTANT Resistant     CLINDAMYCIN >=8 RESISTANT Resistant     RIFAMPIN <=0.5 SENSITIVE Sensitive      Inducible Clindamycin NEGATIVE Sensitive     LINEZOLID 2 SENSITIVE Sensitive     * METHICILLIN RESISTANT STAPHYLOCOCCUS AUREUS  Blood Culture ID Panel (Reflexed)     Status: Abnormal   Collection Time: 06/08/24 12:35 AM  Result Value Ref Range Status   Enterococcus faecalis NOT DETECTED NOT DETECTED Final   Enterococcus Faecium NOT DETECTED NOT DETECTED Final   Listeria monocytogenes NOT DETECTED NOT DETECTED Final   Staphylococcus species DETECTED (A) NOT  DETECTED Final    Comment: CRITICAL RESULT CALLED TO, READ BACK BY AND VERIFIED WITH: PHARMD J.FRENS AT 1213 ON 06/08/2024 BY T.SAAD.    Staphylococcus aureus (BCID) DETECTED (A) NOT DETECTED Final    Comment: Methicillin (oxacillin)-resistant Staphylococcus aureus (MRSA). MRSA is predictably resistant to beta-lactam antibiotics (except ceftaroline). Preferred therapy is vancomycin  unless clinically contraindicated. Patient requires contact precautions if  hospitalized. CRITICAL RESULT CALLED TO, READ BACK BY AND VERIFIED WITH: PHARMD J.FRENS AT 1213 ON 06/08/2024 BY T.SAAD.    Staphylococcus epidermidis NOT DETECTED NOT DETECTED Final   Staphylococcus lugdunensis NOT DETECTED NOT DETECTED Final   Streptococcus species NOT DETECTED NOT DETECTED Final   Streptococcus agalactiae NOT DETECTED NOT DETECTED Final   Streptococcus pneumoniae NOT DETECTED NOT DETECTED Final   Streptococcus pyogenes NOT DETECTED NOT DETECTED Final   A.calcoaceticus-baumannii NOT DETECTED NOT DETECTED Final   Bacteroides fragilis NOT DETECTED NOT DETECTED Final   Enterobacterales NOT DETECTED NOT DETECTED Final   Enterobacter cloacae complex NOT DETECTED NOT DETECTED Final   Escherichia coli NOT DETECTED NOT DETECTED Final   Klebsiella aerogenes NOT DETECTED NOT DETECTED Final   Klebsiella oxytoca NOT DETECTED NOT DETECTED Final   Klebsiella pneumoniae NOT DETECTED NOT DETECTED Final   Proteus species NOT DETECTED NOT DETECTED Final   Salmonella species  NOT DETECTED NOT DETECTED Final   Serratia marcescens NOT DETECTED NOT DETECTED Final   Haemophilus influenzae NOT DETECTED NOT DETECTED Final   Neisseria meningitidis NOT DETECTED NOT DETECTED Final   Pseudomonas aeruginosa NOT DETECTED NOT DETECTED Final   Stenotrophomonas maltophilia NOT DETECTED NOT DETECTED Final   Candida albicans NOT DETECTED NOT DETECTED Final   Candida auris NOT DETECTED NOT DETECTED Final   Candida glabrata NOT DETECTED NOT DETECTED Final   Candida krusei NOT DETECTED NOT DETECTED Final   Candida parapsilosis NOT DETECTED NOT DETECTED Final   Candida tropicalis NOT DETECTED NOT DETECTED Final   Cryptococcus neoformans/gattii NOT DETECTED NOT DETECTED Final   Meth resistant mecA/C and MREJ DETECTED (A) NOT DETECTED Final    Comment: CRITICAL RESULT CALLED TO, READ BACK BY AND VERIFIED WITH: PHARMD J.FRENS AT 1213 ON 06/08/2024 BY T.SAAD. Performed at [facility], [address] [place], [place] [address]   MIC (1 Drug)-     Status: Abnormal   Collection Time: 06/08/24 12:35 AM  Result Value Ref Range Status   Min Inhibitory Conc (1 Drug) Final report (A)  Corrected    Comment: (NOTE) Performed At: [facility] [address] [place], [place] [id number] Jennette Shorter MD Ey:[id number] CORRECTED ON 01/05 AT 1636: PREVIOUSLY REPORTED AS Preliminary report    Source [id number] MRSA BLOOD DAPTOMYCIN  Final    Comment: Performed at [facility], [address], [place], [place] [address]  MIC Result     Status: Abnormal   Collection Time: 06/08/24 12:35 AM  Result Value Ref Range Status   Result 1 (MIC) Comment (A)  Final    Comment: (NOTE) Methicillin - resistant Staphylococcus aureus Identification performed by account, not confirmed by this laboratory. DAPTOMYCIN .50ug/ml SUSCPETIBLE Performed At: [facility] [address] [place], [place] [id number] Jennette Shorter MD Ey:[id number]   Blood culture (routine x 2)      Status: Abnormal   Collection Time: 06/08/24 12:40 AM   Specimen: BLOOD LEFT ARM  Result Value Ref Range Status   Specimen Description BLOOD LEFT ARM  Final   Special Requests   Final    BOTTLES DRAWN  AEROBIC AND ANAEROBIC Blood Culture adequate volume   Culture  Setup Time   Final    GRAM POSITIVE COCCI IN CLUSTERS IN BOTH AEROBIC AND ANAEROBIC BOTTLES CRITICAL VALUE NOTED.  VALUE IS CONSISTENT WITH PREVIOUSLY REPORTED AND CALLED VALUE.    Culture (A)  Final    STAPHYLOCOCCUS AUREUS SUSCEPTIBILITIES PERFORMED ON PREVIOUS CULTURE WITHIN THE LAST 5 DAYS. Performed at [facility], [address], [place], [place] [address]    Report Status 06/10/2024 FINAL  Final  Resp panel by RT-PCR (RSV, Flu A&B, Covid) Anterior Nasal Swab     Status: None   Collection Time: 06/08/24  3:54 AM   Specimen: Anterior Nasal Swab  Result Value Ref Range Status   SARS Coronavirus 2 by RT PCR NEGATIVE NEGATIVE Final   Influenza A by PCR NEGATIVE NEGATIVE Final   Influenza B by PCR NEGATIVE NEGATIVE Final    Comment: (NOTE) The Xpert Xpress SARS-CoV-2/FLU/RSV plus assay is intended as an aid in the diagnosis of influenza from Nasopharyngeal swab specimens and should not be used as a sole basis for treatment. Nasal washings and aspirates are unacceptable for Xpert Xpress SARS-CoV-2/FLU/RSV testing.  Fact Sheet for Patients: bloggercourse.com  Fact Sheet for Healthcare Providers: seriousbroker.it  This test is not yet approved or cleared by the [place]  FDA and has been authorized for detection and/or diagnosis of SARS-CoV-2 by FDA under an Emergency Use Authorization (EUA). This EUA will remain in effect (meaning this test can be used) for the duration of the COVID-19 declaration under Section 564(b)(1) of the Act, 21 U.S.C. section 360bbb-3(b)(1), unless the authorization is terminated or revoked.     Resp Syncytial Virus by PCR  NEGATIVE NEGATIVE Final    Comment: (NOTE) Fact Sheet for Patients: bloggercourse.com  Fact Sheet for Healthcare Providers: seriousbroker.it  This test is not yet approved or cleared by the [place]  FDA and has been authorized for detection and/or diagnosis of SARS-CoV-2 by FDA under an Emergency Use Authorization (EUA). This EUA will remain in effect (meaning this test can be used) for the duration of the COVID-19 declaration under Section 564(b)(1) of the Act, 21 U.S.C. section 360bbb-3(b)(1), unless the authorization is terminated or revoked.  Performed at [facility], [address], [place], [place] [address]   Culture, blood (Routine X 2) w Reflex to ID Panel     Status: None (Preliminary result)   Collection Time: 06/09/24  5:36 AM   Specimen: BLOOD  Result Value Ref Range Status   Specimen Description BLOOD SITE NOT SPECIFIED  Final   Special Requests   Final    BOTTLES DRAWN AEROBIC AND ANAEROBIC Blood Culture results may not be optimal due to an inadequate volume of blood received in culture bottles   Culture   Final    NO GROWTH 4 DAYS Performed at [facility], [address], [place], [place] [address]    Report Status PENDING  Incomplete  Culture, blood (Routine X 2) w Reflex to ID Panel     Status: None (Preliminary result)   Collection Time: 06/09/24  5:36 AM   Specimen: BLOOD  Result Value Ref Range Status   Specimen Description BLOOD SITE NOT SPECIFIED  Final   Special Requests   Final    BOTTLES DRAWN AEROBIC AND ANAEROBIC Blood Culture adequate volume   Culture   Final    NO GROWTH 4 DAYS Performed at [facility], [address], [place], [place]  [address]    Report Status PENDING  Incomplete     Serology:   Imaging: If present, new imagings (plain films, ct scans, and mri) have been personally visualized and interpreted; radiology reports have been reviewed. Decision making  incorporated into the Impression / Recommendations.  12/31 ct c/a/p 1. Right lower lobe posterior basal consolidation and additional right upper and middle lobe peripheral consolidations, most consistent with multifocal pneumonia. Additional considerations regarding the scattered areas of peripheral consolidation within the right upper and right middle lobe are as described above, and correlation with the patient's immunocompetency status is warranted. 2. Diffuse bronchial wall thickening with moderate mucus impaction in right lower lobe segmental bronchi, consistent with airway inflammation. 3. Hepatosplenomegaly, new from prior examination. 4. Indeterminate noncalcified left upper lobe pulmonary nodule, with recommendation for non-contrast chest CT at 3-6 months and then consideration of non-contrast chest CT at 18-24 months as per Fleischner Society Guidelines. 5. Marked atrophy of the right kidney with compensatory hypertrophy of the left kidney. 6. Moderate sigmoid diverticulosis without acute inflammatory change. 7. Mild coronary artery calcification and mild aortoiliac atherosclerotic calcification. 8. Partially visualized moderate right scrotal hydrocele.  12/31 mri brain 1. Acute/subacute infarction at the right frontoparietal junction/insular region (right MCA territory) with associated hemosiderin staining, most compatible with mild hemorrhagic transformation/oozing. 2. Acute infarct in the left cerebellar hemisphere. 3. Small acute lacunar infarcts in the left temporal lobe. 4. Additional focus in the anteromedial right frontal subcortical white matter, compatible with an additional acute infarct. 5. Blooming artifact in the posteromedial right occipital lobe, most consistent with a small chronic hemorrhagic focus (e.g., microhemorrhage). 6. Mucosal disease in the ethmoid and maxillary sinuses.  1/5 cxr FINDINGS: The patient is intubated. The tip of the endotracheal tube  is 7.2 cm above the carina. Increased pulmonary vascular congestion without overt edema. New left mid and bilateral lower lobe patchy airspace opacities. Possible small left pleural effusion.   IMPRESSION: Multifocal patchy airspace opacities including both mid lungs and both lower lobes consistent with multifocal pneumonia.   Endotracheal tube is in good position 7.2 cm above the carina.   Possible small left pleural effusion.   1/5 tte  1. Tachycardic 130's. Left ventricular ejection fraction, by estimation,  is 60 to 65%. The left ventricle has normal function.   2. The mitral valve was not well visualized.   3. Small linear aortic vegetation noted, 1.4 x 0.6 cm. The aortic valve  is abnormal.   Conclusion(s)/Recommendation(s): Recommend TEE to further clarify valves.   Constance ONEIDA Passer, MD [facility] [phone] pager    06/13/2024, 5:09 PM     [1] No Known Allergies  "

## 2024-06-13 NOTE — Progress Notes (Addendum)
 OT Cancellation Note  Patient Details Name: Patrick [name] MRN: [id number] DOB: [birth year]   Cancelled Treatment:    Reason Eval/Treat Not Completed: Medical issues which prohibited therapy. Pt with red MEWS this morning, and ultimately rapid response called for elevated HR, RR and O2 needs; transferred to ICU. OT to follow up as pt medically appropriate.   Rayvn Rickerson D Walton, OTD, OTR/L [facility] Acute Rehabilitation Office: [phone]   [name] 06/13/2024, 1:00 PM

## 2024-06-13 NOTE — Progress Notes (Signed)
 I was asked to see this patient who is on the schedule for a TEE today because there were prior concerns for altered mental status. When I went and saw him he was in respiratory distress, Had sinus tach in the 110-120's, a respiratory rate in the 40-50's, and a SPO2 of 89. He had difficult answering my questions because of his shortness of breath.  Discussed the patient with Dr. Santo and will reschedule for a TEE on Wednesday.  Plan to reschedule the TEE for Wednesday.  Also plan to see the patient tomorrow to to see if his respiratory status has improved.  Signed,  Morse Clause, PA-C 06/13/2024, 9:27 AM

## 2024-06-13 NOTE — Progress Notes (Deleted)
 Pharmacy Antibiotic Note  Patrick [name] is a 44 y.o. 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  Pharmacy has been consulted for vancomycin  dosing.   1/5 AM levels assessment: Vanco peak 42 mcg/mL (1/5 0200) Vanco trough 10 mcg/mL (1/5 0834} cAUC 905 / Tss 9.7  Plan: Change vancomycin  750 mg IV q8h Estimated AUC: 580 / Tss 10 Trend WBC, temp, renal function  F/U infectious work-up Vancomycin  levels at steady-state   Height: 5' 11 (180.3 cm) Weight: 71.7 kg (158 lb 1.1 oz) IBW/kg (Calculated) : 75.3  Temp (24hrs), Avg:99 F (37.2 C), Min:98 F (36.7 C), Max:100.4 F (38 C)  Recent Labs  Lab 06/07/24 2331 06/07/24 2335 06/08/24 0123 06/08/24 0337 06/08/24 0828 06/10/24 0148 06/10/24 0958 06/11/24 0543 06/12/24 0616 06/13/24 0200 06/13/24 0223 06/13/24 0834  WBC 20.1*  --   --   --   --  23.1*  --  25.0* 29.0*  --  26.4*  --   CREATININE 1.00 1.10  --   --   --  0.72  --  0.84 0.80  --  0.74  --   LATICACIDVEN  --   --  2.5* 2.6* 1.5  --   --   --   --   --   --   --   VANCOTROUGH  --   --   --   --   --   --    < >  --   --   --  DISREGARD TEST RESULTS, TEST WILL BE CREDITED. TEST DRAWN BEFORE REQUESTED TIME. 10*  VANCOPEAK  --   --   --   --   --  21*  --   --   --  42*  --   --    < > = values in this interval not displayed.    Estimated Creatinine Clearance: 122 mL/min (by C-G formula based on SCr of 0.74 mg/dL).    Allergies[1]  Vanc 12/31 >>   12/31 bcx: 4/4 MRSA 12/31 RVP: neg 1/1 BCx: ngtd HIV NR  Thank you for involving pharmacy in this patient's care.   Benedetta Heath BS, PharmD, BCPS Clinical Pharmacist 06/13/2024 11:01 AM  Contact: [phone] after 3 PM    [1] No Known Allergies

## 2024-06-13 NOTE — Progress Notes (Signed)
 " PROGRESS NOTE  Patrick [name] FMW:[id number] DOB: 1980-10-08 DOA: 06/07/2024 PCP: Patient, No Pcp Per   LOS: 5 days   Brief Narrative / Interim history: 44 year old male with history of polysubstance abuse/IVDU who comes into the hospital with left facial droop, left upper extremity weakness and confusion.  He was found to have acute/subacute stroke on MRI in the setting of MRSA bacteremia and possible aortic valve vegetation on 2D echo.  Neurology, ID consulted  Subjective / 24h Interval events: If her curve is improving, however he has been having increasing respiratory distress, increasing tachycardic and increasing oxygen needs  Assesement and Plan: Principal problem Severe sepsis due to MRSA bacteremia -patient found to have MRSA bacteremia, with high suspicion for IE based on a 2D echo.  Brain imaging also showed to have multiple CVAs likely due to septic emboli.  CT scan of the chest also showed multifocal pneumonia - Patient remains with ongoing sepsis physiology, fever curve is improving now - ID following, continue antibiotics with vancomycin .  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  Imaging on admission with MRI showed acute/subacute infarction in the right frontoparietal junction/insular region on the right MCA territory with mild hemorrhagic transformation.  It also showed acute infarct in the left cerebellar hemisphere and small acute lacunar infarcts in the left temporal lobe.  CT angiogram without LVO.  A1c 6.0, LDL 5.  Underwent a 2D echocardiogram which was unable to rule out oscillating density on the aortic valve.  TEE is pending, however has been increasingly tachypneic and tachycardic today, and this is canceled - Attempt again on Wednesday  Acute hypoxic respiratory failure-has been having worsening hypoxia now requiring 6 L, increased work of breathing.  Will obtain chest x-ray, will consult PCCM to evaluate whether he would  be better suited in the ICU.  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  Unclear that he was ever diagnosed or treated for syphilis in the past, therefore treating as for neurosyphilis with IV penicillin .  Continue penicillin   Hep B, C positivity -per ID  Prediabetes-A1c 6.0.  Monitor  IVDU -he was slightly more agitated yesterday, but today he is improved  Elevated blood pressure -not taking any medications, no history of HTN.  May need initiation if remains persistent but now hold off in the setting of ongoing sepsis physiology  Dysphagia -SLP consulted, thankfully he passed evaluation and he is now on a dysphagia 1 diet.  Unable to eat now due to tachypnea/tachycardia  Scheduled Meds:  ipratropium-albuterol   3 mL Nebulization Q6H    morphine  injection  1 mg Intravenous Once   sodium chloride  flush  3 mL Intravenous Once   Continuous Infusions:  sodium chloride  20 mL/hr at 06/13/24 0444   dextrose  5 % and 0.45 % NaCl 100 mL/hr at 06/13/24 1013   penicillin  G potassium 24 Million Units in dextrose  5 % 500 mL CONTINUOUS infusion 24 Million Units (06/12/24 1224)   vancomycin      PRN Meds:.acetaminophen  **OR** acetaminophen , buprenorphine -naloxone , haloperidol  lactate, LORazepam , naLOXone  (NARCAN )  injection  Current Outpatient Medications  Medication Instructions   acetaminophen  (TYLENOL ) 1,000 mg, Oral, Every 6 hours PRN   albuterol  (PROVENTIL  HFA;VENTOLIN  HFA) 108 (90 Base) MCG/ACT inhaler 2 puffs, Inhalation, Every 6 hours PRN   amoxicillin-clavulanate (AUGMENTIN) 875-125 MG tablet 1 tablet, 2 times daily    Diet Orders (From admission, onward)     Start     Ordered   06/13/24 0001  Diet NPO time specified  Diet effective midnight       Comments: Patient to remain NPO until fully awake following the TEE.   06/12/24 1519           DVT prophylaxis: SCDs Start: 06/08/24 0540  Lab Results  Component Value Date   PLT 137 (L)  06/13/2024     Code Status: Full Code  Family Communication: Twin brother present at bedside, spoke with daughter Judeth [phone] over the weekend  Status is: Inpatient Remains inpatient appropriate because: Severity of illness   Level of care: Progressive  Consultants:  Neurology ID  Objective: Vitals:   06/12/24 2000 06/13/24 0400 06/13/24 0839 06/13/24 1126  BP: 130/78 124/76    Pulse:  (!) 110    Resp:      Temp: 98.4 F (36.9 C) 98 F (36.7 C) 98.3 F (36.8 C) 99.7 F (37.6 C)  TempSrc: Axillary Axillary Axillary Axillary  SpO2:  98%    Weight:      Height:        Intake/Output Summary (Last 24 hours) at 06/13/2024 1134 Last data filed at 06/13/2024 1131 Gross per 24 hour  Intake 0 ml  Output 3725 ml  Net -3725 ml   Wt Readings from Last 3 Encounters:  06/10/24 71.7 kg    Examination:  Constitutional: In distress, increased work of breathing Eyes: lids and conjunctivae normal, no scleral icterus ENMT: mmm Neck: normal, supple Respiratory: Very tachypneic, shallow respirations, bilateral rhonchi Cardiovascular: Regular rate and rhythm, no murmurs / rubs / gallops. No LE edema. Abdomen: soft, no distention, no tenderness. Bowel sounds positive.  Skin: no rashes Neurologic: Good strength except for left upper extremity, follows commands  Data Reviewed: I have independently reviewed following labs and imaging studies   CBC Recent Labs  Lab 06/07/24 2331 06/07/24 2334 06/07/24 2335 06/10/24 0148 06/11/24 0543 06/12/24 0616 06/13/24 0223  WBC 20.1*  --   --  23.1* 25.0* 29.0* 26.4*  HGB 15.1   < > 16.3 13.1 12.0* 11.7* 11.4*  HCT 43.4   < > 48.0 36.9* 33.5* 34.0* 34.4*  PLT 108*  --   --  145* 180 187 137*  MCV 84.4  --   --  82.2 81.9 84.2 88.4  MCH 29.4  --   --  29.2 29.3 29.0 29.3  MCHC 34.8  --   --  35.5 35.8 34.4 33.1  RDW 13.7  --   --  13.9 13.9 14.4 14.7  LYMPHSABS 1.0  --   --   --   --   --   --   MONOABS 1.7*  --   --   --   --    --   --   EOSABS 0.0  --   --   --   --   --   --   BASOSABS 0.1  --   --   --   --   --   --    < > = values in this interval not displayed.    Recent Labs  Lab 06/07/24 2331 06/07/24 2334 06/07/24 2335 06/08/24 0123 06/08/24 9662 06/08/24 0513 06/08/24 9171 06/08/24 0903 06/10/24 0148 06/11/24 0543 06/12/24 0616 06/13/24 0223  NA 128*   < > 130*  --   --   --   --   --  135 138 136 137  K 4.5   < > 4.1  --   --   --   --   --  3.5 3.5 3.9 4.0  CL 91*  --  94*  --   --   --   --   --  101 104 107 108  CO2 25  --   --   --   --   --   --   --  24 23 19* 16*  GLUCOSE 143*  --  139*  --   --   --   --   --  131* 159* 139* 127*  BUN 36*  --  37*  --   --   --   --   --  23* 21* 26* 29*  CREATININE 1.00  --  1.10  --   --   --   --   --  0.72 0.84 0.80 0.74  CALCIUM 9.0  --   --   --   --   --   --   --  8.2* 7.7* 7.8* 7.8*  AST 144*  --   --   --   --   --   --   --  101* 66* 58* 68*  ALT 71*  --   --   --   --   --   --   --  68* 61* 44 52*  ALKPHOS 181*  --   --   --   --   --   --   --  208* 172* 148* 118  BILITOT 0.9  --   --   --   --   --   --   --  1.7* 1.6* 1.4* 1.1  ALBUMIN 3.3*  --   --   --   --   --   --   --  2.7* 2.8* 2.7* 2.7*  MG  --   --   --   --   --   --   --   --  2.0 2.1 2.3 2.3  PROCALCITON  --   --   --   --   --  3.05  --   --   --   --   --   --   LATICACIDVEN  --   --   --  2.5* 2.6*  --  1.5  --   --   --   --   --   INR 1.3*  --   --   --   --   --   --   --   --   --   --   --   HGBA1C  --   --   --   --   --   --   --  6.0*  --   --   --   --   AMMONIA 28  --   --   --   --   --   --   --   --   --   --   --    < > = values in this interval not displayed.    ------------------------------------------------------------------------------------------------------------------ No results for input(s): CHOL, HDL, LDLCALC, TRIG, CHOLHDL, LDLDIRECT in the last 72 hours.   Lab Results  Component Value Date   HGBA1C 6.0 (H) 06/08/2024    ------------------------------------------------------------------------------------------------------------------ No results for input(s): TSH, T4TOTAL, T3FREE, THYROIDAB in the last 72 hours.  Invalid input(s): FREET3  Cardiac Enzymes No results for input(s): CKMB, TROPONINI, MYOGLOBIN in the last 168 hours.  Invalid input(s): CK ------------------------------------------------------------------------------------------------------------------ No results found for: BNP  CBG: Recent Labs  Lab 06/08/24 0006 06/08/24 2201  GLUCAP 161* 123*    Recent Results (from the past 240 hours)  Blood culture (routine x 2)     Status: Abnormal (Preliminary result)   Collection Time: 06/08/24 12:35 AM   Specimen: BLOOD RIGHT ARM  Result Value Ref Range Status   Specimen Description BLOOD RIGHT ARM  Final   Special Requests   Final    BOTTLES DRAWN AEROBIC AND ANAEROBIC Blood Culture adequate volume   Culture  Setup Time   Final    GRAM POSITIVE COCCI IN CLUSTERS IN BOTH AEROBIC AND ANAEROBIC BOTTLES CRITICAL RESULT CALLED TO, READ BACK BY AND VERIFIED WITH: PHARMD J.FRENS AT 1213 ON 06/08/2024 BY T.SAAD.    Culture (A)  Final    METHICILLIN RESISTANT STAPHYLOCOCCUS AUREUS Sent to [facility] for further susceptibility testing. Performed at [facility], [address], [place], [place] [address]    Report Status PENDING  Incomplete   Organism ID, Bacteria METHICILLIN RESISTANT STAPHYLOCOCCUS AUREUS  Final      Susceptibility   Methicillin resistant staphylococcus aureus - MIC*    CIPROFLOXACIN >=8 RESISTANT Resistant     ERYTHROMYCIN >=8 RESISTANT Resistant     GENTAMICIN <=0.5 SENSITIVE Sensitive     OXACILLIN >=4 RESISTANT Resistant     TETRACYCLINE <=1 SENSITIVE Sensitive     VANCOMYCIN  1 SENSITIVE Sensitive     TRIMETH/SULFA >=320 RESISTANT Resistant     CLINDAMYCIN >=8 RESISTANT Resistant     RIFAMPIN <=0.5 SENSITIVE Sensitive     Inducible  Clindamycin NEGATIVE Sensitive     LINEZOLID 2 SENSITIVE Sensitive     * METHICILLIN RESISTANT STAPHYLOCOCCUS AUREUS  Blood Culture ID Panel (Reflexed)     Status: Abnormal   Collection Time: 06/08/24 12:35 AM  Result Value Ref Range Status   Enterococcus faecalis NOT DETECTED NOT DETECTED Final   Enterococcus Faecium NOT DETECTED NOT DETECTED Final   Listeria monocytogenes NOT DETECTED NOT DETECTED Final   Staphylococcus species DETECTED (A) NOT DETECTED Final    Comment: CRITICAL RESULT CALLED TO, READ BACK BY AND VERIFIED WITH: PHARMD J.FRENS AT 1213 ON 06/08/2024 BY T.SAAD.    Staphylococcus aureus (BCID) DETECTED (A) NOT DETECTED Final    Comment: Methicillin (oxacillin)-resistant Staphylococcus aureus (MRSA). MRSA is predictably resistant to beta-lactam antibiotics (except ceftaroline). Preferred therapy is vancomycin  unless clinically contraindicated. Patient requires contact precautions if  hospitalized. CRITICAL RESULT CALLED TO, READ BACK BY AND VERIFIED WITH: PHARMD J.FRENS AT 1213 ON 06/08/2024 BY T.SAAD.    Staphylococcus epidermidis NOT DETECTED NOT DETECTED Final   Staphylococcus lugdunensis NOT DETECTED NOT DETECTED Final   Streptococcus species NOT DETECTED NOT DETECTED Final   Streptococcus agalactiae NOT DETECTED NOT DETECTED Final   Streptococcus pneumoniae NOT DETECTED NOT DETECTED Final   Streptococcus pyogenes NOT DETECTED NOT DETECTED Final   A.calcoaceticus-baumannii NOT DETECTED NOT DETECTED Final   Bacteroides fragilis NOT DETECTED NOT DETECTED Final   Enterobacterales NOT DETECTED NOT DETECTED Final   Enterobacter cloacae complex NOT DETECTED NOT DETECTED Final   Escherichia coli NOT DETECTED NOT DETECTED Final   Klebsiella aerogenes NOT DETECTED NOT DETECTED Final   Klebsiella oxytoca NOT DETECTED NOT DETECTED Final   Klebsiella pneumoniae NOT DETECTED NOT DETECTED Final   Proteus species NOT DETECTED NOT DETECTED Final   Salmonella species NOT DETECTED  NOT DETECTED Final   Serratia marcescens NOT DETECTED NOT DETECTED Final   Haemophilus influenzae NOT DETECTED NOT DETECTED Final   Neisseria meningitidis  NOT DETECTED NOT DETECTED Final   Pseudomonas aeruginosa NOT DETECTED NOT DETECTED Final   Stenotrophomonas maltophilia NOT DETECTED NOT DETECTED Final   Candida albicans NOT DETECTED NOT DETECTED Final   Candida auris NOT DETECTED NOT DETECTED Final   Candida glabrata NOT DETECTED NOT DETECTED Final   Candida krusei NOT DETECTED NOT DETECTED Final   Candida parapsilosis NOT DETECTED NOT DETECTED Final   Candida tropicalis NOT DETECTED NOT DETECTED Final   Cryptococcus neoformans/gattii NOT DETECTED NOT DETECTED Final   Meth resistant mecA/C and MREJ DETECTED (A) NOT DETECTED Final    Comment: CRITICAL RESULT CALLED TO, READ BACK BY AND VERIFIED WITH: PHARMD J.FRENS AT 1213 ON 06/08/2024 BY T.SAAD. Performed at [facility], [address], [place], [place] [address]   MIC (1 Drug)-     Status: Abnormal (Preliminary result)   Collection Time: 06/08/24 12:35 AM  Result Value Ref Range Status   Min Inhibitory Conc (1 Drug) Preliminary report (A)  Final    Comment: (NOTE) Performed At: [facility] [address] [place], [place] [id number] Jennette Shorter MD Ey:[id number]    Source [phone] MRSA BLOOD DAPTOMYCIN  Final    Comment: Performed at [facility], [address], [place], [place] [address]  MIC Result     Status: Abnormal   Collection Time: 06/08/24 12:35 AM  Result Value Ref Range Status   Result 1 (MIC) Comment (A)  Final    Comment: (NOTE) Methicillin - resistant Staphylococcus aureus Identification performed by account, not confirmed by this laboratory. DAPTOMYCIN Performed At: [facility] [address] [place], [place] [id number] Jennette Shorter MD Ey:[id number]   Blood culture (routine x 2)     Status: Abnormal   Collection Time: 06/08/24 12:40 AM   Specimen: BLOOD LEFT ARM   Result Value Ref Range Status   Specimen Description BLOOD LEFT ARM  Final   Special Requests   Final    BOTTLES DRAWN AEROBIC AND ANAEROBIC Blood Culture adequate volume   Culture  Setup Time   Final    GRAM POSITIVE COCCI IN CLUSTERS IN BOTH AEROBIC AND ANAEROBIC BOTTLES CRITICAL VALUE NOTED.  VALUE IS CONSISTENT WITH PREVIOUSLY REPORTED AND CALLED VALUE.    Culture (A)  Final    STAPHYLOCOCCUS AUREUS SUSCEPTIBILITIES PERFORMED ON PREVIOUS CULTURE WITHIN THE LAST 5 DAYS. Performed at [facility], [address], [place], [place] [address]    Report Status 06/10/2024 FINAL  Final  Resp panel by RT-PCR (RSV, Flu A&B, Covid) Anterior Nasal Swab     Status: None   Collection Time: 06/08/24  3:54 AM   Specimen: Anterior Nasal Swab  Result Value Ref Range Status   SARS Coronavirus 2 by RT PCR NEGATIVE NEGATIVE Final   Influenza A by PCR NEGATIVE NEGATIVE Final   Influenza B by PCR NEGATIVE NEGATIVE Final    Comment: (NOTE) The Xpert Xpress SARS-CoV-2/FLU/RSV plus assay is intended as an aid in the diagnosis of influenza from Nasopharyngeal swab specimens and should not be used as a sole basis for treatment. Nasal washings and aspirates are unacceptable for Xpert Xpress SARS-CoV-2/FLU/RSV testing.  Fact Sheet for Patients: bloggercourse.com  Fact Sheet for Healthcare Providers: seriousbroker.it  This test is not yet approved or cleared by the [place]  FDA and has been authorized for detection and/or diagnosis of SARS-CoV-2 by FDA under an Emergency Use Authorization (EUA). This EUA will remain in effect (meaning this test can be used) for the duration of  the COVID-19 declaration under Section 564(b)(1) of the Act, 21 U.S.C. section 360bbb-3(b)(1), unless the authorization is terminated or revoked.     Resp Syncytial Virus by PCR NEGATIVE NEGATIVE Final    Comment: (NOTE) Fact Sheet for  Patients: bloggercourse.com  Fact Sheet for Healthcare Providers: seriousbroker.it  This test is not yet approved or cleared by the [place]  FDA and has been authorized for detection and/or diagnosis of SARS-CoV-2 by FDA under an Emergency Use Authorization (EUA). This EUA will remain in effect (meaning this test can be used) for the duration of the COVID-19 declaration under Section 564(b)(1) of the Act, 21 U.S.C. section 360bbb-3(b)(1), unless the authorization is terminated or revoked.  Performed at [facility], [address], [place], [place] [address]   Culture, blood (Routine X 2) w Reflex to ID Panel     Status: None (Preliminary result)   Collection Time: 06/09/24  5:36 AM   Specimen: BLOOD  Result Value Ref Range Status   Specimen Description BLOOD SITE NOT SPECIFIED  Final   Special Requests   Final    BOTTLES DRAWN AEROBIC AND ANAEROBIC Blood Culture results may not be optimal due to an inadequate volume of blood received in culture bottles   Culture   Final    NO GROWTH 4 DAYS Performed at [facility], [address] [place], [place] [address]    Report Status PENDING  Incomplete  Culture, blood (Routine X 2) w Reflex to ID Panel     Status: None (Preliminary result)   Collection Time: 06/09/24  5:36 AM   Specimen: BLOOD  Result Value Ref Range Status   Specimen Description BLOOD SITE NOT SPECIFIED  Final   Special Requests   Final    BOTTLES DRAWN AEROBIC AND ANAEROBIC Blood Culture adequate volume   Culture   Final    NO GROWTH 4 DAYS Performed at [facility], [address] [place], [place] [address]    Report Status PENDING  Incomplete     Radiology Studies: No results found.  Nilda Fendt, MD, PhD [facility]  Between 7 am - 7 pm I am available, please contact me via Amion (for emergencies) or Securechat (non urgent messages)  Between 7 pm - 7 am I am not  available, please contact night coverage MD/APP via Amion  "
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
## 2024-06-13 NOTE — Progress Notes (Signed)
 Message to Dr. Trixie at approx 1130; pt with increased work of breathing, elevated heart rate.   HR 140's, RR 40-50's, 02 increased to 6L [place], rhonchi throughout.  Pt is responding, answering questions at this time.  MD ordered MS04 x1 and nebs.

## 2024-06-13 NOTE — Progress Notes (Addendum)
 PT Cancellation Note  Patient Details Name: Patrick [name] MRN: [id number] DOB: [birth year]   Cancelled Treatment:    Reason Eval/Treat Not Completed: Medical issues which prohibited therapy (Per RN, pt not medically appropriate for therapy at this time. High resting HR and RR. Will follow up later if time allows.)   Yash Cacciola 06/13/2024, 10:10 AM

## 2024-06-13 NOTE — Progress Notes (Signed)
 Spoke with phleb, they will be drawing the vanco lab shortly

## 2024-06-14 ENCOUNTER — Inpatient Hospital Stay (HOSPITAL_COMMUNITY)

## 2024-06-14 DIAGNOSIS — I351 Nonrheumatic aortic (valve) insufficiency: Secondary | ICD-10-CM | POA: Diagnosis not present

## 2024-06-14 DIAGNOSIS — A539 Syphilis, unspecified: Secondary | ICD-10-CM

## 2024-06-14 DIAGNOSIS — R6521 Severe sepsis with septic shock: Secondary | ICD-10-CM | POA: Diagnosis not present

## 2024-06-14 DIAGNOSIS — A4102 Sepsis due to Methicillin resistant Staphylococcus aureus: Secondary | ICD-10-CM | POA: Diagnosis not present

## 2024-06-14 DIAGNOSIS — I6389 Other cerebral infarction: Secondary | ICD-10-CM

## 2024-06-14 DIAGNOSIS — I330 Acute and subacute infective endocarditis: Secondary | ICD-10-CM | POA: Diagnosis not present

## 2024-06-14 DIAGNOSIS — I361 Nonrheumatic tricuspid (valve) insufficiency: Secondary | ICD-10-CM | POA: Diagnosis not present

## 2024-06-14 DIAGNOSIS — R7989 Other specified abnormal findings of blood chemistry: Secondary | ICD-10-CM | POA: Diagnosis not present

## 2024-06-14 DIAGNOSIS — J15212 Pneumonia due to Methicillin resistant Staphylococcus aureus: Secondary | ICD-10-CM | POA: Diagnosis not present

## 2024-06-14 DIAGNOSIS — I639 Cerebral infarction, unspecified: Secondary | ICD-10-CM | POA: Diagnosis not present

## 2024-06-14 DIAGNOSIS — B9562 Methicillin resistant Staphylococcus aureus infection as the cause of diseases classified elsewhere: Secondary | ICD-10-CM | POA: Diagnosis not present

## 2024-06-14 DIAGNOSIS — I340 Nonrheumatic mitral (valve) insufficiency: Secondary | ICD-10-CM

## 2024-06-14 DIAGNOSIS — R7881 Bacteremia: Secondary | ICD-10-CM | POA: Diagnosis not present

## 2024-06-14 DIAGNOSIS — G9340 Encephalopathy, unspecified: Secondary | ICD-10-CM | POA: Diagnosis not present

## 2024-06-14 LAB — PHOSPHORUS: Phosphorus: 3.7 mg/dL (ref 2.5–4.6)

## 2024-06-14 LAB — POCT I-STAT 7, (LYTES, BLD GAS, ICA,H+H)
Acid-base deficit: 6 mmol/L — ABNORMAL HIGH (ref 0.0–2.0)
Bicarbonate: 17.6 mmol/L — ABNORMAL LOW (ref 20.0–28.0)
Calcium, Ion: 1.19 mmol/L (ref 1.15–1.40)
HCT: 33 % — ABNORMAL LOW (ref 39.0–52.0)
Hemoglobin: 11.2 g/dL — ABNORMAL LOW (ref 13.0–17.0)
O2 Saturation: 96 %
Patient temperature: 102.2
Potassium: 4.6 mmol/L (ref 3.5–5.1)
Sodium: 139 mmol/L (ref 135–145)
TCO2: 19 mmol/L — ABNORMAL LOW (ref 22–32)
pCO2 arterial: 32.3 mmHg (ref 32–48)
pH, Arterial: 7.354 (ref 7.35–7.45)
pO2, Arterial: 95 mmHg (ref 83–108)

## 2024-06-14 LAB — COMPREHENSIVE METABOLIC PANEL WITH GFR
ALT: 44 U/L (ref 0–44)
AST: 45 U/L — ABNORMAL HIGH (ref 15–41)
Albumin: 2.7 g/dL — ABNORMAL LOW (ref 3.5–5.0)
Alkaline Phosphatase: 99 U/L (ref 38–126)
Anion gap: 12 (ref 5–15)
BUN: 45 mg/dL — ABNORMAL HIGH (ref 6–20)
CO2: 18 mmol/L — ABNORMAL LOW (ref 22–32)
Calcium: 7.8 mg/dL — ABNORMAL LOW (ref 8.9–10.3)
Chloride: 106 mmol/L (ref 98–111)
Creatinine, Ser: 1.32 mg/dL — ABNORMAL HIGH (ref 0.61–1.24)
GFR, Estimated: >60 mL/min
Glucose, Bld: 145 mg/dL — ABNORMAL HIGH (ref 70–99)
Potassium: 3.9 mmol/L (ref 3.5–5.1)
Sodium: 136 mmol/L (ref 135–145)
Total Bilirubin: 0.9 mg/dL (ref 0.0–1.2)
Total Protein: 7.4 g/dL (ref 6.5–8.1)

## 2024-06-14 LAB — GLUCOSE, CAPILLARY
Glucose-Capillary: 169 mg/dL — ABNORMAL HIGH (ref 70–99)
Glucose-Capillary: 128 mg/dL — ABNORMAL HIGH (ref 70–99)
Glucose-Capillary: 133 mg/dL — ABNORMAL HIGH (ref 70–99)
Glucose-Capillary: 148 mg/dL — ABNORMAL HIGH (ref 70–99)
Glucose-Capillary: 120 mg/dL — ABNORMAL HIGH (ref 70–99)
Glucose-Capillary: 126 mg/dL — ABNORMAL HIGH (ref 70–99)

## 2024-06-14 LAB — CBC
HCT: 32.1 % — ABNORMAL LOW (ref 39.0–52.0)
Hemoglobin: 10.7 g/dL — ABNORMAL LOW (ref 13.0–17.0)
MCH: 29.8 pg (ref 26.0–34.0)
MCHC: 33.3 g/dL (ref 30.0–36.0)
MCV: 89.4 fL (ref 80.0–100.0)
Platelets: 248 K/uL (ref 150–400)
RBC: 3.59 MIL/uL — ABNORMAL LOW (ref 4.22–5.81)
RDW: 14.9 % (ref 11.5–15.5)
WBC: 45 K/uL — ABNORMAL HIGH (ref 4.0–10.5)
nRBC: 0 % (ref 0.0–0.2)

## 2024-06-14 LAB — CULTURE, BLOOD (ROUTINE X 2)
Culture: NO GROWTH
Culture: NO GROWTH
Special Requests: ADEQUATE

## 2024-06-14 LAB — ECHO TEE

## 2024-06-14 LAB — HEPATITIS B SURFACE ANTIBODY,QUALITATIVE: Hep B S Ab: NONREACTIVE

## 2024-06-14 LAB — TRIGLYCERIDES: Triglycerides: 175 mg/dL — ABNORMAL HIGH

## 2024-06-14 LAB — HEPATITIS B SURFACE ANTIBODY, QUANTITATIVE: Hep B S AB Quant (Post): <3.5 m[IU]/mL — ABNORMAL LOW

## 2024-06-14 LAB — MAGNESIUM: Magnesium: 2.4 mg/dL (ref 1.7–2.4)

## 2024-06-14 LAB — VANCOMYCIN, RANDOM: Vancomycin Rm: 34 ug/mL

## 2024-06-14 MED ORDER — OSMOLITE 1.5 CAL PO LIQD
1000.0000 mL | ORAL | Status: DC
  Administered 2024-06-14 – 2024-06-18 (×5): 1000 mL
  Filled 2024-06-14 (×7): qty 1000

## 2024-06-14 MED ORDER — ENOXAPARIN SODIUM 40 MG/0.4ML IJ SOSY
40.0000 mg | PREFILLED_SYRINGE | INTRAMUSCULAR | Status: DC
  Administered 2024-06-14 – 2024-06-17 (×4): 40 mg via SUBCUTANEOUS
  Filled 2024-06-14 (×4): qty 0.4

## 2024-06-14 MED ORDER — SODIUM CHLORIDE 0.9% FLUSH: 10.0000 mL | INTRAVENOUS | Status: DC | PRN

## 2024-06-14 MED ORDER — ORAL CARE MOUTH RINSE
15.0000 mL | OROMUCOSAL | Status: DC
  Administered 2024-06-14 – 2024-06-18 (×46): 15 mL via OROMUCOSAL

## 2024-06-14 MED ORDER — IBUPROFEN 100 MG/5ML PO SUSP
600.0000 mg | Freq: Once | ORAL | Status: AC
  Administered 2024-06-14: 600 mg
  Filled 2024-06-14: qty 30

## 2024-06-14 MED ORDER — ORAL CARE MOUTH RINSE: 15.0000 mL | OROMUCOSAL | Status: DC | PRN

## 2024-06-14 MED ORDER — SODIUM CHLORIDE 0.9% FLUSH
10.0000 mL | Freq: Two times a day (BID) | INTRAVENOUS | Status: DC
  Administered 2024-06-14 (×2): 10 mL
  Administered 2024-06-15 (×2): 20 mL
  Administered 2024-06-16 – 2024-06-17 (×2): 10 mL

## 2024-06-14 MED ORDER — PROSOURCE TF20 ENFIT COMPATIBL EN LIQD
60.0000 mL | Freq: Every day | ENTERAL | Status: DC
  Administered 2024-06-14 – 2024-06-17 (×4): 60 mL
  Filled 2024-06-14 (×4): qty 60

## 2024-06-14 MED ORDER — CHLORHEXIDINE GLUCONATE CLOTH 2 % EX PADS
6.0000 | MEDICATED_PAD | Freq: Every day | CUTANEOUS | Status: DC
  Administered 2024-06-14 – 2024-06-17 (×4): 6 via TOPICAL

## 2024-06-14 MED ORDER — PANTOPRAZOLE SODIUM 40 MG IV SOLR
40.0000 mg | INTRAVENOUS | Status: DC
  Administered 2024-06-14 – 2024-06-17 (×4): 40 mg via INTRAVENOUS
  Filled 2024-06-14 (×4): qty 10

## 2024-06-14 MED ORDER — SODIUM CHLORIDE 0.9 % IV SOLN: INTRAVENOUS | Status: AC

## 2024-06-14 MED ORDER — THIAMINE MONONITRATE 100 MG PO TABS
100.0000 mg | ORAL_TABLET | Freq: Every day | ORAL | Status: DC
  Administered 2024-06-14 – 2024-06-17 (×4): 100 mg
  Filled 2024-06-14 (×4): qty 1

## 2024-06-14 MED ORDER — VANCOMYCIN HCL IN DEXTROSE 1-5 GM/200ML-% IV SOLN
1000.0000 mg | Freq: Two times a day (BID) | INTRAVENOUS | Status: DC
  Administered 2024-06-14 – 2024-06-15 (×2): 1000 mg via INTRAVENOUS
  Filled 2024-06-14 (×2): qty 200

## 2024-06-14 NOTE — Progress Notes (Signed)
 SLP Cancellation Note  Patient Details Name: Patrick [name] MRN: [id number] DOB: 1981/02/10   Cancelled treatment:       Reason Eval/Treat Not Completed: Medical issues which prohibited therapy (transferred to ICU and intubated). Will continue following for readiness to resume dysphagia and cognitive-linguistic interventions.    [name], M.A., CCC-SLP Speech Language Pathology, Acute Rehabilitation Services  Secure Chat preferred [phone]  06/14/2024, 9:00 AM

## 2024-06-14 NOTE — Progress Notes (Addendum)
 "  NAME:  Patrick [name], MRN:  [id number], DOB:  May 23, 1981, LOS: 6 ADMISSION DATE:  06/07/2024 CONSULTATION DATE:  06/13/2024 REFERRING MD:  Vianne - TRH, CHIEF COMPLAINT:  Acute respiratory failure   History of Present Illness:  44 year old man who presented to [facility] 12/30 as a Code Stroke with L facial droop, L arm weakness and slurred speech. PMHx significant for arthritis, polysubstance abuse including IVDU.  On ED arrival, patient was altered with LKW ~0100 12/30. Unresponsive requiring BVM ventilations. Responsiveness improved with Narcan  administration, but ongoing facial asymmetry was noted. Labs were notable for WBC 20.1, Hgb 15.1, Plt 108. INR 1.3. Na 128, K 4.5, CO2 25, BUN/Cr 36/1.00. AST/ALT 144/71, Alk Phos 181, Tbili 0.9. Ethanol negative, ammonia 28. UDS +amphetamines, fentanyl . VBG pH 7.474/pCO2 36.6/bicarb 26.9. LA 2.5. PCT 3.05 BCID +MRSA. COVID/Flu/RSV negative. CT Head 12/30 showed intermediate-sized area of hypoattenuation within the R MCA territory c/w acute ischemic changes. CTA Head/Neck 12/31 negative for LVO, +atherosclerosis. CT Chest/A/P 12/31 showed multifocal PNA, diffuse bronchial wall thickening with moderate mucous impaction RLL, hepatosplenomegaly, R kidney atrophy, diverticulosis. MRI Brain demonstrated acute/subacute infarction at the R frontoparietal junction (R MCA territory) with mild HT, small areas of additional infarct/microhemorrhage. Ultimately admitted to [facility] service. TTE with concern for AV vegetation; ID was consulted for possible IE.  Cards was consulted for TEE.  On 1/5, Rapid Response was called for respiratory distress with tachypnea to 40s-50s, tachycardia to 120s, and SpO2 89% with increased WOB. PCCM was consulted and patient was transferred to ICU. Intubated on arrival to ICU.  Pertinent Medical History:   Past Medical History:  Diagnosis Date   Arthritis    IVDU (intravenous drug use)    MRSA bacteremia    Polysubstance abuse (HCC)     Significant Hospital Events: Including procedures, antibiotic start and stop dates in addition to other pertinent events   12/30 - Presented to ED as Code Stroke. 12/31 - TTE c/f AV vegetation/infective endocarditis, ID consulted, Cards consulted for TEE. 1/5 - Rapid Response for tachypnea, tachycardia, increased WOB. PCCM consulted. Transferred to ICU. Intubated. Levophed  initiated.   Interim History / Subjective:   Intubated, on MV Placed on propofol  and dilaudid  drip due to vent dyssynchrony   Plans for TEE Remains febrile  Objective:  Blood pressure (!) 119/51, pulse 90, temperature (!) 100.5 F (38.1 C), temperature source Axillary, resp. rate 19, height 5' 11 (1.803 m), weight 71.7 kg, SpO2 98%.    Vent Mode: PSV;CPAP FiO2 (%):  [50 %-100 %] 50 % Set Rate:  [18 bmp-28 bmp] 28 bmp Vt Set:  [600 mL] 600 mL PEEP:  [5 cmH20-8 cmH20] 8 cmH20 Pressure Support:  [10 cmH20] 10 cmH20 Plateau Pressure:  [10 cmH20-26 cmH20] 10 cmH20   Intake/Output Summary (Last 24 hours) at 06/14/2024 0944 Last data filed at 06/14/2024 0900 Gross per 24 hour  Intake 4000.81 ml  Output 1500 ml  Net 2500.81 ml   Filed Weights   06/07/24 2338 06/09/24 0444 06/10/24 0417  Weight: 79.2 kg 74.3 kg 71.7 kg    Physical Examination:  General: Acute-on-chronically ill-appearing middle-aged man in NAD. HEENT: [place]/AT, anicteric sclera, PERRL 2mm, moist mucous membranes. Neuro: Intubated, sedated. Does not respond to verbal, tactile or noxious stimuli. Not following commands. +Corneal, +Cough, and +Gag  CV: Tachycardic to 130s, regular rhythm, ?systolic murmur PULM: Breathing tachypneic to 30s and mildly labored on vent (PEEP 5, FiO2 50%). Lung fields coarse throughout, most notably over bilateral bases. GI: Soft, nontender, nondistended.  Normoactive bowel sounds. Extremities: No LE edema noted. Skin: Warm/dry, +track marks.  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 ABG rechecked - s/p TEE today- large AV vegetations and new severe AR- consult cardiothoracic surgery- appreciate cardiology assistance - continue levophed  and vasopressin  to keep MAP >65 - plans for CT head/abd/pelvis as per ID. 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 - CT head, abd/pelvis as per ID recommendations     Start PPI Will resume DVT ppx  (>1 week since stroke now)   Labs:  CBC: Recent Labs  Lab 06/07/24 2331 06/07/24 2334 06/10/24 0148 06/11/24 0543 06/12/24 0616 06/13/24 0223 06/13/24 1547 06/13/24 1955 06/14/24 0433  WBC 20.1*  --  23.1* 25.0* 29.0* 26.4*  --   --  45.0*  NEUTROABS 16.9*  --   --   --   --   --   --   --   --   HGB 15.1   < > 13.1 12.0* 11.7* 11.4* 12.2* 11.2* 10.7*  HCT 43.4   < > 36.9* 33.5* 34.0* 34.4* 36.0* 33.0* 32.1*  MCV 84.4  --  82.2 81.9 84.2 88.4  --   --  89.4  PLT 108*  --  145* 180 187 137*  --   --  248   < > = values in this interval not displayed.   Basic Metabolic Panel: Recent Labs  Lab 06/10/24 0148 06/11/24 0543 06/12/24 9383 06/13/24 0223 06/13/24 1547 06/13/24 1955 06/14/24 0433  NA 135 138 136 137 143 138 136  K  3.5 3.5 3.9 4.0 4.7 4.4 3.9  CL 101 104 107 108  --   --  106  CO2 24 23 19* 16*  --   --  18*  GLUCOSE 131* 159* 139* 127*  --   --  145*  BUN 23* 21* 26* 29*  --   --  45*  CREATININE 0.72 0.84 0.80 0.74  --   --  1.32*  CALCIUM 8.2* 7.7* 7.8* 7.8*  --   --  7.8*  MG 2.0 2.1 2.3 2.3  --   --  2.4  PHOS  --   --  3.5 3.6  --   --   --    GFR: Estimated Creatinine Clearance: 73.9 mL/min (A) (by C-G formula based on SCr of 1.32 mg/dL (H)). Recent Labs  Lab 06/08/24 0123 06/08/24 9662 06/08/24 0513 06/08/24 0828 06/10/24 0148 06/11/24 0543 06/12/24 0616 06/13/24 0223 06/14/24 0433  PROCALCITON  --   --  3.05  --   --   --   --   --   --   WBC  --   --   --   --    < > 25.0* 29.0* 26.4* 45.0*  LATICACIDVEN 2.5* 2.6*  --  1.5  --   --   --   --   --    < > = values in this interval not displayed.   Liver Function Tests: Recent Labs  Lab 06/10/24 0148 06/11/24 0543 06/12/24 0616 06/13/24 0223 06/14/24 0433  AST 101* 66* 58* 68* 45*  ALT 68* 61* 44 52* 44  ALKPHOS 208* 172* 148* 118 99  BILITOT 1.7* 1.6* 1.4* 1.1 0.9  PROT 6.8 7.4 7.8 7.8 7.4  ALBUMIN 2.7* 2.8* 2.7* 2.7* 2.7*   No results for input(s): LIPASE, AMYLASE in the last 168 hours. Recent Labs  Lab 06/07/24 2331  AMMONIA 28   ABG:    Component Value Date/Time   PHART 7.433 06/13/2024 1955   PCO2ART 24.3 (L) 06/13/2024 1955   PO2ART 60 (L) 06/13/2024 1955   HCO3 16.3 (L) 06/13/2024 1955   TCO2 17 (L) 06/13/2024 1955   ACIDBASEDEF 7.0 (H) 06/13/2024 1955   O2SAT 92 06/13/2024 1955    Coagulation Profile: Recent Labs  Lab 06/07/24 2331  INR 1.3*   Cardiac Enzymes: No results for input(s): CKTOTAL, CKMB, CKMBINDEX, TROPONINI in the last 168 hours.  HbA1C: Hgb A1c MFr Bld  Date/Time Value Ref Range Status  06/08/2024 09:03 AM 6.0 (H) 4.8 - 5.6 % Final    Comment:    (NOTE) Diagnosis of Diabetes The following HbA1c ranges recommended by the American Diabetes Association (ADA) may  be used as an aid in the diagnosis of diabetes mellitus.  Hemoglobin             Suggested A1C NGSP%              Diagnosis  <5.7                   Non Diabetic  5.7-6.4                Pre-Diabetic  >6.4                   Diabetic  <7.0                   Glycemic control for                       adults with diabetes.     CBG: Recent Labs  Lab 06/13/24 1543 06/13/24 1929 06/13/24 2323 06/14/24 0324 06/14/24 0803  GLUCAP 135* 149* 149* 169* 128*   Review of Systems:   Patient is encephalopathic and/or intubated; therefore, history has been obtained from chart review.   Past Medical History:  He,  has a past medical history of Arthritis, IVDU (intravenous drug use), MRSA bacteremia, and Polysubstance abuse (HCC).   Surgical History:  No past surgical history on file.   Social History:   reports that he has been smoking. He does not have any smokeless tobacco history on file. He reports current alcohol use. He reports that he does not use drugs.   Family History:  His family history is not on file.   Allergies: Allergies[1]   Home Medications: Prior to Admission medications  Medication Sig Start Date End Date Taking? Authorizing Provider  acetaminophen  (TYLENOL ) 500 MG tablet Take 1,000 mg by mouth every 6 (six) hours as needed for moderate pain.   Yes [provider]  albuterol  (PROVENTIL  HFA;VENTOLIN  HFA) 108 (90 Base) MCG/ACT inhaler Inhale 2 puffs into the lungs every 6 (six) hours as needed  for wheezing or shortness of breath.   Yes [provider]  amoxicillin-clavulanate (AUGMENTIN) 875-125 MG tablet Take 1 tablet by mouth 2 (two) times daily. Patient not taking: Reported on 06/08/2024 06/07/24   [provider]    Critical care time:   The patient is critically ill with multiple organ system failure and requires high complexity decision making for assessment and support, frequent evaluation and titration of therapies, advanced  monitoring, review of radiographic studies and interpretation of complex data.   Critical Care Time devoted to patient care services, exclusive of separately billable procedures, described in this note is 30 minutes.  Latresa Gasser Pleas, MD [facility] 06/14/2024 9:44 AM  Please see [email or web address] for pager details.  From 7A-7P if no response, please call [phone] After hours, please call ELink [phone]      [1] No Known Allergies  "

## 2024-06-14 NOTE — Progress Notes (Signed)
 PT Cancellation Note  Patient Details Name: Patrick [name] MRN: [id number] DOB: 28-Jun-1980   Cancelled Treatment:    Reason Eval/Treat Not Completed: Patient not medically ready  Previously followed by our service. Patient was transferred to ICU and is now intubated. Please re-order physical therapy when patient medically ready for re-evaluation and continued rehab efforts.  Thank you,  Leontine Roads, PT, DPT [facility]  Rehabilitation Services Physical Therapist Office: [phone] Website: [email or web address]  Leontine GORMAN Roads 06/14/2024, 10:07 AM

## 2024-06-14 NOTE — Progress Notes (Signed)
 Initial Nutrition Assessment  DOCUMENTATION CODES:  Not applicable  INTERVENTION:  Initiate tube feeding via OGT: Osmolite 1.5 at 65 ml/h (1560 ml per day) Start at 25 and advance by 10mL every 12 hours to reach goal Prosource TF20 60ml 1x/d Provides 2340 kcal, 118 gm protein, 1189 ml free water daily Pt is at risk for refeeding syndrome given polysubstance abuse. Monitor magnesium and phosphorus daily x 3 days, MD to replete as needed. 100mg  thiamine  x 5 days   NUTRITION DIAGNOSIS:  Inadequate oral intake related to inability to eat as evidenced by NPO status.  GOAL:  Patient will meet greater than or equal to 90% of their needs  MONITOR:  TF tolerance, I & O's, Vent status  REASON FOR ASSESSMENT:  Ventilator, Consult Enteral/tube feeding initiation and management  ASSESSMENT:  Pt with hx of polysubstance abuse presented to ED with AMS and left sided deficits. Imaging an ED showed complete R MCA distribution stroke. No interventions offered as pt outside of window. UDS positive for amphetamines and fentanyl  on admission.  12/30 - presented to ED as code stroke 12/31 and 1/1 - SLP BSE, NPO recommended 1/2 - MBS, DYS1/thin 1/5 - rapid response, transferred to ICU intubated   Patient is currently intubated on ventilator support. Planned for TEE this afternoon. Discussed in rounds with MD, requests TF to be initiated after procedure is complete and to place cortrak 1/7 as pt will need prolonged nutrition and hospitalization. Will follow-up in person for physical exam and nutrition hx.   Pt currently requiring pressor support and with hx of drug abuse. Will initiate and advance TF slowly to monitor for refeeding syndrome and to allow for hemodynamic stability. Noted output from OGT yesterday, none recorded so far today.  MV: 18.3 L/min Temp (24hrs), Avg:102.1 F (38.9 C), Min:100.5 F (38.1 C), Max:103.3 F (39.6 C) MAP (cuff): 67-47mmHg  Propofol : 6.45 ml/hr  Admit  weight: 79.2 kg   Current weight: 71.7 kg    Intake/Output Summary (Last 24 hours) at 06/14/2024 1645 Last data filed at 06/14/2024 1500 Gross per 24 hour  Intake 4842.81 ml  Output 2500 ml  Net 2342.81 ml  Net IO Since Admission: -5,440 mL [06/14/24 1645]  Drains/Lines: CVC left IJ OGT 16 Fr. (Proximal stomach) out x 24 hours UOP x 24 hours  Average Meal Intake: 1/2-1/5: 0% intake x 6 recorded meals  Nutritionally Relevant Medications: Scheduled Meds:  docusate  100 mg Per Tube BID   polyethylene glycol  17 g Per Tube Daily   Continuous Infusions:  levofloxacin  (LEVAQUIN ) IV Stopped (06/13/24 1457)   norepinephrine  (LEVOPHED ) Adult infusion 18 mcg/min (06/14/24 0900)   penicillin  G potassium 24 Million Units in dextrose  5 % 500 mL CONTINUOUS infusion 20.8 mL/hr at 06/14/24 0900   propofol  (DIPRIVAN ) infusion 15 mcg/kg/min (06/14/24 0900)   vancomycin  Stopped (06/14/24 0321)   vasopressin  0.03 Units/min (06/14/24 0900)   Labs Reviewed: BUN 45, creatinine 1.32 CBG ranges from 109-169 mg/dL over the last 24 hours HgbA1c 6.0% (12/31)  NUTRITION - FOCUSED PHYSICAL EXAM: Defer to in-person assessment  Diet Order:   Diet Order             Diet NPO time specified  Diet effective now                   EDUCATION NEEDS:  Not appropriate for education at this time  Skin:  Skin Assessment: Reviewed RN Assessment  Last BM:  1/5 - type  6  Height:  Ht Readings from Last 1 Encounters:  06/07/24 5' 11 (1.803 m)    Weight:  Wt Readings from Last 1 Encounters:  06/10/24 71.7 kg    Ideal Body Weight:  78.2 kg  BMI:  Body mass index is 22.05 kg/m.  Estimated Nutritional Needs:  Kcal:  2300-2500 kcal/d Protein:  110-125g/d Fluid:  2.5L/d    Vernell Lukes, RD, LDN, CNSC Registered Dietitian II Please reach out via secure chat

## 2024-06-14 NOTE — Progress Notes (Signed)
" ° °  [facility] has been requested to perform a transesophageal echocardiogram on Patrick [name] for endocarditis.     The patient does NOT have any absolute or relative contraindications to a Transesophageal Echocardiogram (TEE).  The patient has: Current Oxygen Requirement Patient is currently intubated  A mass was previously seen on the aortic valve.  After careful review of history and examination, the risks and benefits of transesophageal echocardiogram have been explained including risks of esophageal damage, perforation (1:10,000 risk), bleeding, pharyngeal hematoma as well as other potential complications associated with conscious sedation including aspiration, arrhythmia, respiratory failure and death. Alternatives to treatment were discussed, questions were answered. Patients family (Brother Patrick [name] and daughter Patrick [name]) are willing to proceed. The patient is not married.   Bonney Morse Clause, PA-C  06/14/2024 7:47 AM   "

## 2024-06-14 NOTE — Progress Notes (Signed)
 Pharmacy Antibiotic Note  Patrick [name] is a 44 y.o. 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  Pharmacy has been consulted for vancomycin  dosing.   Vancomycin  kinetic parameters:  1/5: ke = 0.123, half life ~5.6 hours, Vd ~51.6 L 1/6: ke ~0.075   Vancomycin  levels: 06/13/2024 vancomycin  trough (collected at 0834) = 10 mcg/mL 06/14/2024 vancomycin  random (collected at 0838) = 34 mcg/mL  Vancomycin  random level was obtained this morning due to increased renal markers (Scr 1.32). Given elevated vancomycin  random level of 34 mcg/mL, will adjust dosing regimen to vancomycin  1000 mg every 12 hours and monitor renal function.   Plan: Adjust to vancomycin  1000 mg every 12 hours Goal trough of 15-20 mcg/mL Trend WBC, temp, renal function, clinical progress Obtain vancomycin  levels as needed   Height: 5' 11 (180.3 cm) Weight: 71.7 kg (158 lb 1.1 oz) IBW/kg (Calculated) : 75.3  Temp (24hrs), Avg:102.3 F (39.1 C), Min:100.5 F (38.1 C), Max:103.3 F (39.6 C)  Recent Labs  Lab 06/08/24 0123 06/08/24 0337 06/08/24 0828 06/10/24 0148 06/10/24 0958 06/11/24 0543 06/12/24 0616 06/13/24 0200 06/13/24 0223 06/13/24 0834 06/14/24 0433 06/14/24 0838  WBC  --   --   --  23.1*  --  25.0* 29.0*  --  26.4*  --  45.0*  --   CREATININE  --   --   --  0.72  --  0.84 0.80  --  0.74  --  1.32*  --   LATICACIDVEN 2.5* 2.6* 1.5  --   --   --   --   --   --   --   --   --   VANCOTROUGH  --   --   --   --    < >  --   --   --  DISREGARD TEST RESULTS, TEST WILL BE CREDITED. TEST DRAWN BEFORE REQUESTED TIME. 10*  --   --   VANCOPEAK  --   --   --  21*  --   --   --  42*  --   --   --   --   VANCORANDOM  --   --   --   --   --   --   --   --   --   --   --  34   < > = values in this interval not displayed.    Estimated Creatinine Clearance: 73.9 mL/min (A) (by C-G formula based on SCr of 1.32 mg/dL (H)).    Allergies[1]  Medications  this admission:  Cefepime  12/31 x1 dose Vanc 12/31 >> Penicillin  G 1/2 >> Levofloxacin  1/5 >> (1/9)   Microbiology: 12/31 bcx: 4/4 MRSA 12/31 RVP: neg 1/1 BCx: ngtd x4 days HIV NR  Thank you for involving pharmacy in this patient's care.  Feliciano Close, PharmD PGY2 Infectious Diseases Pharmacy Resident     [1] No Known Allergies

## 2024-06-14 NOTE — TOC Progression Note (Signed)
 Transition of Care [facility]) - Progression Note    Patient Details  Name: Patrick [name] MRN: [id number] Date of Birth: 1981/03/10  [facility] CM/SW Contact  Bridget Cordella Simmonds, LCSW Phone Number: 06/14/2024, 1:26 PM  Clinical Narrative:   ICM continues to follow for DC needs.     Expected Discharge Plan: Skilled Nursing Facility Barriers to Discharge: Continued Medical Work up, English As A Second Language Teacher, Inadequate or no insurance, Active Substance Use - Placement, Requiring sitter/restraints, Facility will not accept until restraint criteria met               Expected Discharge Plan and Services     Post Acute Care Choice: Skilled Nursing Facility Living arrangements for the past 2 months: Mobile Home                                       Social Drivers of Health (SDOH) Interventions SDOH Screenings   Food Insecurity: Patient Unable To Answer (06/08/2024)  Housing: Unknown (06/08/2024)  Transportation Needs: Patient Unable To Answer (06/08/2024)  Utilities: Patient Unable To Answer (06/08/2024)  Tobacco Use: High Risk (06/13/2024)    Readmission Risk Interventions     No data to display

## 2024-06-14 NOTE — CV Procedure (Signed)
" ° °  TRANSESOPHAGEAL ECHOCARDIOGRAM  NAME:  Patrick [name]    MRN: [id number] DOB:  [birth year]    ADMIT DATE: 06/07/2024  INDICATIONS: Concern for endocarditis  PROCEDURE:   Informed consent was obtained prior to the procedure. The risks, benefits and alternatives for the procedure were discussed and the patient comprehended these risks.  Risks include, but are not limited to, cough, sore throat, vomiting, nausea, somnolence, esophageal and stomach trauma or perforation, bleeding, low blood pressure, aspiration, pneumonia, infection, trauma to the teeth and death.    After a procedural timeout, the patient had their propofol  drip increased by nursing per request. The patient's heart rate, blood pressure, and oxygen saturation were monitored continuously during the procedure. The period of conscious sedation was 15 minutes, of which I was present face-to-face 100% of this time.  The transesophageal probe was inserted in the esophagus and stomach without difficulty and multiple views were obtained.  The patient was kept under observation until the patient left the procedure room.  The patient left the procedure room in stable condition.    COMPLICATIONS:    Complications: No complications.  The patient had normal neuro status and respiratory status post procedure with vitals stable as recorded elsewhere.  Adequate airway was maintained throughout and vital signs monitored per protocol.  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   Morene Brownie Advanced Heart Failure 12:43 PM     "

## 2024-06-14 NOTE — Progress Notes (Signed)
 "   [facility]  Date of Admission:  06/07/2024     Reason for Follow Up: Acute encephalopathy  Total days of antibiotics 8         ASSESSMENT:  [name] is a 44 y/o caucasian male with history of injection drug use admitted with stroke like symptoms and found to have multifocal stroke, MRSA bacteremia, MRSA pneumonia, and aortic valve endocarditis. Also with positive syphilis titer and with concern for neurosyphilis plan for treatment with Penicillin  G. Course complicated on 06/13/24 with increasing respiratory distress requiring mechanical ventilation.   Blood cultures from 06/09/24 have finalized without growth and new blood cultures obtained today are pending. TEE completed showing severe aortic regurgitation and aortic valve endocarditis. Recommend CVTS evaluation for any potential surgical intervention. Although fevers are likely related to continued burden of infection may be beneficial to image chest/abdomen/pelvis for any additional signs of infection. Continue current dose of vancomycin  for MRSA infection. Renal function slightly increased and will continue to monitor along with vancomycin  levels, but for now safe to continue with vancomycin . Continue current dose of levofloxacin  for aspiration/gram negative coverage and Penicillin  G for neurosyphilis treatment. Given extent of infection prognosis appears to be poor at the present time. Continue contact precautions for MRSA. Mechanical ventilation and remaining medical and supportive care per PCCM.   PLAN:  Continue current dose of vancomycin  for disseminated MRSA infection Continue Penicillin  G for neurosyphilis Continue levofloxacin  for aspiration and gram negative coverage. Therapeutic drug monitoring of renal function and vancomycin  levels.  Monitor blood cultures for any new bacteremia. Consider imaging of head, chest, abdomen, pelvis for further evaluation of infection. CVTS evaluation for any surgical  interventions or if a candidate for.  Contact precautions for MRSA Mechanical ventilation and remaining medical and supportive care per PCCM.   Principal Problem:   Acute encephalopathy Active Problems:   MRSA bacteremia   Neurosyphilis   Acute bacterial endocarditis   Acute ischemic stroke (HCC)   Sepsis (HCC)   Acute respiratory failure with hypoxia (HCC)   Pneumonia of both lungs due to infectious organism   Opiate overdose (HCC)    Chlorhexidine  Gluconate Cloth  6 each Topical Daily   docusate  100 mg Per Tube BID   enoxaparin  (LOVENOX ) injection  40 mg Subcutaneous Q24H   etomidate   20 mg Intravenous Once   feeding supplement (PROSource TF20)  60 mL Per Tube Daily    morphine  injection  1 mg Intravenous Once   mouth rinse  15 mL Mouth Rinse Q2H   pantoprazole  (PROTONIX ) IV  40 mg Intravenous Q24H   polyethylene glycol  17 g Per Tube Daily   sodium chloride  flush  10-40 mL Intracatheter Q12H   sodium chloride  flush  3 mL Intravenous Once   thiamine   100 mg Per Tube Daily    SUBJECTIVE:  Febrile overnight with increasing leukocytosis with WBC count of 45,000. Started on levofloxacin  with concern for aspiration and need for gram negative coverage. Remains sedated and intubated. Max temperatures of 103.3 F.   Allergies[1]   Review of Systems: Review of Systems  Unable to perform ROS: Intubated      OBJECTIVE: Vitals:   06/14/24 1200 06/14/24 1216 06/14/24 1230 06/14/24 1300  BP: (!) 134/56 (!) 124/48 (!) 113/46 (!) 110/42  Pulse: 97 95 95 89  Resp: 20 20 20 19   Temp:      TempSrc:      SpO2: 100% 100% 100% 100%  Weight:  Height:       Body mass index is 22.05 kg/m.  Physical Exam Constitutional:      General: He is not in acute distress.    Appearance: He is well-developed. He is ill-appearing.     Interventions: He is sedated and intubated.  Cardiovascular:     Rate and Rhythm: Regular rhythm. Tachycardia present.     Heart sounds: Normal heart  sounds.  Pulmonary:     Effort: Pulmonary effort is normal. He is intubated.     Breath sounds: Normal breath sounds.  Abdominal:     General: Bowel sounds are normal.  Skin:    General: Skin is warm and dry.     Lab Results Lab Results  Component Value Date   WBC 45.0 (H) 06/14/2024   HGB 11.2 (L) 06/14/2024   HCT 33.0 (L) 06/14/2024   MCV 89.4 06/14/2024   PLT 248 06/14/2024    Lab Results  Component Value Date   CREATININE 1.32 (H) 06/14/2024   BUN 45 (H) 06/14/2024   NA 139 06/14/2024   K 4.6 06/14/2024   CL 106 06/14/2024   CO2 18 (L) 06/14/2024    Lab Results  Component Value Date   ALT 44 06/14/2024   AST 45 (H) 06/14/2024   GGT 68 (H) 06/08/2024   ALKPHOS 99 06/14/2024   BILITOT 0.9 06/14/2024     Microbiology: Recent Results (from the past 240 hours)  Blood culture (routine x 2)     Status: Abnormal (Preliminary result)   Collection Time: 06/08/24 12:35 AM   Specimen: BLOOD RIGHT ARM  Result Value Ref Range Status   Specimen Description BLOOD RIGHT ARM  Final   Special Requests   Final    BOTTLES DRAWN AEROBIC AND ANAEROBIC Blood Culture adequate volume   Culture  Setup Time   Final    GRAM POSITIVE COCCI IN CLUSTERS IN BOTH AEROBIC AND ANAEROBIC BOTTLES CRITICAL RESULT CALLED TO, READ BACK BY AND VERIFIED WITH: PHARMD J.FRENS AT 1213 ON 06/08/2024 BY T.SAAD.    Culture (A)  Final    METHICILLIN RESISTANT STAPHYLOCOCCUS AUREUS Sent to [facility] for further susceptibility testing. Performed at [facility], [address] [place], [place] [address]    Report Status PENDING  Incomplete   Organism ID, Bacteria METHICILLIN RESISTANT STAPHYLOCOCCUS AUREUS  Final      Susceptibility   Methicillin resistant staphylococcus aureus - MIC*    CIPROFLOXACIN >=8 RESISTANT Resistant     ERYTHROMYCIN >=8 RESISTANT Resistant     GENTAMICIN <=0.5 SENSITIVE Sensitive     OXACILLIN >=4 RESISTANT Resistant     TETRACYCLINE <=1 SENSITIVE Sensitive      VANCOMYCIN  1 SENSITIVE Sensitive     TRIMETH/SULFA >=320 RESISTANT Resistant     CLINDAMYCIN >=8 RESISTANT Resistant     RIFAMPIN <=0.5 SENSITIVE Sensitive     Inducible Clindamycin NEGATIVE Sensitive     LINEZOLID 2 SENSITIVE Sensitive     * METHICILLIN RESISTANT STAPHYLOCOCCUS AUREUS  Blood Culture ID Panel (Reflexed)     Status: Abnormal   Collection Time: 06/08/24 12:35 AM  Result Value Ref Range Status   Enterococcus faecalis NOT DETECTED NOT DETECTED Final   Enterococcus Faecium NOT DETECTED NOT DETECTED Final   Listeria monocytogenes NOT DETECTED NOT DETECTED Final   Staphylococcus species DETECTED (A) NOT DETECTED Final    Comment: CRITICAL RESULT CALLED TO, READ BACK BY AND VERIFIED WITH: PHARMD J.FRENS AT 1213 ON 06/08/2024 BY T.SAAD.    Staphylococcus  aureus (BCID) DETECTED (A) NOT DETECTED Final    Comment: Methicillin (oxacillin)-resistant Staphylococcus aureus (MRSA). MRSA is predictably resistant to beta-lactam antibiotics (except ceftaroline). Preferred therapy is vancomycin  unless clinically contraindicated. Patient requires contact precautions if  hospitalized. CRITICAL RESULT CALLED TO, READ BACK BY AND VERIFIED WITH: PHARMD J.FRENS AT 1213 ON 06/08/2024 BY T.SAAD.    Staphylococcus epidermidis NOT DETECTED NOT DETECTED Final   Staphylococcus lugdunensis NOT DETECTED NOT DETECTED Final   Streptococcus species NOT DETECTED NOT DETECTED Final   Streptococcus agalactiae NOT DETECTED NOT DETECTED Final   Streptococcus pneumoniae NOT DETECTED NOT DETECTED Final   Streptococcus pyogenes NOT DETECTED NOT DETECTED Final   A.calcoaceticus-baumannii NOT DETECTED NOT DETECTED Final   Bacteroides fragilis NOT DETECTED NOT DETECTED Final   Enterobacterales NOT DETECTED NOT DETECTED Final   Enterobacter cloacae complex NOT DETECTED NOT DETECTED Final   Escherichia coli NOT DETECTED NOT DETECTED Final   Klebsiella aerogenes NOT DETECTED NOT DETECTED Final   Klebsiella oxytoca  NOT DETECTED NOT DETECTED Final   Klebsiella pneumoniae NOT DETECTED NOT DETECTED Final   Proteus species NOT DETECTED NOT DETECTED Final   Salmonella species NOT DETECTED NOT DETECTED Final   Serratia marcescens NOT DETECTED NOT DETECTED Final   Haemophilus influenzae NOT DETECTED NOT DETECTED Final   Neisseria meningitidis NOT DETECTED NOT DETECTED Final   Pseudomonas aeruginosa NOT DETECTED NOT DETECTED Final   Stenotrophomonas maltophilia NOT DETECTED NOT DETECTED Final   Candida albicans NOT DETECTED NOT DETECTED Final   Candida auris NOT DETECTED NOT DETECTED Final   Candida glabrata NOT DETECTED NOT DETECTED Final   Candida krusei NOT DETECTED NOT DETECTED Final   Candida parapsilosis NOT DETECTED NOT DETECTED Final   Candida tropicalis NOT DETECTED NOT DETECTED Final   Cryptococcus neoformans/gattii NOT DETECTED NOT DETECTED Final   Meth resistant mecA/C and MREJ DETECTED (A) NOT DETECTED Final    Comment: CRITICAL RESULT CALLED TO, READ BACK BY AND VERIFIED WITH: PHARMD J.FRENS AT 1213 ON 06/08/2024 BY T.SAAD. Performed at [facility], [address] [place], [place] [address]   MIC (1 Drug)-     Status: Abnormal   Collection Time: 06/08/24 12:35 AM  Result Value Ref Range Status   Min Inhibitory Conc (1 Drug) Final report (A)  Corrected    Comment: (NOTE) Performed At: [facility] [address] [place], [place] [id number] Jennette Shorter MD Ey:[id number] CORRECTED ON 01/05 AT 1636: PREVIOUSLY REPORTED AS Preliminary report    Source [id number] MRSA BLOOD DAPTOMYCIN  Final    Comment: Performed at [facility], [address], [place], [place] [address]  MIC Result     Status: Abnormal   Collection Time: 06/08/24 12:35 AM  Result Value Ref Range Status   Result 1 (MIC) Comment (A)  Final    Comment: (NOTE) Methicillin - resistant Staphylococcus aureus Identification performed by account, not confirmed by this laboratory. DAPTOMYCIN .50ug/ml  SUSCPETIBLE Performed At: [facility] [address] [place], [place] [id number] Jennette Shorter MD Ey:[id number]   Blood culture (routine x 2)     Status: Abnormal   Collection Time: 06/08/24 12:40 AM   Specimen: BLOOD LEFT ARM  Result Value Ref Range Status   Specimen Description BLOOD LEFT ARM  Final   Special Requests   Final    BOTTLES DRAWN AEROBIC AND ANAEROBIC Blood Culture adequate volume   Culture  Setup Time   Final    GRAM POSITIVE COCCI IN CLUSTERS IN BOTH AEROBIC AND  ANAEROBIC BOTTLES CRITICAL VALUE NOTED.  VALUE IS CONSISTENT WITH PREVIOUSLY REPORTED AND CALLED VALUE.    Culture (A)  Final    STAPHYLOCOCCUS AUREUS SUSCEPTIBILITIES PERFORMED ON PREVIOUS CULTURE WITHIN THE LAST 5 DAYS. Performed at [facility], [address], [place], [place] [address]    Report Status 06/10/2024 FINAL  Final  Resp panel by RT-PCR (RSV, Flu A&B, Covid) Anterior Nasal Swab     Status: None   Collection Time: 06/08/24  3:54 AM   Specimen: Anterior Nasal Swab  Result Value Ref Range Status   SARS Coronavirus 2 by RT PCR NEGATIVE NEGATIVE Final   Influenza A by PCR NEGATIVE NEGATIVE Final   Influenza B by PCR NEGATIVE NEGATIVE Final    Comment: (NOTE) The Xpert Xpress SARS-CoV-2/FLU/RSV plus assay is intended as an aid in the diagnosis of influenza from Nasopharyngeal swab specimens and should not be used as a sole basis for treatment. Nasal washings and aspirates are unacceptable for Xpert Xpress SARS-CoV-2/FLU/RSV testing.  Fact Sheet for Patients: bloggercourse.com  Fact Sheet for Healthcare Providers: seriousbroker.it  This test is not yet approved or cleared by the [place]  FDA and has been authorized for detection and/or diagnosis of SARS-CoV-2 by FDA under an Emergency Use Authorization (EUA). This EUA will remain in effect (meaning this test can be used) for the duration of the COVID-19 declaration  under Section 564(b)(1) of the Act, 21 U.S.C. section 360bbb-3(b)(1), unless the authorization is terminated or revoked.     Resp Syncytial Virus by PCR NEGATIVE NEGATIVE Final    Comment: (NOTE) Fact Sheet for Patients: bloggercourse.com  Fact Sheet for Healthcare Providers: seriousbroker.it  This test is not yet approved or cleared by the [place]  FDA and has been authorized for detection and/or diagnosis of SARS-CoV-2 by FDA under an Emergency Use Authorization (EUA). This EUA will remain in effect (meaning this test can be used) for the duration of the COVID-19 declaration under Section 564(b)(1) of the Act, 21 U.S.C. section 360bbb-3(b)(1), unless the authorization is terminated or revoked.  Performed at [facility], [address], [place], [place] [address]   Culture, blood (Routine X 2) w Reflex to ID Panel     Status: None   Collection Time: 06/09/24  5:36 AM   Specimen: BLOOD  Result Value Ref Range Status   Specimen Description BLOOD SITE NOT SPECIFIED  Final   Special Requests   Final    BOTTLES DRAWN AEROBIC AND ANAEROBIC Blood Culture results may not be optimal due to an inadequate volume of blood received in culture bottles   Culture   Final    NO GROWTH 5 DAYS Performed at [facility], [address], [place], [place] [address]    Report Status 06/14/2024 FINAL  Final  Culture, blood (Routine X 2) w Reflex to ID Panel     Status: None   Collection Time: 06/09/24  5:36 AM   Specimen: BLOOD  Result Value Ref Range Status   Specimen Description BLOOD SITE NOT SPECIFIED  Final   Special Requests   Final    BOTTLES DRAWN AEROBIC AND ANAEROBIC Blood Culture adequate volume   Culture   Final    NO GROWTH 5 DAYS Performed at [facility], [address] [place], [place] [address]    Report Status 06/14/2024 FINAL  Final  MRSA Next Gen by PCR, Nasal     Status: Abnormal   Collection  Time: 06/13/24  3:53  PM   Specimen: Nasal Mucosa; Nasal Swab  Result Value Ref Range Status   MRSA by PCR Next Gen DETECTED (A) NOT DETECTED Final    Comment: RESULT CALLED TO, READ BACK BY AND VERIFIED WITH: RN WENDI DONOVAN [phone] AT 2019, ADC (NOTE) The GeneXpert MRSA Assay (FDA approved for NASAL specimens only), is one component of a comprehensive MRSA colonization surveillance program. It is not intended to diagnose MRSA infection nor to guide or monitor treatment for MRSA infections. Test performance is not FDA approved in patients less than 25 years old. Performed at [facility], [address] [place], [place] [address]      Cathlyn July, NP [facility]  06/14/2024  2:32 PM     [1] No Known Allergies  "

## 2024-06-14 NOTE — Progress Notes (Addendum)
 VENTILATOR WEAN NOTE 06/14/2024  Start Mode: PRVC  Wean Mode: Pressure Support  Duration before failure: currently weaning  Reason for failure: N/A  Notes:   Patient placed on CPAP/PSV of 10/8 at 0845.  Tolerating well with better vent synchrony.  Will continue to monitor.

## 2024-06-14 NOTE — Progress Notes (Signed)
 Echocardiogram Echocardiogram Transesophageal has been performed.  Patrick [name] 06/14/2024, 12:11 PM

## 2024-06-15 ENCOUNTER — Inpatient Hospital Stay (HOSPITAL_COMMUNITY)

## 2024-06-15 ENCOUNTER — Encounter (HOSPITAL_COMMUNITY): Admission: EM | Disposition: E | Payer: Self-pay | Source: Home / Self Care | Attending: Internal Medicine

## 2024-06-15 DIAGNOSIS — A523 Neurosyphilis, unspecified: Secondary | ICD-10-CM | POA: Diagnosis not present

## 2024-06-15 DIAGNOSIS — I6389 Other cerebral infarction: Secondary | ICD-10-CM | POA: Diagnosis not present

## 2024-06-15 DIAGNOSIS — J189 Pneumonia, unspecified organism: Secondary | ICD-10-CM | POA: Diagnosis not present

## 2024-06-15 DIAGNOSIS — I330 Acute and subacute infective endocarditis: Secondary | ICD-10-CM | POA: Diagnosis not present

## 2024-06-15 DIAGNOSIS — F1911 Other psychoactive substance abuse, in remission: Secondary | ICD-10-CM

## 2024-06-15 DIAGNOSIS — R7881 Bacteremia: Secondary | ICD-10-CM | POA: Diagnosis not present

## 2024-06-15 DIAGNOSIS — R570 Cardiogenic shock: Secondary | ICD-10-CM | POA: Diagnosis not present

## 2024-06-15 DIAGNOSIS — R6521 Severe sepsis with septic shock: Secondary | ICD-10-CM | POA: Diagnosis not present

## 2024-06-15 DIAGNOSIS — I639 Cerebral infarction, unspecified: Secondary | ICD-10-CM | POA: Diagnosis not present

## 2024-06-15 DIAGNOSIS — A4102 Sepsis due to Methicillin resistant Staphylococcus aureus: Secondary | ICD-10-CM | POA: Diagnosis not present

## 2024-06-15 DIAGNOSIS — Z515 Encounter for palliative care: Secondary | ICD-10-CM

## 2024-06-15 DIAGNOSIS — N179 Acute kidney failure, unspecified: Secondary | ICD-10-CM

## 2024-06-15 DIAGNOSIS — J9601 Acute respiratory failure with hypoxia: Secondary | ICD-10-CM | POA: Diagnosis not present

## 2024-06-15 DIAGNOSIS — J15212 Pneumonia due to Methicillin resistant Staphylococcus aureus: Secondary | ICD-10-CM | POA: Diagnosis not present

## 2024-06-15 DIAGNOSIS — B9562 Methicillin resistant Staphylococcus aureus infection as the cause of diseases classified elsewhere: Secondary | ICD-10-CM | POA: Diagnosis not present

## 2024-06-15 DIAGNOSIS — E43 Unspecified severe protein-calorie malnutrition: Secondary | ICD-10-CM | POA: Insufficient documentation

## 2024-06-15 DIAGNOSIS — G9340 Encephalopathy, unspecified: Secondary | ICD-10-CM | POA: Diagnosis not present

## 2024-06-15 DIAGNOSIS — R7989 Other specified abnormal findings of blood chemistry: Secondary | ICD-10-CM | POA: Diagnosis not present

## 2024-06-15 LAB — GLUCOSE, CAPILLARY
Glucose-Capillary: 160 mg/dL — ABNORMAL HIGH (ref 70–99)
Glucose-Capillary: 151 mg/dL — ABNORMAL HIGH (ref 70–99)
Glucose-Capillary: 144 mg/dL — ABNORMAL HIGH (ref 70–99)
Glucose-Capillary: 149 mg/dL — ABNORMAL HIGH (ref 70–99)
Glucose-Capillary: 150 mg/dL — ABNORMAL HIGH (ref 70–99)
Glucose-Capillary: 127 mg/dL — ABNORMAL HIGH (ref 70–99)

## 2024-06-15 LAB — URINALYSIS, ROUTINE W REFLEX MICROSCOPIC
Bilirubin Urine: NEGATIVE
Glucose, UA: NEGATIVE mg/dL
Hgb urine dipstick: NEGATIVE
Ketones, ur: NEGATIVE mg/dL
Leukocytes,Ua: NEGATIVE
Nitrite: NEGATIVE
Protein, ur: 30 mg/dL — AB
Specific Gravity, Urine: 1.014 (ref 1.005–1.030)
pH: 5 (ref 5.0–8.0)

## 2024-06-15 LAB — POCT I-STAT 7, (LYTES, BLD GAS, ICA,H+H)
Acid-base deficit: 8 mmol/L — ABNORMAL HIGH (ref 0.0–2.0)
Bicarbonate: 16 mmol/L — ABNORMAL LOW (ref 20.0–28.0)
Calcium, Ion: 1.16 mmol/L (ref 1.15–1.40)
HCT: 28 % — ABNORMAL LOW (ref 39.0–52.0)
Hemoglobin: 9.5 g/dL — ABNORMAL LOW (ref 13.0–17.0)
O2 Saturation: 94 %
Patient temperature: 101.3
Potassium: 4.8 mmol/L (ref 3.5–5.1)
Sodium: 136 mmol/L (ref 135–145)
TCO2: 17 mmol/L — ABNORMAL LOW (ref 22–32)
pCO2 arterial: 30.3 mmHg — ABNORMAL LOW (ref 32–48)
pH, Arterial: 7.336 — ABNORMAL LOW (ref 7.35–7.45)
pO2, Arterial: 81 mmHg — ABNORMAL LOW (ref 83–108)

## 2024-06-15 LAB — BASIC METABOLIC PANEL WITH GFR
Anion gap: 12 (ref 5–15)
BUN: 58 mg/dL — ABNORMAL HIGH (ref 6–20)
CO2: 17 mmol/L — ABNORMAL LOW (ref 22–32)
Calcium: 8 mg/dL — ABNORMAL LOW (ref 8.9–10.3)
Chloride: 105 mmol/L (ref 98–111)
Creatinine, Ser: 2.39 mg/dL — ABNORMAL HIGH (ref 0.61–1.24)
GFR, Estimated: 34 mL/min — ABNORMAL LOW
Glucose, Bld: 145 mg/dL — ABNORMAL HIGH (ref 70–99)
Potassium: 4.9 mmol/L (ref 3.5–5.1)
Sodium: 133 mmol/L — ABNORMAL LOW (ref 135–145)

## 2024-06-15 LAB — CBC
HCT: 27.7 % — ABNORMAL LOW (ref 39.0–52.0)
Hemoglobin: 9.3 g/dL — ABNORMAL LOW (ref 13.0–17.0)
MCH: 30 pg (ref 26.0–34.0)
MCHC: 33.6 g/dL (ref 30.0–36.0)
MCV: 89.4 fL (ref 80.0–100.0)
Platelets: 208 K/uL (ref 150–400)
RBC: 3.1 MIL/uL — ABNORMAL LOW (ref 4.22–5.81)
RDW: 15.2 % (ref 11.5–15.5)
WBC: 37.3 K/uL — ABNORMAL HIGH (ref 4.0–10.5)
nRBC: 0 % (ref 0.0–0.2)

## 2024-06-15 LAB — PHOSPHORUS: Phosphorus: 4.1 mg/dL (ref 2.5–4.6)

## 2024-06-15 LAB — VANCOMYCIN, RANDOM: Vancomycin Rm: 35 ug/mL

## 2024-06-15 LAB — MAGNESIUM: Magnesium: 2.4 mg/dL (ref 1.7–2.4)

## 2024-06-15 SURGERY — TRANSESOPHAGEAL ECHOCARDIOGRAM (TEE) (CATHLAB)
Anesthesia: Monitor Anesthesia Care

## 2024-06-15 MED ORDER — ACETAMINOPHEN 325 MG PO TABS
650.0000 mg | ORAL_TABLET | Freq: Four times a day (QID) | ORAL | Status: DC | PRN
  Administered 2024-06-15 – 2024-06-17 (×8): 650 mg
  Filled 2024-06-15 (×8): qty 2

## 2024-06-15 MED ORDER — HYDROMORPHONE BOLUS VIA INFUSION
2.0000 mg | Freq: Once | INTRAVENOUS | Status: AC
  Administered 2024-06-15: 2 mg via INTRAVENOUS
  Filled 2024-06-15: qty 2

## 2024-06-15 MED ORDER — MIDAZOLAM HCL (PF) 5 MG/ML IJ SOLN
4.0000 mg | Freq: Once | INTRAMUSCULAR | Status: AC
  Administered 2024-06-15: 4 mg via INTRAVENOUS

## 2024-06-15 MED ORDER — LEVOFLOXACIN IN D5W 750 MG/150ML IV SOLN: 750.0000 mg | INTRAVENOUS | Status: DC

## 2024-06-15 MED ORDER — MIDAZOLAM-SODIUM CHLORIDE 100-0.9 MG/100ML-% IV SOLN
0.5000 mg/h | INTRAVENOUS | Status: DC
  Administered 2024-06-15 – 2024-06-16 (×2): 5 mg/h via INTRAVENOUS
  Administered 2024-06-17 – 2024-06-18 (×3): 7 mg/h via INTRAVENOUS
  Filled 2024-06-15 (×5): qty 100

## 2024-06-15 MED ORDER — ACETAMINOPHEN 650 MG RE SUPP: 650.0000 mg | Freq: Four times a day (QID) | RECTAL | Status: DC | PRN

## 2024-06-15 MED ORDER — LEVOFLOXACIN IN D5W 750 MG/150ML IV SOLN
750.0000 mg | INTRAVENOUS | Status: AC
  Administered 2024-06-16: 750 mg via INTRAVENOUS
  Filled 2024-06-15: qty 150

## 2024-06-15 MED ORDER — PENICILLIN G POTASSIUM 20000000 UNITS IJ SOLR
18.0000 10*6.[IU] | INTRAVENOUS | Status: AC
  Administered 2024-06-15 – 2024-06-17 (×3): 18 10*6.[IU] via INTRAVENOUS
  Filled 2024-06-15 (×3): qty 18
  Filled 2024-06-15: qty 13

## 2024-06-15 MED ORDER — MUPIROCIN 2 % EX OINT
1.0000 | TOPICAL_OINTMENT | Freq: Two times a day (BID) | CUTANEOUS | Status: DC
  Administered 2024-06-15 – 2024-06-17 (×6): 1 via NASAL
  Filled 2024-06-15: qty 22

## 2024-06-15 NOTE — Consult Note (Signed)
 "                                                                                   Consultation Note Date: 06/15/2024   Patient Name: Patrick [name]  DOB: September 05, 1980  MRN: [id number]  Age / Sex: 44 y.o., male  PCP: Patient, No Pcp Per Referring Physician: Pleas Newborn, MD  Reason for Consultation:  Patient with infective endocarditis, and a large vegetation, CTS not recommending surgery, recommending Hospice.   HPI/Patient Profile: 44 y.o. male  with past medical history of arthritis, polysubstance abuse including IV drug use admitted on 06/07/2024 with presentation of altered mental status. Workup revealed R MCA stroke.  There was suspicion for drug overdose and he received narcan , UDS from 12/30 reviewed and positive for fentanyl  and amphetamines. On initial presentation he was lethargic but able to answer questions with dysarthria. Additional workup revealed MRSA bacteremia and AV vegetation, and pneumonia, neurosyphilis, Hep B and Hep C. He had decline during admission and required intubation and initiation of IV pressor support on 1/5 for worsening ARDS. TEE on 1/6 revealed large AV vegetation and severe aortic regurgitation. Cardiothoracic surgery was consulted. He is not a candidate for surgery- comfort and hospice has been recommended. Palliative consulted to assist with complex medical decision making.   Primary Decision Maker NEXT OF KIN- daughter Judeth and her brother are legal surrogate decision makers.   Discussion: I reviewed Pulmonology notes from 1/7 - they met with patient's family today and reviewed patient's medical situation and very poor prognosis along with recommendations for DNR and comfort measures.  Per infectious disease note from 1/7- patient condition is not survivable without surgery and he is not a candidate for surgery.  Labs from 1/7 reviewed- WBC today 37.3. BMET 1/7 shows worsening kidney function today with Cr 2.39.  On evaluation patient lying in bed,  sedated and intubated. Multiple family members at bedside.  I met with patient's daughter, Judeth, patient's sister and Heaven's sister (not patient's daughter).  We reviewed patient's prolonged and complicated hospitalization.  Family shared their understanding that patient's condition is irreversible.  I discussed option for transition to full comfort measures only. Discussed transition to comfort measures only which includes stopping IV fluids, antibiotics, labs and providing symptom management for SOB, anxiety, nausea, vomiting, and other symptoms of dying.  I answered family's questions about dying process and symptom management.  Family was clear in their wish for patient to be extubated to comfort and allowed a peaceful dying process. Their primary goal is that he does not suffer. We discussed code status. Encouraged patient/family to consider DNR/DNI status understanding evidenced based poor outcomes in similar hospitalized patients, as the cause of the arrest is likely associated with chronic/terminal disease rather than a reversible acute cardio-pulmonary event.  Family agreed to change in code status to DNR.  Family expressed concern regarding patient's brother and how he will react to plan for extubation and comfort measures. They shared it may be necessary to have security present.  Emotional support provided as family shared stories about patient and discussed how they will be affected by his death.   Family requested to wait until tomorrow afternoon  for extubation to give family members a chance to visit.   SUMMARY OF RECOMMENDATIONS- Assessment/Plan -Patient with multiple comorbidities- infective endocarditis, ARDS, MRSA bacteremia, stroke, ventilator dependent- plan to transition patient to full comfort measures tomorrow and extubate- recommend continuing propofol , versed , and dilaudid  infusions for extubation -Code status changed to DNR- DNR order entered - I communicated the plan  for extubation and change in code status to attending MD   Code Status/Advance Care Planning:   Code Status: Do not attempt resuscitation (DNR) - Comfort care    Prognosis:   Hours - Days  Discharge Planning: Anticipated Hospital Death  Primary Diagnoses: Present on Admission:  Acute encephalopathy   Review of Systems  Unable to perform ROS   Physical Exam Vitals and nursing note reviewed.  Constitutional:      Appearance: He is ill-appearing.     Comments: Thin  Cardiovascular:     Rate and Rhythm: Normal rate.  Pulmonary:     Comments: intubated Neurological:     Comments: sedated     Vital Signs: BP (!) 124/49   Pulse 93   Temp 99.7 F (37.6 C) (Oral)   Resp (!) 21   Ht 5' 11 (1.803 m)   Wt 71.7 kg   SpO2 100%   BMI 22.05 kg/m  Pain Scale: CPOT   Pain Score: 0-No pain   SpO2: SpO2: 100 % O2 Device:SpO2: 100 % O2 Flow Rate: .O2 Flow Rate (L/min): 50 L/min  IO: Intake/output summary:  Intake/Output Summary (Last 24 hours) at 06/15/2024 1753 Last data filed at 06/15/2024 1400 Gross per 24 hour  Intake 3363.2 ml  Output 2900 ml  Net 463.2 ml    LBM: Last BM Date : 06/13/24 Baseline Weight: Weight: 79.2 kg Most recent weight: Weight: 71.7 kg       Thank you for this consult. Palliative medicine will continue to follow and assist as needed.  Time Total: 90 minutes  Signed by: Cassondra Stain, AGNP-C Palliative Medicine  Time includes:   I personally spent a total of 90 minutes in the care of the patient today including preparing to see the patient, getting/reviewing separately obtained history, performing a medically appropriate exam/evaluation, counseling and educating, placing orders, referring and communicating with other health care professionals, documenting clinical information in the EHR, and coordinating care.    Please contact Palliative Medicine Team phone at [phone] for questions and concerns.  For individual provider: See  Amion               "

## 2024-06-15 NOTE — Progress Notes (Signed)
 "   [facility]  Date of Admission:  06/07/2024     Reason for Follow Up: Acute encephalopathy  Total days of antibiotics 9         ASSESSMENT:  Mr. Sakai is a 44 y/o caucasian male with history of injection drug use admitted with stroke like symptoms and found to have multifocal stroke, MRSA bacteremia, MRSA pneumonia, and aortic valve endocarditis. Also with positive syphilis titer and with concern for neurosyphilis plan for treatment with Penicillin  G. Course complicated on 06/13/24 with increasing respiratory distress requiring mechanical ventilation. TEE with severe aortic regurgitation and aortic valve endocarditis.   Mr. Harriott continues to remain febrile. Blood culture from 06/14/24 are without growth in <24 hours.  Appears to be tolerating antibiotics with no adverse side effects. CVTS evaluated and not a surgical candidate. Remains at risk for further embolic strokes with significant burden of infection, however the complicating issue is his aortic valve regurgitation and would anticipate continued worsening in the setting of bacterial endocarditis. Dr. Overton spoke with family to provide an update regarding poor prognosis and may benefit from Palliative Care discussion. For now will continue with current dose of levofloxacin  and vancomycin  for MRSA infection and aspiration along with Penicillin  G for neurosyphilis. Continue therapeutic drug monitoring of renal function and vancomycin  levels. Contact precautions for MRSA. Mechanical ventilation and remaining medical and supportive care per PCCM.  PLAN:  Continue current dose of vancomycin  for MRSA and levofloxacin  for aspiration/gram negative coverage.  Therapeutic drug monitoring of renal function and vancomycin  levels Continue Penicillin  G for neurosyphilis.  Recommend Palliative Care consult given poor prognosis.  Contact precautions for MRSA.  Mechanical ventilation and remaining medical and supportive care per  Internal Medicine.   Principal Problem:   Acute encephalopathy Active Problems:   MRSA bacteremia   Neurosyphilis   Acute bacterial endocarditis   Acute ischemic stroke (HCC)   Sepsis (HCC)   Acute respiratory failure with hypoxia (HCC)   Pneumonia of both lungs due to infectious organism   Opiate overdose (HCC)    Chlorhexidine  Gluconate Cloth  6 each Topical Daily   docusate  100 mg Per Tube BID   enoxaparin  (LOVENOX ) injection  40 mg Subcutaneous Q24H   etomidate   20 mg Intravenous Once   feeding supplement (PROSource TF20)  60 mL Per Tube Daily    morphine  injection  1 mg Intravenous Once   mupirocin  ointment  1 Application Nasal BID   mouth rinse  15 mL Mouth Rinse Q2H   pantoprazole  (PROTONIX ) IV  40 mg Intravenous Q24H   polyethylene glycol  17 g Per Tube Daily   sodium chloride  flush  10-40 mL Intracatheter Q12H   sodium chloride  flush  3 mL Intravenous Once   thiamine   100 mg Per Tube Daily    SUBJECTIVE:  Febrile overnight with no acute events. Remains on mechanical ventilation. Appears to be tolerating antibiotics.    Allergies[1]   Review of Systems: Review of Systems  Unable to perform ROS: Intubated      OBJECTIVE: Vitals:   06/15/24 1200 06/15/24 1211 06/15/24 1213 06/15/24 1215  BP: (!) 122/47  (!) 120/57 (!) 120/57  Pulse: 91  90 89  Resp: (!) 28  (!) 27 (!) 22  Temp:  (!) 100.8 F (38.2 C)    TempSrc:  Axillary    SpO2: 99%  100% 100%  Weight:      Height:       Body mass index  is 22.05 kg/m.  Physical Exam Constitutional:      General: He is not in acute distress.    Appearance: He is well-developed. He is ill-appearing and diaphoretic.  Cardiovascular:     Rate and Rhythm: Regular rhythm. Tachycardia present.     Heart sounds: Normal heart sounds.  Pulmonary:     Effort: Pulmonary effort is normal.     Breath sounds: Rhonchi present.  Skin:    General: Skin is warm.     Lab Results Lab Results  Component Value Date    WBC 45.0 (H) 06/14/2024   HGB 9.5 (L) 06/15/2024   HCT 28.0 (L) 06/15/2024   MCV 89.4 06/14/2024   PLT 248 06/14/2024    Lab Results  Component Value Date   CREATININE 1.32 (H) 06/14/2024   BUN 45 (H) 06/14/2024   NA 136 06/15/2024   K 4.8 06/15/2024   CL 106 06/14/2024   CO2 18 (L) 06/14/2024    Lab Results  Component Value Date   ALT 44 06/14/2024   AST 45 (H) 06/14/2024   GGT 68 (H) 06/08/2024   ALKPHOS 99 06/14/2024   BILITOT 0.9 06/14/2024     Microbiology: Recent Results (from the past 240 hours)  Blood culture (routine x 2)     Status: Abnormal (Preliminary result)   Collection Time: 06/08/24 12:35 AM   Specimen: BLOOD RIGHT ARM  Result Value Ref Range Status   Specimen Description BLOOD RIGHT ARM  Final   Special Requests   Final    BOTTLES DRAWN AEROBIC AND ANAEROBIC Blood Culture adequate volume   Culture  Setup Time   Final    GRAM POSITIVE COCCI IN CLUSTERS IN BOTH AEROBIC AND ANAEROBIC BOTTLES CRITICAL RESULT CALLED TO, READ BACK BY AND VERIFIED WITH: PHARMD J.FRENS AT 1213 ON 06/08/2024 BY T.SAAD.    Culture (A)  Final    METHICILLIN RESISTANT STAPHYLOCOCCUS AUREUS Sent to [facility] for further susceptibility testing. Performed at [facility], [address] [place], [place] [address]    Report Status PENDING  Incomplete   Organism ID, Bacteria METHICILLIN RESISTANT STAPHYLOCOCCUS AUREUS  Final      Susceptibility   Methicillin resistant staphylococcus aureus - MIC*    CIPROFLOXACIN >=8 RESISTANT Resistant     ERYTHROMYCIN >=8 RESISTANT Resistant     GENTAMICIN <=0.5 SENSITIVE Sensitive     OXACILLIN >=4 RESISTANT Resistant     TETRACYCLINE <=1 SENSITIVE Sensitive     VANCOMYCIN  1 SENSITIVE Sensitive     TRIMETH/SULFA >=320 RESISTANT Resistant     CLINDAMYCIN >=8 RESISTANT Resistant     RIFAMPIN <=0.5 SENSITIVE Sensitive     Inducible Clindamycin NEGATIVE Sensitive     LINEZOLID 2 SENSITIVE Sensitive     * METHICILLIN RESISTANT  STAPHYLOCOCCUS AUREUS  Blood Culture ID Panel (Reflexed)     Status: Abnormal   Collection Time: 06/08/24 12:35 AM  Result Value Ref Range Status   Enterococcus faecalis NOT DETECTED NOT DETECTED Final   Enterococcus Faecium NOT DETECTED NOT DETECTED Final   Listeria monocytogenes NOT DETECTED NOT DETECTED Final   Staphylococcus species DETECTED (A) NOT DETECTED Final    Comment: CRITICAL RESULT CALLED TO, READ BACK BY AND VERIFIED WITH: PHARMD J.FRENS AT 1213 ON 06/08/2024 BY T.SAAD.    Staphylococcus aureus (BCID) DETECTED (A) NOT DETECTED Final    Comment: Methicillin (oxacillin)-resistant Staphylococcus aureus (MRSA). MRSA is predictably resistant to beta-lactam antibiotics (except ceftaroline). Preferred therapy is vancomycin  unless clinically contraindicated. Patient requires contact  precautions if  hospitalized. CRITICAL RESULT CALLED TO, READ BACK BY AND VERIFIED WITH: PHARMD J.FRENS AT 1213 ON 06/08/2024 BY T.SAAD.    Staphylococcus epidermidis NOT DETECTED NOT DETECTED Final   Staphylococcus lugdunensis NOT DETECTED NOT DETECTED Final   Streptococcus species NOT DETECTED NOT DETECTED Final   Streptococcus agalactiae NOT DETECTED NOT DETECTED Final   Streptococcus pneumoniae NOT DETECTED NOT DETECTED Final   Streptococcus pyogenes NOT DETECTED NOT DETECTED Final   A.calcoaceticus-baumannii NOT DETECTED NOT DETECTED Final   Bacteroides fragilis NOT DETECTED NOT DETECTED Final   Enterobacterales NOT DETECTED NOT DETECTED Final   Enterobacter cloacae complex NOT DETECTED NOT DETECTED Final   Escherichia coli NOT DETECTED NOT DETECTED Final   Klebsiella aerogenes NOT DETECTED NOT DETECTED Final   Klebsiella oxytoca NOT DETECTED NOT DETECTED Final   Klebsiella pneumoniae NOT DETECTED NOT DETECTED Final   Proteus species NOT DETECTED NOT DETECTED Final   Salmonella species NOT DETECTED NOT DETECTED Final   Serratia marcescens NOT DETECTED NOT DETECTED Final   Haemophilus  influenzae NOT DETECTED NOT DETECTED Final   Neisseria meningitidis NOT DETECTED NOT DETECTED Final   Pseudomonas aeruginosa NOT DETECTED NOT DETECTED Final   Stenotrophomonas maltophilia NOT DETECTED NOT DETECTED Final   Candida albicans NOT DETECTED NOT DETECTED Final   Candida auris NOT DETECTED NOT DETECTED Final   Candida glabrata NOT DETECTED NOT DETECTED Final   Candida krusei NOT DETECTED NOT DETECTED Final   Candida parapsilosis NOT DETECTED NOT DETECTED Final   Candida tropicalis NOT DETECTED NOT DETECTED Final   Cryptococcus neoformans/gattii NOT DETECTED NOT DETECTED Final   Meth resistant mecA/C and MREJ DETECTED (A) NOT DETECTED Final    Comment: CRITICAL RESULT CALLED TO, READ BACK BY AND VERIFIED WITH: PHARMD J.FRENS AT 1213 ON 06/08/2024 BY T.SAAD. Performed at [facility], [address], [place], [place] [address]   MIC (1 Drug)-     Status: Abnormal   Collection Time: 06/08/24 12:35 AM  Result Value Ref Range Status   Min Inhibitory Conc (1 Drug) Final report (A)  Corrected    Comment: (NOTE) Performed At: [facility] [address] [place], [place] [id number] Jennette Shorter MD Ey:[id number] CORRECTED ON 01/05 AT 1636: PREVIOUSLY REPORTED AS Preliminary report    Source [id number] MRSA BLOOD DAPTOMYCIN  Final    Comment: Performed at [facility], [address] [place], [place] [address]  MIC Result     Status: Abnormal   Collection Time: 06/08/24 12:35 AM  Result Value Ref Range Status   Result 1 (MIC) Comment (A)  Final    Comment: (NOTE) Methicillin - resistant Staphylococcus aureus Identification performed by account, not confirmed by this laboratory. DAPTOMYCIN .50ug/ml SUSCPETIBLE Performed At: [facility] [address] [place], [place] [id number] Jennette Shorter MD Ey:[id number]   Blood culture (routine x 2)     Status: Abnormal   Collection Time: 06/08/24 12:40 AM   Specimen: BLOOD LEFT ARM  Result Value Ref Range  Status   Specimen Description BLOOD LEFT ARM  Final   Special Requests   Final    BOTTLES DRAWN AEROBIC AND ANAEROBIC Blood Culture adequate volume   Culture  Setup Time   Final    GRAM POSITIVE COCCI IN CLUSTERS IN BOTH AEROBIC AND ANAEROBIC BOTTLES CRITICAL VALUE NOTED.  VALUE IS CONSISTENT WITH PREVIOUSLY REPORTED AND CALLED VALUE.    Culture (A)  Final    STAPHYLOCOCCUS AUREUS SUSCEPTIBILITIES PERFORMED ON PREVIOUS CULTURE WITHIN THE LAST  5 DAYS. Performed at [facility], [address] [place], [place] [address]    Report Status 06/10/2024 FINAL  Final  Resp panel by RT-PCR (RSV, Flu A&B, Covid) Anterior Nasal Swab     Status: None   Collection Time: 06/08/24  3:54 AM   Specimen: Anterior Nasal Swab  Result Value Ref Range Status   SARS Coronavirus 2 by RT PCR NEGATIVE NEGATIVE Final   Influenza A by PCR NEGATIVE NEGATIVE Final   Influenza B by PCR NEGATIVE NEGATIVE Final    Comment: (NOTE) The Xpert Xpress SARS-CoV-2/FLU/RSV plus assay is intended as an aid in the diagnosis of influenza from Nasopharyngeal swab specimens and should not be used as a sole basis for treatment. Nasal washings and aspirates are unacceptable for Xpert Xpress SARS-CoV-2/FLU/RSV testing.  Fact Sheet for Patients: bloggercourse.com  Fact Sheet for Healthcare Providers: seriousbroker.it  This test is not yet approved or cleared by the [place]  FDA and has been authorized for detection and/or diagnosis of SARS-CoV-2 by FDA under an Emergency Use Authorization (EUA). This EUA will remain in effect (meaning this test can be used) for the duration of the COVID-19 declaration under Section 564(b)(1) of the Act, 21 U.S.C. section 360bbb-3(b)(1), unless the authorization is terminated or revoked.     Resp Syncytial Virus by PCR NEGATIVE NEGATIVE Final    Comment: (NOTE) Fact Sheet for  Patients: bloggercourse.com  Fact Sheet for Healthcare Providers: seriousbroker.it  This test is not yet approved or cleared by the [place]  FDA and has been authorized for detection and/or diagnosis of SARS-CoV-2 by FDA under an Emergency Use Authorization (EUA). This EUA will remain in effect (meaning this test can be used) for the duration of the COVID-19 declaration under Section 564(b)(1) of the Act, 21 U.S.C. section 360bbb-3(b)(1), unless the authorization is terminated or revoked.  Performed at [facility], [address] [place], [place] [address]   Culture, blood (Routine X 2) w Reflex to ID Panel     Status: None   Collection Time: 06/09/24  5:36 AM   Specimen: BLOOD  Result Value Ref Range Status   Specimen Description BLOOD SITE NOT SPECIFIED  Final   Special Requests   Final    BOTTLES DRAWN AEROBIC AND ANAEROBIC Blood Culture results may not be optimal due to an inadequate volume of blood received in culture bottles   Culture   Final    NO GROWTH 5 DAYS Performed at [facility], [address], [place], [place] [address]    Report Status 06/14/2024 FINAL  Final  Culture, blood (Routine X 2) w Reflex to ID Panel     Status: None   Collection Time: 06/09/24  5:36 AM   Specimen: BLOOD  Result Value Ref Range Status   Specimen Description BLOOD SITE NOT SPECIFIED  Final   Special Requests   Final    BOTTLES DRAWN AEROBIC AND ANAEROBIC Blood Culture adequate volume   Culture   Final    NO GROWTH 5 DAYS Performed at [facility], [address] [place], [place] [address]    Report Status 06/14/2024 FINAL  Final  MRSA Next Gen by PCR, Nasal     Status: Abnormal   Collection Time: 06/13/24  3:53 PM   Specimen: Nasal Mucosa; Nasal Swab  Result Value Ref Range Status   MRSA by PCR Next Gen DETECTED (A) NOT DETECTED Final    Comment: RESULT CALLED TO, READ BACK BY  AND VERIFIED WITH: RN WENDI DONOVAN [phone] AT 2019, ADC (NOTE) The GeneXpert MRSA Assay (FDA approved for NASAL specimens only), is one component of a comprehensive MRSA colonization surveillance program. It is not intended to diagnose MRSA infection nor to guide or monitor treatment for MRSA infections. Test performance is not FDA approved in patients less than 17 years old. Performed at [facility], [address], [place], [place] [address]   Culture, blood (Routine X 2) w Reflex to ID Panel     Status: None (Preliminary result)   Collection Time: 06/14/24 11:47 AM   Specimen: BLOOD RIGHT HAND  Result Value Ref Range Status   Specimen Description BLOOD RIGHT HAND  Final   Special Requests   Final    BOTTLES DRAWN AEROBIC AND ANAEROBIC Blood Culture adequate volume   Culture   Final    NO GROWTH < 24 HOURS Performed at [facility], [address] [place], [place] [address]    Report Status PENDING  Incomplete  Culture, blood (Routine X 2) w Reflex to ID Panel     Status: None (Preliminary result)   Collection Time: 06/14/24  2:09 PM   Specimen: BLOOD LEFT HAND  Result Value Ref Range Status   Specimen Description BLOOD LEFT HAND  Final   Special Requests   Final    BOTTLES DRAWN AEROBIC AND ANAEROBIC Blood Culture adequate volume   Culture   Final    NO GROWTH < 24 HOURS Performed at [facility], [address], [place], [place] [address]    Report Status PENDING  Incomplete     Cathlyn July, NP [facility]  06/15/2024  12:27 PM     [1] No Known Allergies  "

## 2024-06-15 NOTE — Progress Notes (Signed)
 OT Cancellation Note AND SIGN OFF DUE TO MEDICAL DECLINE  Patient Details Name: Patrick [name] MRN: [id number] DOB: [birth year]   Cancelled Treatment:    Reason Eval/Treat Not Completed: Medical issues which prohibited therapy (Pt transferred to ICU, on the vent, RASS -3) Due to medical decline, OT will sign off. Please re-order therapy team when medically appropriate.   [name] [name] 06/15/2024, 8:01 AM  [name] DEL OTR/L Acute Rehabilitation Services Office: [phone]

## 2024-06-15 NOTE — Progress Notes (Signed)
 Pharmacy Antibiotic Note  Patrick [name] is a 44 y.o. 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  Pharmacy has been consulted for vancomycin  dosing.   Vancomycin  kinetic parameters:  1/5: ke = 0.123, half life ~5.6 hours, Vd ~51.6 L 1/6: ke ~0.075   Vancomycin  levels: 06/13/2024 vancomycin  trough (collected at 0834) = 10 mcg/mL 06/14/2024 vancomycin  random (collected at 0838) = 34 mcg/mL 06/15/2024 vancomycin  random (collected at 1420) = 35 mcg/mL   Renal markers continue to increase (Scr 2.39 today) and vancomycin  random level is supratherapeutic at 35 mcg/mL. Will hold vancomycin  doses for now and obtain another vancomycin  random level tomorrow morning.   Plan: Hold vancomycin  and order vancomycin  random level for tomorrow Trend WBC, temp, renal function, clinical progress  Height: 5' 11 (180.3 cm) Weight: 71.7 kg (158 lb 1.1 oz) IBW/kg (Calculated) : 75.3  Temp (24hrs), Avg:101.6 F (38.7 C), Min:99.7 F (37.6 C), Max:103.3 F (39.6 C)  Recent Labs  Lab 06/10/24 0148 06/10/24 0958 06/11/24 0543 06/12/24 0616 06/13/24 0200 06/13/24 0223 06/13/24 0834 06/14/24 0433 06/14/24 0838 06/15/24 1133 06/15/24 1420  WBC 23.1*  --  25.0* 29.0*  --  26.4*  --  45.0*  --  37.3*  --   CREATININE 0.72  --  0.84 0.80  --  0.74  --  1.32*  --  2.39*  --   VANCOTROUGH  --    < >  --   --   --  DISREGARD TEST RESULTS, TEST WILL BE CREDITED. TEST DRAWN BEFORE REQUESTED TIME. 10*  --   --   --   --   VANCOPEAK 21*  --   --   --  42*  --   --   --   --   --   --   VANCORANDOM  --   --   --   --   --   --   --   --  34  --  35   < > = values in this interval not displayed.    Estimated Creatinine Clearance: 40.8 mL/min (A) (by C-G formula based on SCr of 2.39 mg/dL (H)).    Allergies[1]  Medications this admission:  Cefepime  12/31 x1 dose Vanc 12/31 >> Penicillin  G 1/2 >> Levofloxacin  1/5 >> (1/9)   Microbiology: 12/31  bcx: 4/4 MRSA 12/31 RVP: neg 1/1 BCx: ngtd x4 days 1/6 Bcx: NGTD  Thank you for involving pharmacy in this patient's care.  Feliciano Close, PharmD PGY2 Infectious Diseases Pharmacy Resident      [1] No Known Allergies

## 2024-06-15 NOTE — Progress Notes (Signed)
 PHARMACY NOTE:  ANTIMICROBIAL RENAL DOSAGE ADJUSTMENT  Current antimicrobial regimen includes a mismatch between antimicrobial dosage and estimated renal function.  As per policy approved by the Pharmacy & Therapeutics and Medical Executive Committees, the antimicrobial dosage will be adjusted accordingly.  Current antimicrobial dosage:  Levofloxacin  750mg  IV Q24H  Indication: HCAP  Renal Function:  Estimated Creatinine Clearance: 40.8 mL/min (A) (by C-G formula based on SCr of 2.39 mg/dL (H)). []      On intermittent HD, scheduled: []      On CRRT    Antimicrobial dosage has been changed to:  750mg  IV Q48H     Thank you for allowing pharmacy to be involved with this patient's care.  Mendel Barter, PharmD PGY1 Clinical Pharmacist [facility]  06/15/2024 1:57 PM

## 2024-06-15 NOTE — Progress Notes (Incomplete)
 F/u on VR

## 2024-06-15 NOTE — Progress Notes (Signed)
 PHARMACY NOTE:  ANTIMICROBIAL RENAL DOSAGE ADJUSTMENT  Current antimicrobial regimen includes a mismatch between antimicrobial dosage and estimated renal function.  As per policy approved by the Pharmacy & Therapeutics and Medical Executive Committees, the antimicrobial dosage will be adjusted accordingly.  Current antimicrobial dosage:  Penicillin  G IV 24 million units continuous infusion  Indication: neurosyphilis  Renal Function:  Estimated Creatinine Clearance: 40.8 mL/min (A) (by C-G formula based on SCr of 2.39 mg/dL (H)). []      On intermittent HD, scheduled: []      On CRRT    Antimicrobial dosage has been changed to:  18 million units continuous infusion  Additional comments: Dose has been decreased by 25% based on information available on UpToDate.  Thank you for allowing pharmacy to be involved with this patient's care.  Mendel Barter, PharmD PGY1 Clinical Pharmacist [facility]  06/15/2024 1:59 PM

## 2024-06-15 NOTE — Progress Notes (Signed)
 "  NAME:  Patrick [name], MRN:  [id number], DOB:  05-02-1981, LOS: 7 ADMISSION DATE:  06/07/2024 CONSULTATION DATE:  06/13/2024 REFERRING MD:  Vianne - TRH, CHIEF COMPLAINT:  Acute respiratory failure   History of Present Illness:  44 year old man who presented to [facility] 12/30 as a Code Stroke with L facial droop, L arm weakness and slurred speech. PMHx significant for arthritis, polysubstance abuse including IVDU.  On ED arrival, patient was altered with LKW ~0100 12/30. Unresponsive requiring BVM ventilations. Responsiveness improved with Narcan  administration, but ongoing facial asymmetry was noted. Labs were notable for WBC 20.1, Hgb 15.1, Plt 108. INR 1.3. Na 128, K 4.5, CO2 25, BUN/Cr 36/1.00. AST/ALT 144/71, Alk Phos 181, Tbili 0.9. Ethanol negative, ammonia 28. UDS +amphetamines, fentanyl . VBG pH 7.474/pCO2 36.6/bicarb 26.9. LA 2.5. PCT 3.05 BCID +MRSA. COVID/Flu/RSV negative. CT Head 12/30 showed intermediate-sized area of hypoattenuation within the R MCA territory c/w acute ischemic changes. CTA Head/Neck 12/31 negative for LVO, +atherosclerosis. CT Chest/A/P 12/31 showed multifocal PNA, diffuse bronchial wall thickening with moderate mucous impaction RLL, hepatosplenomegaly, R kidney atrophy, diverticulosis. MRI Brain demonstrated acute/subacute infarction at the R frontoparietal junction (R MCA territory) with mild HT, small areas of additional infarct/microhemorrhage. Ultimately admitted to [facility] service. TTE with concern for AV vegetation; ID was consulted for possible IE.  Cards was consulted for TEE.  On 1/5, Rapid Response was called for respiratory distress with tachypnea to 40s-50s, tachycardia to 120s, and SpO2 89% with increased WOB. PCCM was consulted and patient was transferred to ICU. Intubated on arrival to ICU.  Pertinent Medical History:   Past Medical History:  Diagnosis Date   Arthritis    IVDU (intravenous drug use)    MRSA bacteremia    Polysubstance abuse (HCC)     Significant Hospital Events: Including procedures, antibiotic start and stop dates in addition to other pertinent events   12/30 - Presented to ED as Code Stroke. 12/31 - TTE c/f AV vegetation/infective endocarditis, ID consulted, Cards consulted for TEE. 1/5 - Rapid Response for tachypnea, tachycardia, increased WOB. PCCM consulted. Transferred to ICU. Intubated. Levophed  initiated.   Interim History / Subjective:  Overnight Tmax 103.5, decreased to 101.3 Sedated and intubated 0.3 of vasopressin  and NE  Core track placed for TF this morning. Examined at bedside, does not follow commands.  Objective:  Blood pressure (!) 116/48, pulse 87, temperature (!) 101.2 F (38.4 C), temperature source Axillary, resp. rate 20, height 5' 11 (1.803 m), weight 71.7 kg, SpO2 100%.    Vent Mode: PRVC FiO2 (%):  [40 %-50 %] 40 % Set Rate:  [20 bmp] 20 bmp Vt Set:  [620 mL] 620 mL PEEP:  [8 cmH20] 8 cmH20 Plateau Pressure:  [20 cmH20] 20 cmH20   Labs: WBC 45.0 > 37.3 SCR 1.32 > 2.39  Micro:   Intake/Output Summary (Last 24 hours) at 06/15/2024 1123 Last data filed at 06/15/2024 1100 Gross per 24 hour  Intake 4184.2 ml  Output 2850 ml  Net 1334.2 ml   Filed Weights   06/07/24 2338 06/09/24 0444 06/10/24 0417  Weight: 79.2 kg 74.3 kg 71.7 kg    Physical Examination:  General: Acute-on-chronically ill-appearing middle-aged man in NAD. HEENT: [place]/AT, anicteric sclera, PERRL 2mm, moist mucous membranes. Neuro: Intubated, sedated. Does not respond to verbal, tactile or noxious stimuli. Not following commands. +Corneal, +Cough, and +Gag  CV: Tachycardic to 130s, regular rhythm, ?systolic murmur PULM: Breathing tachypneic to 30s and mildly labored on vent (PEEP 5, FiO2 50%). Lung fields  coarse throughout, most notably over bilateral bases. GI: Soft, nontender, nondistended. Normoactive bowel sounds. Extremities: No LE edema noted. Skin: Warm/dry, +track marks.  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 - CTS recommending hospice due to poor prognosis, palliative consulted. - continue levophed  and vasopressin  to keep MAP >65, wean as tolerated. - ID following, not recommending CT head abdomen pelvis as that would not change management. 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  Labs:  CBC: Recent Labs  Lab 06/10/24 0148 06/11/24 0543 06/12/24 9383 06/13/24 0223 06/13/24 1547 06/13/24 1955 06/14/24 0433 06/14/24 1215  WBC 23.1* 25.0* 29.0* 26.4*  --   --  45.0*  --   HGB 13.1 12.0* 11.7* 11.4* 12.2* 11.2* 10.7* 11.2*  HCT 36.9* 33.5* 34.0* 34.4* 36.0* 33.0* 32.1* 33.0*  MCV 82.2 81.9 84.2 88.4  --   --  89.4  --   PLT 145* 180 187 137*  --   --  248  --    Basic Metabolic Panel: Recent Labs  Lab 06/10/24 0148 06/11/24 0543 06/12/24 0616 06/13/24 0223 06/13/24 1547 06/13/24 1955 06/14/24 0433 06/14/24 1215 06/14/24 1524 06/15/24 0500  NA 135 138 136 137 143 138 136 139  --   --   K 3.5 3.5 3.9 4.0 4.7 4.4 3.9 4.6  --   --   CL 101 104 107 108  --   --  106  --   --   --   CO2  24 23 19* 16*  --   --  18*  --   --   --   GLUCOSE 131* 159* 139* 127*  --   --  145*  --   --   --   BUN 23* 21* 26* 29*  --   --  45*  --   --   --   CREATININE 0.72 0.84 0.80 0.74  --   --  1.32*  --   --   --   CALCIUM 8.2* 7.7* 7.8* 7.8*  --   --  7.8*  --   --   --   MG 2.0 2.1 2.3 2.3  --   --  2.4  --   --  2.4  PHOS  --   --  3.5 3.6  --   --   --   --  3.7 4.1   GFR: Estimated Creatinine Clearance: 73.9 mL/min (A) (by C-G formula based on SCr of 1.32 mg/dL (H)). Recent Labs  Lab 06/11/24 0543 06/12/24 0616 06/13/24 0223 06/14/24 0433  WBC 25.0* 29.0* 26.4* 45.0*   Liver Function Tests: Recent Labs  Lab 06/10/24 0148 06/11/24 0543 06/12/24 9383 06/13/24 0223 06/14/24  0433  AST 101* 66* 58* 68* 45*  ALT 68* 61* 44 52* 44  ALKPHOS 208* 172* 148* 118 99  BILITOT 1.7* 1.6* 1.4* 1.1 0.9  PROT 6.8 7.4 7.8 7.8 7.4  ALBUMIN 2.7* 2.8* 2.7* 2.7* 2.7*   No results for input(s): LIPASE, AMYLASE in the last 168 hours. No results for input(s): AMMONIA in the last 168 hours.  ABG:    Component Value Date/Time   PHART 7.354 06/14/2024 1215   PCO2ART 32.3 06/14/2024 1215   PO2ART 95 06/14/2024 1215   HCO3 17.6 (L) 06/14/2024 1215   TCO2 19 (L) 06/14/2024 1215   ACIDBASEDEF 6.0 (H) 06/14/2024 1215   O2SAT 96 06/14/2024 1215    Coagulation Profile: No results for input(s): INR, PROTIME in the last 168 hours.  Cardiac Enzymes: No results for input(s): CKTOTAL, CKMB, CKMBINDEX, TROPONINI in the last 168 hours.  HbA1C: Hgb A1c MFr Bld  Date/Time Value Ref Range Status  06/08/2024 09:03 AM 6.0 (H) 4.8 - 5.6 % Final    Comment:    (NOTE) Diagnosis of Diabetes The following HbA1c ranges recommended by the American Diabetes Association (ADA) may be used as an aid in the diagnosis of diabetes mellitus.  Hemoglobin             Suggested A1C NGSP%              Diagnosis  <5.7                   Non Diabetic  5.7-6.4                 Pre-Diabetic  >6.4                   Diabetic  <7.0                   Glycemic control for                       adults with diabetes.     CBG: Recent Labs  Lab 06/14/24 1603 06/14/24 1936 06/14/24 2319 06/15/24 0327 06/15/24 0726  GLUCAP 148* 120* 126* 160* 151*   Review of Systems:   Patient is encephalopathic and/or intubated; therefore, history has been obtained from chart review.   Past Medical History:  He,  has a past medical history of Arthritis, IVDU (intravenous drug use), MRSA bacteremia, and Polysubstance abuse (HCC).   Surgical History:  No past surgical history on file.   Social History:   reports that he has been smoking. He does not have any smokeless tobacco history on file. He reports current alcohol use. He reports that he does not use drugs.   Family History:  His family history is not on file.   Allergies: Allergies[1]   Home Medications: Prior to Admission medications  Medication Sig Start Date End Date Taking? Authorizing Provider  acetaminophen  (TYLENOL ) 500 MG tablet Take 1,000 mg by mouth every 6 (six) hours as needed for moderate pain.   Yes [provider]  albuterol  (PROVENTIL  HFA;VENTOLIN  HFA) 108 (90 Base) MCG/ACT inhaler Inhale 2 puffs into the lungs every 6 (six) hours as needed for wheezing or shortness of breath.   Yes [provider]  amoxicillin-clavulanate (AUGMENTIN) 875-125 MG tablet Take 1 tablet by mouth 2 (two) times daily. Patient not taking: Reported on 06/08/2024 06/07/24   [provider]    Critical care time:   The patient is critically ill  with multiple organ system failure and requires high complexity decision making for assessment and support, frequent evaluation and titration of therapies, advanced monitoring, review of radiographic studies and interpretation of complex data.   Critical Care Time devoted to patient care services, exclusive of separately billable procedures, described in  this note is 30 minutes. Danella Philson, M.D.  Internal Medicine Resident, PGY-2  11:24 AM, 06/15/2024      [1] No Known Allergies  "

## 2024-06-15 NOTE — Progress Notes (Signed)
 Brief Nutrition Support Note  Pt underwent cortrak placement this AM, TF infusing at 35 and will plan to continue to advance slowly. Family present in room this afternoon but not able to provide a hx, currently on the phone. On exam, pt very thin with significant muscle and fat deficits consistent with chronic poor nutrition and severe malnutrition. See results below. 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   Flowsheet Row Most Recent Value  Orbital Region Severe depletion  Upper Arm Region Severe depletion  Thoracic and Lumbar Region Severe depletion  Buccal Region Moderate depletion  Temple Region Severe depletion  Clavicle Bone Region Severe depletion  Clavicle and Acromion Bone Region Severe depletion  Scapular Bone Region Moderate depletion  Dorsal Hand Moderate depletion  Patellar Region Moderate depletion  Anterior Thigh Region Moderate depletion  Posterior Calf Region Moderate depletion  Edema (RD Assessment) None  Hair Reviewed  Eyes Reviewed  Mouth Reviewed  Skin Reviewed  Nails Reviewed    INTERVENTION:  Continue tube feeding via Cortrak: Osmolite 1.5 at 65 ml/h (1560 ml per day) Continue to slowly advance rate every 12 hours to reach goal Prosource TF20 60ml 1x/d Provides 2340 kcal, 118 gm protein, 1189 ml free water daily Pt is at risk for refeeding syndrome given polysubstance abuse. Monitor magnesium and phosphorus daily x 3 days, MD to replete as needed. 100mg  thiamine  x 5 days     Vernell Lukes, RD, LDN, CNSC Registered Dietitian II Please reach out via secure chat

## 2024-06-15 NOTE — IPAL (Signed)
" °  Interdisciplinary Goals of Care Family Meeting   Date carried out: 06/15/2024  Location of the meeting: Unit/family room  Member's involved: Physician, Family Member or next of kin, and Other: multiple family members including pt's brother, daughter-son in social worker, aunt and rest- around 35-12  Durable Power of Attorney or environmental health practitioner: daughter (43 year old)    Discussion: We discussed goals of care for [employer] .  Explained his admission diagnosis, clinical worsening over the days since admission, ICU course, lab/test results- overall prognosis as per my discussion with cardiology, CT surgery and ID. Family inquiring if another facility would offer his cardiac surgery and I explained that given his current clinical status and stroke, it is very unlikely that he would be offered cardiac surgery at any other facility. Brother desires to speak to surgeon but rest of the family seem to understand the nature of his disease. I advised DNR and comfort measures. Family will discuss among themselves. I will consult palliative to assist with this ongoing conversation.  Code status:   Code Status: Full Code   Disposition: Continue current acute care  Time spent for the meeting: 30  Kaien Pezzullo, MD  06/15/2024, 4:49 PM   "

## 2024-06-15 NOTE — Procedures (Signed)
 Cortrak  Person Inserting Tube:  Mady Dolly, RD Tube Type:  Cortrak - 43 inches Tube Size:  10 Tube Location:  Left nare Secured by: Bridle Initial Placement:  Gastric Technique Used to Measure Tube Placement:  Marking at nare/corner of mouth Cortrak Secured At:  68 cm   Cortrak Tube Team Note:  Consult received to place a Cortrak feeding tube.   No x-ray is required. RN may begin using tube.   If the tube becomes dislodged please keep the tube and contact the Cortrak team at [email or web address] for replacement.  If after hours and replacement cannot be delayed, place a NG tube and confirm placement with an abdominal x-ray.    Dolly Mady MS, RD, LDN Registered Dietitian I Clinical Nutrition RD Inpatient Contact Info in Amion

## 2024-06-16 DIAGNOSIS — J9601 Acute respiratory failure with hypoxia: Secondary | ICD-10-CM | POA: Diagnosis not present

## 2024-06-16 DIAGNOSIS — Z515 Encounter for palliative care: Secondary | ICD-10-CM | POA: Diagnosis not present

## 2024-06-16 DIAGNOSIS — R578 Other shock: Secondary | ICD-10-CM

## 2024-06-16 DIAGNOSIS — I639 Cerebral infarction, unspecified: Secondary | ICD-10-CM | POA: Diagnosis not present

## 2024-06-16 DIAGNOSIS — F1991 Other psychoactive substance use, unspecified, in remission: Secondary | ICD-10-CM

## 2024-06-16 DIAGNOSIS — G9340 Encephalopathy, unspecified: Secondary | ICD-10-CM | POA: Diagnosis not present

## 2024-06-16 DIAGNOSIS — A523 Neurosyphilis, unspecified: Secondary | ICD-10-CM | POA: Diagnosis not present

## 2024-06-16 DIAGNOSIS — I330 Acute and subacute infective endocarditis: Secondary | ICD-10-CM | POA: Diagnosis not present

## 2024-06-16 DIAGNOSIS — J189 Pneumonia, unspecified organism: Secondary | ICD-10-CM | POA: Diagnosis not present

## 2024-06-16 DIAGNOSIS — R7881 Bacteremia: Secondary | ICD-10-CM | POA: Diagnosis not present

## 2024-06-16 DIAGNOSIS — N179 Acute kidney failure, unspecified: Secondary | ICD-10-CM | POA: Diagnosis not present

## 2024-06-16 DIAGNOSIS — J15212 Pneumonia due to Methicillin resistant Staphylococcus aureus: Secondary | ICD-10-CM | POA: Diagnosis not present

## 2024-06-16 DIAGNOSIS — B9562 Methicillin resistant Staphylococcus aureus infection as the cause of diseases classified elsewhere: Secondary | ICD-10-CM | POA: Diagnosis not present

## 2024-06-16 LAB — BASIC METABOLIC PANEL WITH GFR
Anion gap: 11 (ref 5–15)
BUN: 67 mg/dL — ABNORMAL HIGH (ref 6–20)
CO2: 17 mmol/L — ABNORMAL LOW (ref 22–32)
Calcium: 8 mg/dL — ABNORMAL LOW (ref 8.9–10.3)
Chloride: 106 mmol/L (ref 98–111)
Creatinine, Ser: 2.57 mg/dL — ABNORMAL HIGH (ref 0.61–1.24)
GFR, Estimated: 31 mL/min — ABNORMAL LOW
Glucose, Bld: 154 mg/dL — ABNORMAL HIGH (ref 70–99)
Potassium: 5 mmol/L (ref 3.5–5.1)
Sodium: 133 mmol/L — ABNORMAL LOW (ref 135–145)

## 2024-06-16 LAB — CBC
HCT: 27.3 % — ABNORMAL LOW (ref 39.0–52.0)
Hemoglobin: 9.1 g/dL — ABNORMAL LOW (ref 13.0–17.0)
MCH: 29.8 pg (ref 26.0–34.0)
MCHC: 33.3 g/dL (ref 30.0–36.0)
MCV: 89.5 fL (ref 80.0–100.0)
Platelets: 274 K/uL (ref 150–400)
RBC: 3.05 MIL/uL — ABNORMAL LOW (ref 4.22–5.81)
RDW: 15.4 % (ref 11.5–15.5)
WBC: 34.8 K/uL — ABNORMAL HIGH (ref 4.0–10.5)
nRBC: 0 % (ref 0.0–0.2)

## 2024-06-16 LAB — POCT I-STAT 7, (LYTES, BLD GAS, ICA,H+H)
Acid-base deficit: 8 mmol/L — ABNORMAL HIGH (ref 0.0–2.0)
Bicarbonate: 18.5 mmol/L — ABNORMAL LOW (ref 20.0–28.0)
Calcium, Ion: 1.12 mmol/L — ABNORMAL LOW (ref 1.15–1.40)
HCT: 27 % — ABNORMAL LOW (ref 39.0–52.0)
Hemoglobin: 9.2 g/dL — ABNORMAL LOW (ref 13.0–17.0)
O2 Saturation: 91 %
Patient temperature: 103.1
Potassium: 5.2 mmol/L — ABNORMAL HIGH (ref 3.5–5.1)
Sodium: 138 mmol/L (ref 135–145)
TCO2: 20 mmol/L — ABNORMAL LOW (ref 22–32)
pCO2 arterial: 46.1 mmHg (ref 32–48)
pH, Arterial: 7.225 — ABNORMAL LOW (ref 7.35–7.45)
pO2, Arterial: 83 mmHg (ref 83–108)

## 2024-06-16 LAB — VANCOMYCIN, RANDOM: Vancomycin Rm: 26 ug/mL

## 2024-06-16 LAB — GLUCOSE, CAPILLARY
Glucose-Capillary: 141 mg/dL — ABNORMAL HIGH (ref 70–99)
Glucose-Capillary: 142 mg/dL — ABNORMAL HIGH (ref 70–99)
Glucose-Capillary: 134 mg/dL — ABNORMAL HIGH (ref 70–99)
Glucose-Capillary: 120 mg/dL — ABNORMAL HIGH (ref 70–99)
Glucose-Capillary: 134 mg/dL — ABNORMAL HIGH (ref 70–99)
Glucose-Capillary: 132 mg/dL — ABNORMAL HIGH (ref 70–99)

## 2024-06-16 LAB — PHOSPHORUS: Phosphorus: 3.9 mg/dL (ref 2.5–4.6)

## 2024-06-16 LAB — MAGNESIUM: Magnesium: 2.8 mg/dL — ABNORMAL HIGH (ref 1.7–2.4)

## 2024-06-16 MED ORDER — VANCOMYCIN HCL IN DEXTROSE 1-5 GM/200ML-% IV SOLN
1000.0000 mg | Freq: Once | INTRAVENOUS | Status: AC
  Administered 2024-06-16: 1000 mg via INTRAVENOUS
  Filled 2024-06-16: qty 200

## 2024-06-16 NOTE — Progress Notes (Signed)
 "        [facility]  Date of Admission:  06/07/2024     Lines:  1/5-c ett 1/5-c left internal jugular cvc 1/5-c ngt    Abx: 06/13/24-c levo -- planned 5 days 06/11/24-c pcn g -- planned 2 weeks 12/31-c vanc   12/31 cefepime                                                                 Assessment: 44 yo male with ivdu (fentanyl ) admitted 12/30 with l facial droop and lue weakness found to have multifocal stroke, bilateral pulm airspace opacity, mrsa pna and tte finding of av vegetation, complicated by opiate withdrawal   Picture consistent with mrsa IE in setting ivdu. Probable given janeway vs osler's lesions on left palm/right sole   Very confused today due to withdrawal/stroke -- will need to reassess for metastatic pyogenic complication   Abd pelv chest ct no obvious spine or paraspinous involvement but if sx will need to get dedicated spine imaging   Chest airspace opacity could be aspiration or septic  pulm nodules from mrsa bsi. He is doing well on room air as of my initial visit  Micro: 12/31 bcx mrsa (S dapto) 1/1 rpr titer 1:128 1/1 hep b cAb positive; sAg negative 1/1 hcv ab positive 1/1 hiv ab negative 1/1 bcx negative 1/3 hep b dna negative 1/3 hcv rna 2.1 million units      --------- 1/5 id assessment Transferred to icu for hypoxic resp failure Started on presumed neurosyphilis tx late last week Repeat bcx negative Xr chest today multifocal bilateral airspace opacity ?acute heart failure vs aspiration pna vs septic emobolism from mrsa  06/16/24 assessment Ongoing fever 1/7 cxr worsening opacity despite staph aureus and aspiration pna coverage Tee full blown AR Wbc elevation iproving though 45 --> 35  Poor prognosis -- I do not anticipate survival this admission   Plan: Finish 2 weeks pcn g for neurosyphilis Finish 5 days levoflox for presumed aspiration pna Continue vancomycin  -- cns coverage best over  dapto Maintain contact isolation precaution Withdrawal and stroke management per primary team/neurology      Principal Problem:   Acute encephalopathy Active Problems:   MRSA bacteremia   Neurosyphilis   Acute bacterial endocarditis   Acute ischemic stroke (HCC)   Sepsis (HCC)   Acute respiratory failure with hypoxia (HCC)   Pneumonia of both lungs due to infectious organism   Opiate overdose (HCC)   Protein-calorie malnutrition, severe   Allergies[1]  Scheduled Meds:  Chlorhexidine  Gluconate Cloth  6 each Topical Daily   docusate  100 mg Per Tube BID   enoxaparin  (LOVENOX ) injection  40 mg Subcutaneous Q24H   etomidate   20 mg Intravenous Once   feeding supplement (PROSource TF20)  60 mL Per Tube Daily    morphine  injection  1 mg Intravenous Once   mupirocin  ointment  1 Application Nasal BID   mouth rinse  15 mL Mouth Rinse Q2H   pantoprazole  (PROTONIX ) IV  40 mg Intravenous Q24H   polyethylene glycol  17 g Per Tube Daily   sodium chloride  flush  10-40 mL Intracatheter Q12H   sodium chloride  flush  3 mL Intravenous Once   thiamine   100 mg Per Tube Daily  Continuous Infusions:  feeding supplement (OSMOLITE 1.5 CAL) 45 mL/hr at 06/16/24 0800   HYDROmorphone  3 mg/hr (06/16/24 1045)   midazolam  5 mg/hr (06/16/24 1045)   norepinephrine  (LEVOPHED ) Adult infusion 11 mcg/min (06/16/24 1220)   penicillin  G potassium 18 Million Units in dextrose  5 % 500 mL CONTINUOUS infusion 18 Million Units (06/16/24 1320)   propofol  (DIPRIVAN ) infusion 40 mcg/kg/min (06/16/24 0945)   vancomycin  1,000 mg (06/16/24 1322)   vasopressin  0.03 Units/min (06/16/24 1031)   PRN Meds:.acetaminophen  **OR** acetaminophen , haloperidol  lactate, HYDROmorphone , midazolam  PF, naLOXone  (NARCAN )  injection, mouth rinse, sodium chloride  flush   SUBJECTIVE: Ongoing fever Comatose Family tranistioning to comfort care   Review of Systems: ROS All other ROS was negative, except mentioned  above     OBJECTIVE: Vitals:   06/16/24 1130 06/16/24 1145 06/16/24 1156 06/16/24 1200  BP: (!) 122/48 (!) 114/48  (!) 111/44  Pulse: (!) 105 (!) 106  (!) 104  Resp: (!) 29 (!) 27  (!) 26  Temp:   (!) 102.5 F (39.2 C)   TempSrc:   Oral   SpO2: 96% 98%  97%  Weight:      Height:       Body mass index is 22.05 kg/m.  Physical Exam General/constitutional: acutely ill appearing; intubated sedated/comatose HEENT: Normocephalic, PER CV: tachy, heaving heart beat; systolic murmur Lungs: coarse on vent Abd: Soft Ext: no edema Skin: petehciael changes left palm and right sole Neuro: intubated sedated MSK: no peripheral joint swelling/tenderness/warmth   Central line presence: left internal jugular cvc site no bleeding   Lab Results Lab Results  Component Value Date   WBC 34.8 (H) 06/16/2024   HGB 9.1 (L) 06/16/2024   HCT 27.3 (L) 06/16/2024   MCV 89.5 06/16/2024   PLT 274 06/16/2024    Lab Results  Component Value Date   CREATININE 2.57 (H) 06/16/2024   BUN 67 (H) 06/16/2024   NA 133 (L) 06/16/2024   K 5.0 06/16/2024   CL 106 06/16/2024   CO2 17 (L) 06/16/2024    Lab Results  Component Value Date   ALT 44 06/14/2024   AST 45 (H) 06/14/2024   GGT 68 (H) 06/08/2024   ALKPHOS 99 06/14/2024   BILITOT 0.9 06/14/2024      Microbiology: Recent Results (from the past 240 hours)  Blood culture (routine x 2)     Status: Abnormal (Preliminary result)   Collection Time: 06/08/24 12:35 AM   Specimen: BLOOD RIGHT ARM  Result Value Ref Range Status   Specimen Description BLOOD RIGHT ARM  Final   Special Requests   Final    BOTTLES DRAWN AEROBIC AND ANAEROBIC Blood Culture adequate volume   Culture  Setup Time   Final    GRAM POSITIVE COCCI IN CLUSTERS IN BOTH AEROBIC AND ANAEROBIC BOTTLES CRITICAL RESULT CALLED TO, READ BACK BY AND VERIFIED WITH: PHARMD J.FRENS AT 1213 ON 06/08/2024 BY T.SAAD.    Culture (A)  Final    METHICILLIN RESISTANT STAPHYLOCOCCUS  AUREUS Sent to [facility] for further susceptibility testing. Performed at [facility], [address], [place], [place] [address]    Report Status PENDING  Incomplete   Organism ID, Bacteria METHICILLIN RESISTANT STAPHYLOCOCCUS AUREUS  Final      Susceptibility   Methicillin resistant staphylococcus aureus - MIC*    CIPROFLOXACIN >=8 RESISTANT Resistant     ERYTHROMYCIN >=8 RESISTANT Resistant     GENTAMICIN <=0.5 SENSITIVE Sensitive     OXACILLIN >=4 RESISTANT Resistant  TETRACYCLINE <=1 SENSITIVE Sensitive     VANCOMYCIN  1 SENSITIVE Sensitive     TRIMETH/SULFA >=320 RESISTANT Resistant     CLINDAMYCIN >=8 RESISTANT Resistant     RIFAMPIN <=0.5 SENSITIVE Sensitive     Inducible Clindamycin NEGATIVE Sensitive     LINEZOLID 2 SENSITIVE Sensitive     * METHICILLIN RESISTANT STAPHYLOCOCCUS AUREUS  Blood Culture ID Panel (Reflexed)     Status: Abnormal   Collection Time: 06/08/24 12:35 AM  Result Value Ref Range Status   Enterococcus faecalis NOT DETECTED NOT DETECTED Final   Enterococcus Faecium NOT DETECTED NOT DETECTED Final   Listeria monocytogenes NOT DETECTED NOT DETECTED Final   Staphylococcus species DETECTED (A) NOT DETECTED Final    Comment: CRITICAL RESULT CALLED TO, READ BACK BY AND VERIFIED WITH: PHARMD J.FRENS AT 1213 ON 06/08/2024 BY T.SAAD.    Staphylococcus aureus (BCID) DETECTED (A) NOT DETECTED Final    Comment: Methicillin (oxacillin)-resistant Staphylococcus aureus (MRSA). MRSA is predictably resistant to beta-lactam antibiotics (except ceftaroline). Preferred therapy is vancomycin  unless clinically contraindicated. Patient requires contact precautions if  hospitalized. CRITICAL RESULT CALLED TO, READ BACK BY AND VERIFIED WITH: PHARMD J.FRENS AT 1213 ON 06/08/2024 BY T.SAAD.    Staphylococcus epidermidis NOT DETECTED NOT DETECTED Final   Staphylococcus lugdunensis NOT DETECTED NOT DETECTED Final   Streptococcus species NOT DETECTED NOT DETECTED Final    Streptococcus agalactiae NOT DETECTED NOT DETECTED Final   Streptococcus pneumoniae NOT DETECTED NOT DETECTED Final   Streptococcus pyogenes NOT DETECTED NOT DETECTED Final   A.calcoaceticus-baumannii NOT DETECTED NOT DETECTED Final   Bacteroides fragilis NOT DETECTED NOT DETECTED Final   Enterobacterales NOT DETECTED NOT DETECTED Final   Enterobacter cloacae complex NOT DETECTED NOT DETECTED Final   Escherichia coli NOT DETECTED NOT DETECTED Final   Klebsiella aerogenes NOT DETECTED NOT DETECTED Final   Klebsiella oxytoca NOT DETECTED NOT DETECTED Final   Klebsiella pneumoniae NOT DETECTED NOT DETECTED Final   Proteus species NOT DETECTED NOT DETECTED Final   Salmonella species NOT DETECTED NOT DETECTED Final   Serratia marcescens NOT DETECTED NOT DETECTED Final   Haemophilus influenzae NOT DETECTED NOT DETECTED Final   Neisseria meningitidis NOT DETECTED NOT DETECTED Final   Pseudomonas aeruginosa NOT DETECTED NOT DETECTED Final   Stenotrophomonas maltophilia NOT DETECTED NOT DETECTED Final   Candida albicans NOT DETECTED NOT DETECTED Final   Candida auris NOT DETECTED NOT DETECTED Final   Candida glabrata NOT DETECTED NOT DETECTED Final   Candida krusei NOT DETECTED NOT DETECTED Final   Candida parapsilosis NOT DETECTED NOT DETECTED Final   Candida tropicalis NOT DETECTED NOT DETECTED Final   Cryptococcus neoformans/gattii NOT DETECTED NOT DETECTED Final   Meth resistant mecA/C and MREJ DETECTED (A) NOT DETECTED Final    Comment: CRITICAL RESULT CALLED TO, READ BACK BY AND VERIFIED WITH: PHARMD J.FRENS AT 1213 ON 06/08/2024 BY T.SAAD. Performed at [facility], [address] [place], [place] [address]   MIC (1 Drug)-     Status: Abnormal   Collection Time: 06/08/24 12:35 AM  Result Value Ref Range Status   Min Inhibitory Conc (1 Drug) Final report (A)  Corrected    Comment: (NOTE) Performed At: [facility] [address] [place], [place] [id number] Jennette Shorter MD Ey:[id number] CORRECTED ON 01/05 AT 1636: PREVIOUSLY REPORTED AS Preliminary report    Source [id number] MRSA BLOOD DAPTOMYCIN  Final    Comment: Performed at [facility], [address] [place], [place] [address]  MIC Result     Status: Abnormal   Collection Time: 06/08/24 12:35 AM  Result Value Ref Range Status   Result 1 (MIC) Comment (A)  Final    Comment: (NOTE) Methicillin - resistant Staphylococcus aureus Identification performed by account, not confirmed by this laboratory. DAPTOMYCIN .50ug/ml SUSCPETIBLE Performed At: [facility] [address] [place], [place] [id number] Jennette Shorter MD Ey:[id number]   Blood culture (routine x 2)     Status: Abnormal   Collection Time: 06/08/24 12:40 AM   Specimen: BLOOD LEFT ARM  Result Value Ref Range Status   Specimen Description BLOOD LEFT ARM  Final   Special Requests   Final    BOTTLES DRAWN AEROBIC AND ANAEROBIC Blood Culture adequate volume   Culture  Setup Time   Final    GRAM POSITIVE COCCI IN CLUSTERS IN BOTH AEROBIC AND ANAEROBIC BOTTLES CRITICAL VALUE NOTED.  VALUE IS CONSISTENT WITH PREVIOUSLY REPORTED AND CALLED VALUE.    Culture (A)  Final    STAPHYLOCOCCUS AUREUS SUSCEPTIBILITIES PERFORMED ON PREVIOUS CULTURE WITHIN THE LAST 5 DAYS. Performed at [facility], [address] [place], [place] [address]    Report Status 06/10/2024 FINAL  Final  Resp panel by RT-PCR (RSV, Flu A&B, Covid) Anterior Nasal Swab     Status: None   Collection Time: 06/08/24  3:54 AM   Specimen: Anterior Nasal Swab  Result Value Ref Range Status   SARS Coronavirus 2 by RT PCR NEGATIVE NEGATIVE Final   Influenza A by PCR NEGATIVE NEGATIVE Final   Influenza B by PCR NEGATIVE NEGATIVE Final    Comment: (NOTE) The Xpert Xpress SARS-CoV-2/FLU/RSV plus assay is intended as an aid in the diagnosis of influenza from Nasopharyngeal swab specimens and should not be used as a sole basis for treatment. Nasal washings  and aspirates are unacceptable for Xpert Xpress SARS-CoV-2/FLU/RSV testing.  Fact Sheet for Patients: bloggercourse.com  Fact Sheet for Healthcare Providers: seriousbroker.it  This test is not yet approved or cleared by the [place]  FDA and has been authorized for detection and/or diagnosis of SARS-CoV-2 by FDA under an Emergency Use Authorization (EUA). This EUA will remain in effect (meaning this test can be used) for the duration of the COVID-19 declaration under Section 564(b)(1) of the Act, 21 U.S.C. section 360bbb-3(b)(1), unless the authorization is terminated or revoked.     Resp Syncytial Virus by PCR NEGATIVE NEGATIVE Final    Comment: (NOTE) Fact Sheet for Patients: bloggercourse.com  Fact Sheet for Healthcare Providers: seriousbroker.it  This test is not yet approved or cleared by the [place]  FDA and has been authorized for detection and/or diagnosis of SARS-CoV-2 by FDA under an Emergency Use Authorization (EUA). This EUA will remain in effect (meaning this test can be used) for the duration of the COVID-19 declaration under Section 564(b)(1) of the Act, 21 U.S.C. section 360bbb-3(b)(1), unless the authorization is terminated or revoked.  Performed at [facility], [address], [place], [place] [address]   Culture, blood (Routine X 2) w Reflex to ID Panel     Status: None   Collection Time: 06/09/24  5:36 AM   Specimen: BLOOD  Result Value Ref Range Status   Specimen Description BLOOD SITE NOT SPECIFIED  Final   Special Requests   Final    BOTTLES DRAWN AEROBIC AND ANAEROBIC Blood Culture results may not be optimal due to an inadequate volume of blood received in culture bottles   Culture   Final  NO GROWTH 5 DAYS Performed at [facility], [address] [place], [place] [address]    Report Status 06/14/2024 FINAL  Final   Culture, blood (Routine X 2) w Reflex to ID Panel     Status: None   Collection Time: 06/09/24  5:36 AM   Specimen: BLOOD  Result Value Ref Range Status   Specimen Description BLOOD SITE NOT SPECIFIED  Final   Special Requests   Final    BOTTLES DRAWN AEROBIC AND ANAEROBIC Blood Culture adequate volume   Culture   Final    NO GROWTH 5 DAYS Performed at [facility], [address] [place], [place] [address]    Report Status 06/14/2024 FINAL  Final  MRSA Next Gen by PCR, Nasal     Status: Abnormal   Collection Time: 06/13/24  3:53 PM   Specimen: Nasal Mucosa; Nasal Swab  Result Value Ref Range Status   MRSA by PCR Next Gen DETECTED (A) NOT DETECTED Final    Comment: RESULT CALLED TO, READ BACK BY AND VERIFIED WITH: RN WENDI DONOVAN [phone] AT 2019, ADC (NOTE) The GeneXpert MRSA Assay (FDA approved for NASAL specimens only), is one component of a comprehensive MRSA colonization surveillance program. It is not intended to diagnose MRSA infection nor to guide or monitor treatment for MRSA infections. Test performance is not FDA approved in patients less than 61 years old. Performed at [facility], [address], [place], [place] [address]   Culture, blood (Routine X 2) w Reflex to ID Panel     Status: None (Preliminary result)   Collection Time: 06/14/24 11:47 AM   Specimen: BLOOD RIGHT HAND  Result Value Ref Range Status   Specimen Description BLOOD RIGHT HAND  Final   Special Requests   Final    BOTTLES DRAWN AEROBIC AND ANAEROBIC Blood Culture adequate volume   Culture   Final    NO GROWTH 2 DAYS Performed at [facility], [address] [place], [place] [address]    Report Status PENDING  Incomplete  Culture, blood (Routine X 2) w Reflex to ID Panel     Status: None (Preliminary result)   Collection Time: 06/14/24  2:09 PM   Specimen: BLOOD LEFT HAND  Result Value Ref Range Status   Specimen Description BLOOD LEFT HAND  Final   Special Requests   Final     BOTTLES DRAWN AEROBIC AND ANAEROBIC Blood Culture adequate volume   Culture   Final    NO GROWTH 2 DAYS Performed at [facility], [address], [place], [place] [address]    Report Status PENDING  Incomplete     Serology:   Imaging: If present, new imagings (plain films, ct scans, and mri) have been personally visualized and interpreted; radiology reports have been reviewed. Decision making incorporated into the Impression / Recommendations.  12/31 ct c/a/p 1. Right lower lobe posterior basal consolidation and additional right upper and middle lobe peripheral consolidations, most consistent with multifocal pneumonia. Additional considerations regarding the scattered areas of peripheral consolidation within the right upper and right middle lobe are as described above, and correlation with the patient's immunocompetency status is warranted. 2. Diffuse bronchial wall thickening with moderate mucus impaction in right lower lobe segmental bronchi, consistent with airway inflammation. 3. Hepatosplenomegaly, new from prior examination. 4. Indeterminate noncalcified left upper lobe pulmonary nodule, with recommendation for non-contrast chest CT at 3-6 months and then consideration of non-contrast chest CT  at 18-24 months as per Fleischner Society Guidelines. 5. Marked atrophy of the right kidney with compensatory hypertrophy of the left kidney. 6. Moderate sigmoid diverticulosis without acute inflammatory change. 7. Mild coronary artery calcification and mild aortoiliac atherosclerotic calcification. 8. Partially visualized moderate right scrotal hydrocele.  12/31 mri brain 1. Acute/subacute infarction at the right frontoparietal junction/insular region (right MCA territory) with associated hemosiderin staining, most compatible with mild hemorrhagic transformation/oozing. 2. Acute infarct in the left cerebellar hemisphere. 3. Small acute lacunar infarcts in the left temporal  lobe. 4. Additional focus in the anteromedial right frontal subcortical white matter, compatible with an additional acute infarct. 5. Blooming artifact in the posteromedial right occipital lobe, most consistent with a small chronic hemorrhagic focus (e.g., microhemorrhage). 6. Mucosal disease in the ethmoid and maxillary sinuses.  1/5 cxr FINDINGS: The patient is intubated. The tip of the endotracheal tube is 7.2 cm above the carina. Increased pulmonary vascular congestion without overt edema. New left mid and bilateral lower lobe patchy airspace opacities. Possible small left pleural effusion.   IMPRESSION: Multifocal patchy airspace opacities including both mid lungs and both lower lobes consistent with multifocal pneumonia.   Endotracheal tube is in good position 7.2 cm above the carina.   Possible small left pleural effusion.    1/6 tee  1. Left ventricular ejection fraction, by estimation, is 60 to 65%. The  left ventricle has normal function. The left ventricle has no regional  wall motion abnormalities. Left ventricular diastolic function could not  be evaluated.   2. Right ventricular systolic function is normal. The right ventricular  size is normal.   3. No left atrial/left atrial appendage thrombus was detected.   4. The mitral valve is grossly normal. Mild mitral valve regurgitation.   5. Moderate size aortic vegetation largely adherent to the noncoronary  cusp with leaflet disctruction and severe AR. The aortic valve is  tricuspid. Aortic valve regurgitation is severe.   6. 3D performed of the aortic valve and demonstrates Moderate size  vegetation.     1/7 cxr Support lines and tubes as above. Worsening heterogeneous airspace disease in the lower lungs suspect for pneumonia.    Constance ONEIDA Passer, MD [facility] [phone] pager    06/16/2024, 1:54 PM      [1] No Known Allergies  "

## 2024-06-16 NOTE — Progress Notes (Signed)
 Pharmacy Antibiotic Note  Patrick [name] is a 44 y.o. 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  Pharmacy has been consulted for vancomycin  dosing.   Vancomycin  kinetic parameters:  1/5: ke = 0.123, half life ~5.6 hours, Vd ~51.6 L 1/6: ke ~0.075  1/7: Ke ~0.0206, half life ~34 hours  Vancomycin  levels: 06/13/2024 vancomycin  trough (collected at 0834) = 10 mcg/mL 06/14/2024 vancomycin  random (collected at 0838) = 34 mcg/mL 06/15/2024 vancomycin  random (collected at 1420) = 35 mcg/mL  06/16/2024 vancomycin  random (collected at 0444) = 26 mcg/mL   Renal markers continue to increase (Scr 2.57 today) and vancomycin  random level of 26 mcg/mL is still elevated but has trended down. Last dose given was vancomycin  1000 mg at 0256 on 06/15/2024. Will give a 1x dose of vancomycin  1000 mg today and trend renal function.   Plan: Vancomycin  1000 mg x1 dose today  Future doses will be guided based on renal function trend and vancomycin  levels as needed Trend WBC, temp, renal function, clinical progress, and goals of care   Height: 5' 11 (180.3 cm) Weight: 71.7 kg (158 lb 1.1 oz) IBW/kg (Calculated) : 75.3  Temp (24hrs), Avg:101.8 F (38.8 C), Min:99.7 F (37.6 C), Max:103.2 F (39.6 C)  Recent Labs  Lab 06/10/24 0148 06/10/24 0958 06/12/24 0616 06/13/24 0200 06/13/24 0223 06/13/24 0834 06/14/24 0433 06/14/24 0838 06/15/24 1133 06/15/24 1420 06/16/24 0444  WBC 23.1*   < > 29.0*  --  26.4*  --  45.0*  --  37.3*  --  34.8*  CREATININE 0.72   < > 0.80  --  0.74  --  1.32*  --  2.39*  --  2.57*  VANCOTROUGH  --    < >  --   --  DISREGARD TEST RESULTS, TEST WILL BE CREDITED. TEST DRAWN BEFORE REQUESTED TIME. 10*  --   --   --   --   --   VANCOPEAK 21*  --   --  42*  --   --   --   --   --   --   --   VANCORANDOM  --   --   --   --   --   --   --    < >  --  35 26   < > = values in this interval not displayed.    Estimated  Creatinine Clearance: 38 mL/min (A) (by C-G formula based on SCr of 2.57 mg/dL (H)).    Allergies[1]  Medications this admission:  Cefepime  12/31 x1 dose Vanc 12/31 >> Penicillin  G 1/2 >> Levofloxacin  1/5 >> (1/9)   Microbiology: 12/31 bcx: 4/4 MRSA 12/31 RVP: neg 1/1 BCx: ngtd x4 days 1/6 Bcx: NGTD  Thank you for involving pharmacy in this patient's care.  Feliciano Close, PharmD PGY2 Infectious Diseases Pharmacy Resident       [1] No Known Allergies

## 2024-06-16 NOTE — Progress Notes (Addendum)
 "                                                                                                                                                                                                          Daily Progress Note   Patient Name: Patrick [name]       Date: 06/16/2024 DOB: 1981/04/06  Age: 44 y.o. MRN#: [id number] Attending Physician: Pleas Newborn, MD Primary Care Physician: Patient, No Pcp Per Admit Date: 06/07/2024  Reason for Follow-up: Withdrawal of life-sustaining treatment  Patient Profile/HPI:  44 y.o. male  with past medical history of arthritis, polysubstance abuse including IV drug use admitted on 06/07/2024 with presentation of altered mental status. Workup revealed R MCA stroke.  There was suspicion for drug overdose and he received narcan , UDS from 12/30 reviewed and positive for fentanyl  and amphetamines. On initial presentation he was lethargic but able to answer questions with dysarthria. Additional workup revealed MRSA bacteremia and AV vegetation, and pneumonia, neurosyphilis, Hep B and Hep C. He had decline during admission and required intubation and initiation of IV pressor support on 1/5 for worsening ARDS. TEE on 1/6 revealed large AV vegetation and severe aortic regurgitation. Cardiothoracic surgery was consulted. He is not a candidate for surgery- comfort and hospice has been recommended. Palliative consulted to assist with complex medical decision making.   Subjective: Chart reviewed.  Noted per pulmonology note that family has decided to wait until Saturday for extubation to comfort, but desire for patient to continue to be comfortable.  Discussed with bedside RN. Family has not been present today. Daughter has shared she does not want to be present for extubation. Patient's brother to be present.  CMET today shows Cr is continuing to trend up 2.57 today.  CBC today with persistent leucocytosis 34.8. Patient continues on IV propofol , dilaudid , and versed . He  continues to be tachycardic.  On evaluation he appears comfortable.   Review of Systems  Unable to perform ROS: Intubated     Physical Exam Vitals and nursing note reviewed.  Cardiovascular:     Rate and Rhythm: Tachycardia present.  Pulmonary:     Comments: intubated Neurological:     Comments: sedated             Vital Signs: BP (!) 111/44   Pulse (!) 104   Temp (!) 102.5 F (39.2 C) (Oral)   Resp (!) 26   Ht 5' 11 (1.803 m)   Wt 71.7 kg   SpO2 97%   BMI 22.05 kg/m  SpO2: SpO2: 97 % O2 Device:  O2 Device: Ventilator O2 Flow Rate: O2 Flow Rate (L/min): 50 L/min  Intake/output summary:  Intake/Output Summary (Last 24 hours) at 06/16/2024 1347 Last data filed at 06/16/2024 0800 Gross per 24 hour  Intake 2194.1 ml  Output 1625 ml  Net 569.1 ml   LBM: Last BM Date : 06/15/24 Baseline Weight: Weight: 79.2 kg Most recent weight: Weight: 71.7 kg       Palliative Assessment/Data: PPS: 10% (with OG feeding tube in place)      Patient Active Problem List   Diagnosis Date Noted   Protein-calorie malnutrition, severe 06/15/2024   Acute ischemic stroke (HCC) 06/13/2024   Sepsis (HCC) 06/13/2024   Acute respiratory failure with hypoxia (HCC) 06/13/2024   Pneumonia of both lungs due to infectious organism 06/13/2024   Opiate overdose (HCC) 06/13/2024   Neurosyphilis 06/10/2024   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   MRSA bacteremia 06/09/2024   Acute encephalopathy 06/08/2024    Palliative Care Assessment & Plan    Assessment/Recommendations/Plan  Multiple comorbities- infective endocarditis, ARDS, MRSA bacteremia, stroke, ventilator dependent, no good options to treat his aortic vegetation- not a candidate for surgery, acute ischemic stoke Plan is to transition to comfort measures and extubate to comfort on Saturday- given his symptom burden, comfort medication requirements, tachycardia and increasing Cr, I am concerned he may not survive until Saturday  despite all current efforts- attempted to call daughter- message left requesting return call  Addendum- patient's daughter returned my call. Emotional support provided as she discussed the difficulty she feels in being responsible for her Dad's decision making and also struggling with family dynamics.  She is very concerned that her Dad remain comfortable. She is requesting extubation on Saturday to give other family members time to process reality of the situation and to visit.  I did discuss my concerns with her that he may die before Saturday despite all current interventions and she verbalized understanding.   Code Status:   Code Status: Do not attempt resuscitation (DNR) - Comfort care    Thank you for allowing the Palliative Medicine Team to assist in the care of this patient.  I personally spent a total of  60 minutes in the care of the patient today including preparing to see the patient, getting/reviewing separately obtained history, performing a medically appropriate exam/evaluation, referring and communicating with other health care professionals, and coordinating care.  Cassondra Stain, AGNP-C Palliative Medicine   Please contact Palliative Medicine Team phone at [phone] for questions and concerns.        "

## 2024-06-16 NOTE — Progress Notes (Signed)
 Nutrition Brief Note  Chart reviewed. Pt now transitioning to comfort care.  No further nutrition interventions planned at this time.  Please re-consult as needed.   Vernell Lukes, RD, LDN, CNSC Registered Dietitian II Please reach out via secure chat

## 2024-06-16 NOTE — Progress Notes (Signed)
 "  NAME:  Patrick [name], MRN:  [id number], DOB:  [birth year], LOS: 8 ADMISSION DATE:  06/07/2024 CONSULTATION DATE:  06/13/2024 REFERRING MD:  Vianne - TRH, CHIEF COMPLAINT:  Acute respiratory failure   History of Present Illness:  44 year old man who presented to [facility] 12/30 as a Code Stroke with L facial droop, L arm weakness and slurred speech. PMHx significant for arthritis, polysubstance abuse including IVDU.  On ED arrival, patient was altered with LKW ~0100 12/30. Unresponsive requiring BVM ventilations. Responsiveness improved with Narcan  administration, but ongoing facial asymmetry was noted. Labs were notable for WBC 20.1, Hgb 15.1, Plt 108. INR 1.3. Na 128, K 4.5, CO2 25, BUN/Cr 36/1.00. AST/ALT 144/71, Alk Phos 181, Tbili 0.9. Ethanol negative, ammonia 28. UDS +amphetamines, fentanyl . VBG pH 7.474/pCO2 36.6/bicarb 26.9. LA 2.5. PCT 3.05 BCID +MRSA. COVID/Flu/RSV negative. CT Head 12/30 showed intermediate-sized area of hypoattenuation within the R MCA territory c/w acute ischemic changes. CTA Head/Neck 12/31 negative for LVO, +atherosclerosis. CT Chest/A/P 12/31 showed multifocal PNA, diffuse bronchial wall thickening with moderate mucous impaction RLL, hepatosplenomegaly, R kidney atrophy, diverticulosis. MRI Brain demonstrated acute/subacute infarction at the R frontoparietal junction (R MCA territory) with mild HT, small areas of additional infarct/microhemorrhage. Ultimately admitted to [facility] service. TTE with concern for AV vegetation; ID was consulted for possible IE.  Cards was consulted for TEE.  On 1/5, Rapid Response was called for respiratory distress with tachypnea to 40s-50s, tachycardia to 120s, and SpO2 89% with increased WOB. PCCM was consulted and patient was transferred to ICU. Intubated on arrival to ICU.  Pertinent Medical History:   Past Medical History:  Diagnosis Date   Arthritis    IVDU (intravenous drug use)    MRSA bacteremia    Polysubstance abuse (HCC)     Significant Hospital Events: Including procedures, antibiotic start and stop dates in addition to other pertinent events   12/30 - Presented to ED as Code Stroke. 12/31 - TTE c/f AV vegetation/infective endocarditis, ID consulted, Cards consulted for TEE. 1/5 - Rapid Response for tachypnea, tachycardia, increased WOB. PCCM consulted. Transferred to ICU. Intubated. Levophed  initiated.   Interim History / Subjective:   Had GOC with daughter and extended family yesterday Family has decided to make him comfort care but would like to pursue full comfort measures and withdrawal on Saturday when everyone can be here Pt on dilaudid , versed  and propofol  drip. Versed  added yesterday evening for agitation and tachypnea Pt remains persistently febrile  Objective:  Blood pressure (!) 111/44, pulse (!) 104, temperature (!) 102.5 F (39.2 C), temperature source Oral, resp. rate (!) 26, height 5' 11 (1.803 m), weight 71.7 kg, SpO2 97%.    Vent Mode: PRVC FiO2 (%):  [40 %-100 %] 40 % Set Rate:  [20 bmp] 20 bmp Vt Set:  [620 mL] 620 mL PEEP:  [5 cmH20-8 cmH20] 5 cmH20 Plateau Pressure:  [13 cmH20] 13 cmH20   Intake/Output Summary (Last 24 hours) at 06/16/2024 1300 Last data filed at 06/16/2024 0800 Gross per 24 hour  Intake 2194.1 ml  Output 1625 ml  Net 569.1 ml   Filed Weights   06/07/24 2338 06/09/24 0444 06/10/24 0417  Weight: 79.2 kg 74.3 kg 71.7 kg    Physical Examination:  General: Acute-on-chronically ill-appearing middle-aged man in NAD. HEENT: [place]/AT, anicteric sclera, PERRL 2mm, moist mucous membranes. Neuro: Intubated, sedated. Does not respond to verbal, tactile or noxious stimuli. Not following commands. +Corneal, +Cough, and +Gag  CV: Tachycardic , regular rhythm, ?systolic murmur PULM: Lung fields  coarse throughout, most notably over bilateral bases. GI: Soft, nontender, nondistended. Normoactive bowel sounds. Extremities: No LE edema noted. Skin: Warm/dry, +track  marks.  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 Continue current sedatives for comfort - TEE showed large AV vegetations and new severe AR- consulted cardiothoracic surgery- not a surgical candidate - d/w family yesterday and plans for withdrawal on Saturday - spoke to daughter today, she is requesting he stay pain free till then. No escalation of care. - continue levophed  and vasopressin  to keep MAP >65 - continue antibiotics as per ID recs for now  PPI and DVT till tomorrow   Labs:  CBC: Recent Labs  Lab 06/12/24 0616 06/13/24 0223 06/13/24 1547 06/14/24 0433 06/14/24 1215 06/15/24 1133 06/15/24 1137 06/16/24 0444  WBC 29.0* 26.4*  --  45.0*  --  37.3*  --  34.8*  HGB 11.7* 11.4*   < > 10.7* 11.2* 9.3* 9.5* 9.1*  HCT 34.0* 34.4*   < > 32.1* 33.0* 27.7* 28.0* 27.3*  MCV 84.2 88.4  --  89.4  --  89.4  --  89.5  PLT 187 137*  --  248  --  208  --  274   < > = values in this interval not displayed.   Basic Metabolic Panel: Recent Labs  Lab 06/12/24 0616 06/13/24 0223 06/13/24 1547 06/14/24 0433 06/14/24 1215 06/14/24 1524 06/15/24 0500 06/15/24 1133 06/15/24 1137 06/16/24 0444  NA 136 137   < > 136 139  --   --  133* 136 133*  K 3.9 4.0   < > 3.9 4.6  --   --  4.9 4.8 5.0  CL 107 108  --  106  --   --   --  105  --  106  CO2 19* 16*  --  18*   --   --   --  17*  --  17*  GLUCOSE 139* 127*  --  145*  --   --   --  145*  --  154*  BUN 26* 29*  --  45*  --   --   --  58*  --  67*  CREATININE 0.80 0.74  --  1.32*  --   --   --  2.39*  --  2.57*  CALCIUM 7.8* 7.8*  --  7.8*  --   --   --  8.0*  --  8.0*  MG 2.3 2.3  --  2.4  --   --  2.4  --   --  2.8*  PHOS 3.5 3.6  --   --   --  3.7 4.1  --   --  3.9   < > = values in this interval not displayed.   GFR: Estimated Creatinine Clearance: 38 mL/min (A) (by C-G formula based on SCr of 2.57 mg/dL (H)). Recent Labs  Lab 06/13/24 0223 06/14/24 0433 06/15/24 1133 06/16/24 0444  WBC  26.4* 45.0* 37.3* 34.8*   Liver Function Tests: Recent Labs  Lab 06/10/24 0148 06/11/24 0543 06/12/24 0616 06/13/24 0223 06/14/24 0433  AST 101* 66* 58* 68* 45*  ALT 68* 61* 44 52* 44  ALKPHOS 208* 172* 148* 118 99  BILITOT 1.7* 1.6* 1.4* 1.1 0.9  PROT 6.8 7.4 7.8 7.8 7.4  ALBUMIN 2.7* 2.8* 2.7* 2.7* 2.7*   No results for input(s): LIPASE, AMYLASE in the last 168 hours. No results for input(s): AMMONIA in the last 168 hours.  ABG:    Component Value Date/Time   PHART 7.336 (L) 06/15/2024 1137   PCO2ART 30.3 (L) 06/15/2024 1137   PO2ART 81 (L) 06/15/2024 1137   HCO3 16.0 (L) 06/15/2024 1137   TCO2 17 (L) 06/15/2024 1137   ACIDBASEDEF 8.0 (H) 06/15/2024 1137   O2SAT 94 06/15/2024 1137    Coagulation Profile: No results for input(s): INR, PROTIME in the last 168 hours.  Cardiac Enzymes: No results for input(s): CKTOTAL, CKMB, CKMBINDEX, TROPONINI in the last 168 hours.  HbA1C: Hgb A1c MFr Bld  Date/Time Value Ref Range Status  06/08/2024 09:03 AM 6.0 (H) 4.8 - 5.6 % Final    Comment:    (NOTE) Diagnosis of Diabetes The following HbA1c ranges recommended by the American Diabetes Association (ADA) may be used as an aid in the diagnosis of diabetes mellitus.  Hemoglobin             Suggested A1C NGSP%              Diagnosis  <5.7                   Non  Diabetic  5.7-6.4                Pre-Diabetic  >6.4                   Diabetic  <7.0                   Glycemic control for                       adults with diabetes.     CBG: Recent Labs  Lab 06/15/24 1933 06/15/24 2314 06/16/24 0322 06/16/24 0729 06/16/24 1123  GLUCAP 150* 127* 141* 142* 134*   Review of Systems:   Patient is encephalopathic and/or intubated; therefore, history has been obtained from chart review.   Past Medical History:  He,  has a past medical history of Arthritis, IVDU (intravenous drug use), MRSA bacteremia, and Polysubstance abuse (HCC).   Surgical History:  No past surgical history on file.   Social History:   reports that he has been smoking. He does not have any smokeless tobacco history on file. He reports current alcohol  use. He reports that he does not use drugs.   Family History:  His family history is not on file.   Allergies: Allergies[1]   Home Medications: Prior to Admission medications  Medication Sig Start Date End Date Taking? Authorizing Provider  acetaminophen  (TYLENOL ) 500 MG tablet Take 1,000 mg by mouth every 6 (six) hours as needed for moderate pain.   Yes [provider]  albuterol  (PROVENTIL  HFA;VENTOLIN  HFA) 108 (90 Base) MCG/ACT inhaler Inhale 2 puffs into the lungs every 6 (six) hours as needed for wheezing or shortness of breath.   Yes [provider]  amoxicillin-clavulanate (AUGMENTIN) 875-125 MG tablet Take 1 tablet by mouth 2 (two) times daily.  Patient not taking: Reported on 06/08/2024 06/07/24   [provider]    Critical care time:   The patient is critically ill with multiple organ system failure and requires high complexity decision making for assessment and support, frequent evaluation and titration of therapies, advanced monitoring, review of radiographic studies and interpretation of complex data.   Critical Care Time devoted to patient care services, exclusive of separately  billable procedures, described in this note is 31 minutes.  Menucha Dicesare Pleas, MD  Pulmonary & Critical Care 06/16/2024 1:00 PM  Please see [email or web address] for pager details.  From 7A-7P if no response, please call [phone] After hours, please call ELink [phone]    [1] No Known Allergies  "

## 2024-06-16 NOTE — Progress Notes (Signed)
 SLP Cancellation Note  Patient Details Name: Patrick [name] MRN: [id number] DOB: 1980/07/14   Cancelled treatment:       Reason Eval/Treat Not Completed: Medical issues which prohibited therapy. Due to medical decline and plan to transition to comfort measures, SLP will sign off. Please re-consult if needs arise.    [name], M.A., CCC-SLP Speech Language Pathology, Acute Rehabilitation Services  Secure Chat preferred [phone]  06/16/2024, 9:14 AM

## 2024-06-17 DIAGNOSIS — R578 Other shock: Secondary | ICD-10-CM | POA: Diagnosis not present

## 2024-06-17 DIAGNOSIS — I639 Cerebral infarction, unspecified: Secondary | ICD-10-CM | POA: Diagnosis not present

## 2024-06-17 DIAGNOSIS — J15212 Pneumonia due to Methicillin resistant Staphylococcus aureus: Secondary | ICD-10-CM | POA: Diagnosis not present

## 2024-06-17 DIAGNOSIS — J9601 Acute respiratory failure with hypoxia: Secondary | ICD-10-CM | POA: Diagnosis not present

## 2024-06-17 DIAGNOSIS — Z515 Encounter for palliative care: Secondary | ICD-10-CM | POA: Diagnosis not present

## 2024-06-17 DIAGNOSIS — A523 Neurosyphilis, unspecified: Secondary | ICD-10-CM | POA: Diagnosis not present

## 2024-06-17 DIAGNOSIS — F1991 Other psychoactive substance use, unspecified, in remission: Secondary | ICD-10-CM | POA: Diagnosis not present

## 2024-06-17 DIAGNOSIS — G9340 Encephalopathy, unspecified: Secondary | ICD-10-CM | POA: Diagnosis not present

## 2024-06-17 LAB — BASIC METABOLIC PANEL WITH GFR
Anion gap: 9 (ref 5–15)
BUN: 72 mg/dL — ABNORMAL HIGH (ref 6–20)
CO2: 19 mmol/L — ABNORMAL LOW (ref 22–32)
Calcium: 7.8 mg/dL — ABNORMAL LOW (ref 8.9–10.3)
Chloride: 105 mmol/L (ref 98–111)
Creatinine, Ser: 2.51 mg/dL — ABNORMAL HIGH (ref 0.61–1.24)
GFR, Estimated: 32 mL/min — ABNORMAL LOW
Glucose, Bld: 143 mg/dL — ABNORMAL HIGH (ref 70–99)
Potassium: 5.5 mmol/L — ABNORMAL HIGH (ref 3.5–5.1)
Sodium: 133 mmol/L — ABNORMAL LOW (ref 135–145)

## 2024-06-17 LAB — CULTURE, BLOOD (ROUTINE X 2): Special Requests: ADEQUATE

## 2024-06-17 LAB — GLUCOSE, CAPILLARY
Glucose-Capillary: 130 mg/dL — ABNORMAL HIGH (ref 70–99)
Glucose-Capillary: 130 mg/dL — ABNORMAL HIGH (ref 70–99)
Glucose-Capillary: 132 mg/dL — ABNORMAL HIGH (ref 70–99)
Glucose-Capillary: 138 mg/dL — ABNORMAL HIGH (ref 70–99)
Glucose-Capillary: 140 mg/dL — ABNORMAL HIGH (ref 70–99)
Glucose-Capillary: 150 mg/dL — ABNORMAL HIGH (ref 70–99)

## 2024-06-17 LAB — PHOSPHORUS: Phosphorus: 5.1 mg/dL — ABNORMAL HIGH (ref 2.5–4.6)

## 2024-06-17 LAB — CBC
HCT: 25.7 % — ABNORMAL LOW (ref 39.0–52.0)
Hemoglobin: 8.3 g/dL — ABNORMAL LOW (ref 13.0–17.0)
MCH: 29.6 pg (ref 26.0–34.0)
MCHC: 32.3 g/dL (ref 30.0–36.0)
MCV: 91.8 fL (ref 80.0–100.0)
Platelets: 344 K/uL (ref 150–400)
RBC: 2.8 MIL/uL — ABNORMAL LOW (ref 4.22–5.81)
RDW: 15.9 % — ABNORMAL HIGH (ref 11.5–15.5)
WBC: 24.7 K/uL — ABNORMAL HIGH (ref 4.0–10.5)
nRBC: 0 % (ref 0.0–0.2)

## 2024-06-17 LAB — TRIGLYCERIDES: Triglycerides: 171 mg/dL — ABNORMAL HIGH

## 2024-06-17 LAB — MAGNESIUM: Magnesium: 2.7 mg/dL — ABNORMAL HIGH (ref 1.7–2.4)

## 2024-06-17 NOTE — Progress Notes (Addendum)
 "  NAME:  Patrick [name], MRN:  [id number], DOB:  1980/08/25, LOS: 9 ADMISSION DATE:  06/07/2024 CONSULTATION DATE:  06/13/2024 REFERRING MD:  Vianne - TRH, CHIEF COMPLAINT:  Acute respiratory failure   History of Present Illness:  44 year old man who presented to [facility] 12/30 as a Code Stroke with L facial droop, L arm weakness and slurred speech. PMHx significant for arthritis, polysubstance abuse including IVDU.  On ED arrival, patient was altered with LKW ~0100 12/30. Unresponsive requiring BVM ventilations. Responsiveness improved with Narcan  administration, but ongoing facial asymmetry was noted. Labs were notable for WBC 20.1, Hgb 15.1, Plt 108. INR 1.3. Na 128, K 4.5, CO2 25, BUN/Cr 36/1.00. AST/ALT 144/71, Alk Phos 181, Tbili 0.9. Ethanol negative, ammonia 28. UDS +amphetamines, fentanyl . VBG pH 7.474/pCO2 36.6/bicarb 26.9. LA 2.5. PCT 3.05 BCID +MRSA. COVID/Flu/RSV negative. CT Head 12/30 showed intermediate-sized area of hypoattenuation within the R MCA territory c/w acute ischemic changes. CTA Head/Neck 12/31 negative for LVO, +atherosclerosis. CT Chest/A/P 12/31 showed multifocal PNA, diffuse bronchial wall thickening with moderate mucous impaction RLL, hepatosplenomegaly, R kidney atrophy, diverticulosis. MRI Brain demonstrated acute/subacute infarction at the R frontoparietal junction (R MCA territory) with mild HT, small areas of additional infarct/microhemorrhage. Ultimately admitted to [facility] service. TTE with concern for AV vegetation; ID was consulted for possible IE.  Cards was consulted for TEE.  On 1/5, Rapid Response was called for respiratory distress with tachypnea to 40s-50s, tachycardia to 120s, and SpO2 89% with increased WOB. PCCM was consulted and patient was transferred to ICU. Intubated on arrival to ICU.  Pertinent Medical History:   Past Medical History:  Diagnosis Date   Arthritis    IVDU (intravenous drug use)    MRSA bacteremia    Polysubstance abuse (HCC)     Significant Hospital Events: Including procedures, antibiotic start and stop dates in addition to other pertinent events   12/30 - Presented to ED as Code Stroke. 12/31 - TTE c/f AV vegetation/infective endocarditis, ID consulted, Cards consulted for TEE. 1/5 - Rapid Response for tachypnea, tachycardia, increased WOB. PCCM consulted. Transferred to ICU. Intubated. Levophed  initiated.    Interim History / Subjective:   Plan for comfort care tomorrow  Objective:  Blood pressure (!) 119/48, pulse 87, temperature (!) 100.6 F (38.1 C), temperature source Esophageal, resp. rate 19, height 5' 11 (1.803 m), weight 71.5 kg, SpO2 100%.    Vent Mode: PRVC FiO2 (%):  [40 %] 40 % Set Rate:  [20 bmp] 20 bmp Vt Set:  [620 mL] 620 mL PEEP:  [5 cmH20-8 cmH20] 8 cmH20 Plateau Pressure:  [17 cmH20-22 cmH20] 17 cmH20   Intake/Output Summary (Last 24 hours) at 06/17/2024 1330 Last data filed at 06/17/2024 1101 Gross per 24 hour  Intake 3836.74 ml  Output 2250 ml  Net 1586.74 ml   Filed Weights   06/09/24 0444 06/10/24 0417 06/17/24 0500  Weight: 74.3 kg 71.7 kg 71.5 kg    Physical Exam: General: Chronically ill-appearing, no acute distress HENT: [place], AT, ETT in place Eyes: EOMI, no scleral icterus Respiratory: Diminished to auscultation bilaterally.  No crackles, wheezing or rales Cardiovascular: RRR, -M/R/G, no JVD GI: BS+, soft, nontender Extremities:-Edema,-tenderness Neuro: Sedated, intermittently agitated   Resolved Hospital Problem List:    Assessment & Plan:   # Undifferentiated shock, presume septic in the setting of MRSA bacteremia with component of cardiogenic in the setting of infective endocarditis- new severe AR-not a surgical candidate # MRSA bacteremia, infective endocarditis- AV vegetations, new severe AR # Concern  for latent syphilis, ?neurosyphilis # Elevated LFTs, HCVAb+, anti-HBc+ # R MCA territory ischemic infarct # Acute hypoxic respiratory failure from  multifocal MRSA pneumonia # Hx of iv drug and polysubstance abuse - UDS +amphetamines/fentanyl  # 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 No escalation of care but continue current measures  Continue full vent settings Continue vasopressor support Continue labs Increase sedation ceiling on fentanyl  for comfort and RASS goal -3 and -4 Discontinue labs  PPI and DVT till tomorrow  Critical care time:    The patient is critically ill with multiple organ systems failure and requires high complexity decision making for assessment and support, frequent evaluation and titration of therapies, application of advanced monitoring technologies and extensive interpretation of multiple databases.  Independent Critical Care Time: 35 Minutes.   Slater Staff, M.D. [facility] [place] Pulmonary/Critical Care Medicine 06/17/2024 1:30 PM   Please see Amion for pager number to reach on-call Pulmonary and Critical Care Team.   "

## 2024-06-17 NOTE — Hospital Course (Signed)
 Hospital Course:  44 yo M w/ hx of IVDU admitted for septic shock 2/2 MRSA bacteremia +/- cardiogenic in setting of infective endocarditis w/ new severe AR. Acute hypoxic respiratory failure requiring intubation. Not a surgical candidate. Complicated w/ R MCA infarct. C/f neurosyphilis on PCN G. Discussion with family regarding poor prognosis and outcome. Family agreed to transition to comfort focused care. Patient died on *** at ***.

## 2024-06-17 NOTE — Progress Notes (Signed)
 Pharmacy Antibiotic Note  Patrick [name] is a 44 y.o. 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  Pharmacy has been consulted for vancomycin  dosing.   Vancomycin  kinetic parameters:  1/5: ke = 0.123, half life ~5.6 hours, Vd ~51.6 L 1/6: ke ~0.075  1/7: Ke ~0.0206, half life ~34 hours  Vancomycin  levels: 06/13/2024 vancomycin  trough (collected at 0834) = 10 mcg/mL 06/14/2024 vancomycin  random (collected at 0838) = 34 mcg/mL 06/15/2024 vancomycin  random (collected at 1420) = 35 mcg/mL  06/16/2024 vancomycin  random (collected at 0444) = 26 mcg/mL   Renal markers remain numerically unchanged and still elevated today (Scr 2.57>>2.51). No need to re-dose today as next vancomycin  dose is likely to be due ~48 hours from previous dose given yesterday (06/16/24 at 1322) depending on renal function trends. Upon chart review, noted that the tentative plans are for extubation tomorrow and comfort care.   Plan: No additional vancomycin  dose needed today Future doses will be guided based on renal function trend and vancomycin  levels as needed Trend WBC, temp, renal function, clinical progress, and goals of care   Height: 5' 11 (180.3 cm) Weight: 71.5 kg (157 lb 10.1 oz) IBW/kg (Calculated) : 75.3  Temp (24hrs), Avg:100.8 F (38.2 C), Min:90.3 F (32.4 C), Max:103.1 F (39.5 C)  Recent Labs  Lab 06/13/24 0200 06/13/24 0223 06/13/24 0834 06/14/24 0433 06/14/24 0838 06/15/24 1133 06/15/24 1420 06/16/24 0444 06/17/24 0500  WBC  --  26.4*  --  45.0*  --  37.3*  --  34.8* 24.7*  CREATININE  --  0.74  --  1.32*  --  2.39*  --  2.57* 2.51*  VANCOTROUGH  --  DISREGARD TEST RESULTS, TEST WILL BE CREDITED. TEST DRAWN BEFORE REQUESTED TIME. 10*  --   --   --   --   --   --   VANCOPEAK 42*  --   --   --   --   --   --   --   --   VANCORANDOM  --   --   --   --    < >  --  35 26  --    < > = values in this interval not displayed.     Estimated Creatinine Clearance: 38.8 mL/min (A) (by C-G formula based on SCr of 2.51 mg/dL (H)).    Allergies[1]  Medications this admission:  Cefepime  12/31 x1 dose Vanc 12/31 >> Penicillin  G 1/2 >> Levofloxacin  1/5 >> (1/9)   Microbiology: 12/31 bcx: 4/4 MRSA 12/31 RVP: neg 1/1 BCx: ngtd x5 days 1/6 Bcx: NGTD  Thank you for involving pharmacy in this patient's care.  Feliciano Close, PharmD PGY2 Infectious Diseases Pharmacy Resident        [1] No Known Allergies

## 2024-06-17 NOTE — Progress Notes (Signed)
 "                                                                                                                                                                                                          Daily Progress Note   Patient Name: Patrick [name]       Date: 06/17/2024 DOB: 1981-04-02  Age: 44 y.o. MRN#: [id number] Attending Physician: Kassie Acquanetta Bradley, MD Primary Care Physician: Patient, No Pcp Per Admit Date: 06/07/2024  Reason for Follow-up: Withdrawal of life-sustaining treatment  Patient Profile/HPI:  44 y.o. male with past medical history of arthritis, polysubstance abuse including IV drug use admitted on 06/07/2024 with presentation of altered mental status. Workup revealed R MCA stroke. There was suspicion for drug overdose and he received narcan , UDS from 12/30 reviewed and positive for fentanyl  and amphetamines. On initial presentation he was lethargic but able to answer questions with dysarthria. Additional workup revealed MRSA bacteremia and AV vegetation, and pneumonia, neurosyphilis, Hep B and Hep C. He had decline during admission and required intubation and initiation of IV pressor support on 1/5 for worsening ARDS. TEE on 1/6 revealed large AV vegetation and severe aortic regurgitation. Cardiothoracic surgery was consulted. He is not a candidate for surgery- comfort and hospice has been recommended. Palliative consulted to assist with complex medical decision making.   Subjective: Labs reviewed- Cr 2.51 today, WBC 24.07 today.   On evaluation patient continues to be intubated. Appears to be comfortable. Twin brother Patrick [name] is at bedside. Emotional support provided as Patrick [name] expressed he did not know how he would survive without his brother. He did not have any questions or needs.  Discussed with bedside RN- plan continues for extubation to comfort tomorrow.  Review of Systems  Unable to perform ROS: Intubated     Physical Exam Constitutional:      General: He is not in  acute distress.    Appearance: He is ill-appearing.  Cardiovascular:     Rate and Rhythm: Normal rate.     Pulses: Normal pulses.  Neurological:     Comments: sedated             Vital Signs: BP (!) 119/48 (BP Location: Left Arm)   Pulse 87   Temp (!) 100.6 F (38.1 C) (Esophageal)   Resp 19   Ht 5' 11 (1.803 m)   Wt 71.5 kg   SpO2 100%   BMI 21.98 kg/m  SpO2: SpO2: 100 % O2 Device: O2 Device: Ventilator O2 Flow Rate: O2 Flow Rate (L/min): 50 L/min  Intake/output summary:  Intake/Output Summary (Last 24 hours) at 06/17/2024 1220 Last data filed at 06/17/2024 1101 Gross per 24 hour  Intake 3977.73 ml  Output 2250 ml  Net 1727.73 ml   LBM: Last BM Date : 06/16/24 Baseline Weight: Weight: 79.2 kg Most recent weight: Weight: 71.5 kg       Palliative Assessment/Data: PPS: 20% (independently assessed and interpreted by me)      Patient Active Problem List   Diagnosis Date Noted   Protein-calorie malnutrition, severe 06/15/2024   Acute ischemic stroke (HCC) 06/13/2024   Sepsis (HCC) 06/13/2024   Acute respiratory failure with hypoxia (HCC) 06/13/2024   Pneumonia of both lungs due to infectious organism 06/13/2024   Opiate overdose (HCC) 06/13/2024   Neurosyphilis 06/10/2024   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   MRSA bacteremia 06/09/2024   Acute encephalopathy 06/08/2024    Palliative Care Assessment & Plan    Assessment/Recommendations/Plan  Multiple comorbidities - 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  I personally spent a total of 45 minutes in the care of the patient today including preparing to see the patient, getting/reviewing separately obtained history, performing a medically appropriate exam/evaluation, and documenting clinical information in the EHR.   Cassondra Stain, AGNP-C Palliative Medicine   Please contact Palliative Medicine Team phone at [phone] for questions and concerns.        "

## 2024-06-18 DIAGNOSIS — I639 Cerebral infarction, unspecified: Secondary | ICD-10-CM | POA: Diagnosis not present

## 2024-06-18 DIAGNOSIS — Z515 Encounter for palliative care: Secondary | ICD-10-CM | POA: Diagnosis not present

## 2024-06-18 DIAGNOSIS — N179 Acute kidney failure, unspecified: Secondary | ICD-10-CM | POA: Insufficient documentation

## 2024-06-18 DIAGNOSIS — R7881 Bacteremia: Secondary | ICD-10-CM | POA: Diagnosis not present

## 2024-06-18 DIAGNOSIS — G9340 Encephalopathy, unspecified: Secondary | ICD-10-CM | POA: Diagnosis not present

## 2024-06-18 LAB — GLUCOSE, CAPILLARY
Glucose-Capillary: 129 mg/dL — ABNORMAL HIGH (ref 70–99)
Glucose-Capillary: 126 mg/dL — ABNORMAL HIGH (ref 70–99)

## 2024-06-18 MED ORDER — MIDAZOLAM BOLUS VIA INFUSION: 2.0000 mg | INTRAVENOUS | Status: DC | PRN

## 2024-06-18 MED ORDER — HYDROMORPHONE HCL-NACL 50-0.9 MG/50ML-% IV SOLN
1.0000 mg/h | INTRAVENOUS | Status: DC
  Administered 2024-06-18: 4 mg/h via INTRAVENOUS
  Filled 2024-06-18: qty 50

## 2024-06-18 MED ORDER — MORPHINE BOLUS VIA INFUSION: 5.0000 mg | INTRAVENOUS | Status: DC | PRN

## 2024-06-18 MED ORDER — ACETAMINOPHEN 650 MG RE SUPP: 650.0000 mg | Freq: Four times a day (QID) | RECTAL | Status: DC | PRN

## 2024-06-18 MED ORDER — HYDROMORPHONE BOLUS VIA INFUSION: 1.0000 mg | INTRAVENOUS | Status: DC | PRN

## 2024-06-18 MED ORDER — GLYCOPYRROLATE 0.2 MG/ML IJ SOLN: 0.4000 mg | INTRAMUSCULAR | Status: DC

## 2024-06-18 MED ORDER — ACETAMINOPHEN 325 MG PO TABS: 650.0000 mg | ORAL_TABLET | Freq: Four times a day (QID) | ORAL | Status: DC | PRN

## 2024-06-18 MED ORDER — GLYCOPYRROLATE 0.2 MG/ML IJ SOLN
0.2000 mg | INTRAMUSCULAR | Status: DC | PRN
  Administered 2024-06-18: 0.2 mg via INTRAVENOUS
  Filled 2024-06-18: qty 1

## 2024-06-18 MED ORDER — HYDROMORPHONE BOLUS VIA INFUSION
1.0000 mg | INTRAVENOUS | Status: DC | PRN
  Administered 2024-06-18: 6 mg via INTRAVENOUS

## 2024-06-18 MED ORDER — GLYCOPYRROLATE 0.2 MG/ML IJ SOLN: 0.2000 mg | INTRAMUSCULAR | Status: DC | PRN

## 2024-06-18 MED ORDER — GLYCOPYRROLATE 1 MG PO TABS: 1.0000 mg | ORAL_TABLET | ORAL | Status: DC | PRN

## 2024-06-18 MED ORDER — POLYVINYL ALCOHOL 1.4 % OP SOLN: 1.0000 [drp] | Freq: Four times a day (QID) | OPHTHALMIC | Status: DC | PRN

## 2024-06-18 MED ORDER — MORPHINE 100MG IN NS 100ML (1MG/ML) PREMIX INFUSION: 0.0000 mg/h | INTRAVENOUS | Status: DC

## 2024-06-18 MED ORDER — SODIUM CHLORIDE 0.9 % IV SOLN: INTRAVENOUS | Status: DC

## 2024-06-19 LAB — CULTURE, BLOOD (ROUTINE X 2)
Culture: NO GROWTH
Culture: NO GROWTH
Special Requests: ADEQUATE
Special Requests: ADEQUATE

## 2024-07-10 NOTE — Death Summary Note (Signed)
 " DEATH SUMMARY   Patient Details  Name: Patrick [name] MRN: [id number] DOB: 1981-01-11  Admission/Discharge Information   Admit Date:  June 25, 2024  Date of Death: Date of Death: 07/06/24  Time of Death: Time of Death: 07-16-1539  Length of Stay: 07/06/24  Referring Physician: Patient, No Pcp Per   Reason(s) for Hospitalization  Stroke  Diagnoses  Preliminary cause of death: MRSA endocarditis Secondary Diagnoses (including complications and co-morbidities):  Principal Problem:   Acute encephalopathy Active Problems:   MRSA bacteremia   Neurosyphilis   Acute bacterial endocarditis   Acute ischemic right MCA stroke (HCC)   Septic shock (HCC)   Acute respiratory failure with hypoxia (HCC)   Pneumonia of both lungs due to infectious organism   Opiate overdose (HCC)   Protein-calorie malnutrition, severe   AKI (acute kidney injury)   Brief Hospital Course (including significant findings, care, treatment, and services provided and events leading to death)  Patrick [name] is a 44 yo M w/ hx of IVDU admitted for septic shock 2/2 MRSA bacteremia +/- cardiogenic in setting of infective endocarditis w/ new severe AR. Acute hypoxic respiratory failure requiring intubation. Not a surgical candidate. Complicated w/ R MCA infarct. C/f neurosyphilis on PCN G. Discussion with family regarding poor prognosis and outcome. Family meeting held including with HCPOA on telephone confirming plan to transition to comfort care. I spent >30 min time spent discussing patient's clinical course leading up to his current critical status and coordinating care. I addressed questions and concerns. Family agreed to transition to comfort focused care. Patient died on 07/06/2024 at 14:41.   Pertinent Labs and Studies  Significant Diagnostic Studies DG Chest Port 1 View Result Date: 06/15/2024 CLINICAL DATA:  Short of breath EXAM: PORTABLE CHEST 1 VIEW COMPARISON:  06/14/2024, 06/13/2024 FINDINGS: Enteric tube tip below the  diaphragm but incompletely assessed. Endotracheal tube tip about 4.7 cm superior to the carina. Left IJ central venous catheter tip at the SVC. Normal cardiac size. Worsening heterogeneous airspace disease in the lower lungs. No evidence for a pneumothorax IMPRESSION: Support lines and tubes as above. Worsening heterogeneous airspace disease in the lower lungs suspect for pneumonia. Electronically Signed   By: Luke Bun M.D.   On: 06/15/2024 18:29   ECHO TEE Result Date: 06/14/2024    TRANSESOPHOGEAL ECHO REPORT   Patient Name:   Patrick [name] Date of Exam: 06/14/2024 Medical Rec #:  [id number]     Height:       71.0 in Accession #:    [id number]    Weight:       158.1 lb Date of Birth:  29-Mar-1981     BSA:          1.908 m Patient Age:    43 years      BP:           121/52 mmHg Patient Gender: M             HR:           98 bpm. Exam Location:  Inpatient Procedure: Transesophageal Echo, Color Doppler and Cardiac Doppler (Both            Spectral and Color Flow Doppler were utilized during procedure). Indications:     Bacteremia  History:         Patient has prior history of Echocardiogram examinations, most                  recent 06/13/2024. Signs/Symptoms:Bacteremia.  Sonographer:  Merlynn Argyle Referring Phys:  [id number] ALINE FORBES DOOR Diagnosing Phys: Morene Brownie PROCEDURE: After discussion of the risks and benefits of a TEE, an informed consent was obtained from the patient. The transesophogeal probe was passed without difficulty through the esophogus of the patient. Sedation performed by different physician. The patient was monitored while under deep sedation. The patient developed no complications during the procedure.  IMPRESSIONS  1. Left ventricular ejection fraction, by estimation, is 60 to 65%. The left ventricle has normal function. The left ventricle has no regional wall motion abnormalities. Left ventricular diastolic function could not be evaluated.  2. Right ventricular systolic function is  normal. The right ventricular size is normal.  3. No left atrial/left atrial appendage thrombus was detected.  4. The mitral valve is grossly normal. Mild mitral valve regurgitation.  5. Moderate size aortic vegetation largely adherent to the noncoronary cusp with leaflet disctruction and severe AR. The aortic valve is tricuspid. Aortic valve regurgitation is severe.  6. 3D performed of the aortic valve and demonstrates Moderate size vegetation. FINDINGS  Left Ventricle: Left ventricular ejection fraction, by estimation, is 60 to 65%. The left ventricle has normal function. The left ventricle has no regional wall motion abnormalities. The left ventricular internal cavity size was normal in size. There is  no left ventricular hypertrophy. Left ventricular diastolic function could not be evaluated. Right Ventricle: The right ventricular size is normal. No increase in right ventricular wall thickness. Right ventricular systolic function is normal. Left Atrium: Left atrial size was normal in size. No left atrial/left atrial appendage thrombus was detected. Right Atrium: Right atrial size was normal in size. Pericardium: There is no evidence of pericardial effusion. Mitral Valve: The mitral valve is grossly normal. Mild mitral valve regurgitation. Tricuspid Valve: The tricuspid valve is normal in structure. Tricuspid valve regurgitation is mild. Aortic Valve: Moderate size aortic vegetation largely adherent to the noncoronary cusp with leaflet disctruction and severe AR. The aortic valve is tricuspid. Aortic valve regurgitation is severe. Pulmonic Valve: The pulmonic valve was grossly normal. Pulmonic valve regurgitation is not visualized. Aorta: The aortic root is normal in size and structure and the aortic root and ascending aorta are structurally normal, with no evidence of dilitation. IAS/Shunts: No atrial level shunt detected by color flow Doppler. Additional Comments: Spectral Doppler performed. Morene Brownie  Electronically signed by Morene Brownie Signature Date/Time: 06/14/2024/12:40:25 PM    Final    DG Chest Port 1 View Result Date: 06/14/2024 CLINICAL DATA:  Endotracheal tube placement. EXAM: PORTABLE CHEST 1 VIEW COMPARISON:  06/13/2024 FINDINGS: Endotracheal tube has tip 7.2 cm above the carina. Nasogastric tube courses into the region of the stomach and off the image as tip is not visualized. Left IJ central venous catheter unchanged with tip over the SVC. Lungs are adequately inflated with stable to slight worsening focal opacification over the right midlung possibly atelectasis or early infection. Stable minimal left basilar/retrocardiac opacification likely atelectasis and small amount left pleural fluid. Minimal prominence of the hilar regions improved. Cardiomediastinal silhouette and remainder of the exam is unchanged. IMPRESSION: 1. Stable to slight worsening focal opacification over the right midlung possibly atelectasis or early infection. Stable minimal left basilar/retrocardiac opacification likely atelectasis and small amount left pleural fluid. 2. Tubes and lines as described. Electronically Signed   By: Toribio Agreste M.D.   On: 06/14/2024 08:04   DG Chest 1 View Result Date: 06/13/2024 EXAM: 1 VIEW(S) XRAY OF THE CHEST 06/13/2024 04:41:00 PM COMPARISON: 06/13/2024 CLINICAL HISTORY:  Central line placement. FINDINGS: LINES, TUBES AND DEVICES: Endotracheal tube in place with tip 7.4 cm above the carina. Left internal jugular central venous catheter in place with tip in superior vena cava. Enteric tube in place with tip in stomach. LUNGS AND PLEURA: Mild pulmonary edema. Improved bilateral airspace disease. Small focal airspace disease persists in the lateral right mid lung. Small left pleural effusion. No pneumothorax. HEART AND MEDIASTINUM: No acute abnormality of the cardiac and mediastinal silhouettes. BONES AND SOFT TISSUES: No acute osseous abnormality. IMPRESSION: 1. Endotracheal tube tip 7.4  cm above the carina. 2. Left internal jugular central venous catheter tip in the superior vena cava. 3. Enteric tube tip in the stomach. 4. Mild pulmonary edema with improved bilateral airspace disease. 5. Small left pleural effusion. Electronically signed by: Greig Pique MD 06/13/2024 05:33 PM EST RP Workstation: [id number]   ECHOCARDIOGRAM LIMITED Result Date: 06/13/2024    ECHOCARDIOGRAM LIMITED REPORT   Patient Name:   ISAIAS DOWSON Date of Exam: 06/13/2024 Medical Rec #:  [id number]     Height:       71.0 in Accession #:    [id number]    Weight:       158.1 lb Date of Birth:  Aug 15, 1980     BSA:          1.908 m Patient Age:    43 years      BP:           85/49 mmHg Patient Gender: M             HR:           136 bpm. Exam Location:  Inpatient Procedure: Limited Echo, Cardiac Doppler and Color Doppler (Both Spectral and            Color Flow Doppler were utilized during procedure). Indications:    Endocarditis  History:        Patient has prior history of Echocardiogram examinations, most                 recent 06/08/2024. Stroke.  Sonographer:    Philomena Daring Referring Phys: [id number] TRUNG T VU IMPRESSIONS  1. Tachycardic 130's. Left ventricular ejection fraction, by estimation, is 60 to 65%. The left ventricle has normal function.  2. The mitral valve was not well visualized.  3. Small linear aortic vegetation noted, 1.4 x 0.6 cm. The aortic valve is abnormal. Conclusion(s)/Recommendation(s): Recommend TEE to further clarify valves. FINDINGS  Left Ventricle: Tachycardic 130's. Left ventricular ejection fraction, by estimation, is 60 to 65%. The left ventricle has normal function. Pericardium: There is no evidence of pericardial effusion. Mitral Valve: The mitral valve was not well visualized. Tricuspid Valve: The tricuspid valve is not well visualized. Aortic Valve: Small linear aortic vegetation noted, 1.4 x 0.6 cm. The aortic valve is abnormal. Additional Comments: Spectral Doppler performed. Color Doppler  performed.  IVC IVC diam: 1.80 cm Oneil Parchment MD Electronically signed by Oneil Parchment MD Signature Date/Time: 06/13/2024/4:48:35 PM    Final    DG Abd Portable 1V Result Date: 06/13/2024 CLINICAL DATA:  Orogastric tube placement EXAM: PORTABLE ABDOMEN - 1 VIEW COMPARISON:  CT 06/08/2024 FINDINGS: Enteric tube tip and side port overlie proximal stomach. Bilateral lower lung airspace disease is again noted. IMPRESSION: Enteric tube tip and side port overlie proximal stomach. Electronically Signed   By: Luke Bun M.D.   On: 06/13/2024 15:14   DG Chest Port 1 View Result Date: 06/13/2024 CLINICAL DATA:  Intubated EXAM:  PORTABLE CHEST 1 VIEW COMPARISON:  Prior chest x-ray 06/08/2019 FINDINGS: The patient is intubated. The tip of the endotracheal tube is 7.2 cm above the carina. Increased pulmonary vascular congestion without overt edema. New left mid and bilateral lower lobe patchy airspace opacities. Possible small left pleural effusion. IMPRESSION: Multifocal patchy airspace opacities including both mid lungs and both lower lobes consistent with multifocal pneumonia. Endotracheal tube is in good position 7.2 cm above the carina. Possible small left pleural effusion. Electronically Signed   By: Wilkie Lent M.D.   On: 06/13/2024 13:55   DG Swallowing Func-Speech Pathology Result Date: 06/10/2024 Table formatting from the original result was not included. Modified Barium Swallow Study Patient Details Name: Zechariah Bissonnette MRN: [id number] Date of Birth: 14-Feb-1981 Today's Date: 06/10/2024 HPI/PMH: HPI: 44 yo male presenting to ED 12/30 with L facial droop and L arm weakness. UDS positive for amphetamines and fentanyl . MRI shows acute/subacute infraction in the R MCA territory with possible small hemorrhagic transformation in addition to acute infarcts in the L cerebellum and L temporal lobe. Not a candidate for TNK or thrombectomy. Concern for endocarditis. CT C/A/P shows RLL posterior basal consolidation and  additional R upper and middle lobe peripheral consolidations, consistent with multifocal pneumonia. PMH includes polysubstance abuse, daily IV meth use. ST f/u for po readiness and dysarthria tx during acute stay; Pt currently NPO with MBS ordered to assess swallow function and determine safest diet. Clinical Impression: Clinical Impression: Pt presents with mild oropharyngeal dysphagia with cognitive impairment impacting swallow function intermittently.  Anterior interlabial escape with thin d/t L labial weakness, decreased oral preparation with thin escaping to the floor of the mouth.  Disorganized, repetitive mastication with solids and poor, limited dentition presence.  Swallow initiated at valleculae or  pyriform sinuses d/t impulsivity depending on volume size, but anterior hyoid movement and laryngeal elevation adequate.  Pt did exhibit trace penetration to level of the vocal cords with ejection (PAS 4) with subsequent swallows, but ultimately able to protect the airway with thin/nectar-thick liquids with various volumes.  Diminished pharyngeal stripping wave resulting in posterior pharyngeal wall residue which cleared with liquid wash and multiple cued swallows.  Decreased tongue base retraction resulting in narrow column of contrast on PPW, but cleared with compensatory strategies.  Esophageal retention with pill/puree in mid esophagus with liquid wash and repetitive swallows A with esophageal clearance.  Recommend initiate a conservative diet of Dysphagia 1(puree)/thin with FULL supervision/precautions in place during all po intake.  Medications provided crushed in puree for safety.  ST will f/u for diet progression/education and dysarthria tx during acute stay. Factors that may increase risk of adverse event in presence of aspiration Noe & Lianne 2021): Factors that may increase risk of adverse event in presence of aspiration Noe & Lianne 2021): Reduced cognitive function; Frail or  deconditioned; Aspiration of thick, dense, and/or acidic materials; Frequent aspiration of large volumes; Presence of tubes (ETT, trach, NG, etc.) DIGEST Swallow Severity Rating*  Safety: 2  Efficiency:1  Overall Pharyngeal Swallow Severity: mild-moderate 1: mild; 2: moderate; 3: severe; 4: profound *The Dynamic Imaging Grade of Swallowing Toxicity is standardized for the head and neck cancer population, however, demonstrates promising clinical applications across populations to standardize the clinical rating of pharyngeal swallow safety and severity. Recommendations/Plan: Swallowing Evaluation Recommendations Swallowing Evaluation Recommendations Recommendations: PO diet PO Diet Recommendation: Dysphagia 1 (Pureed); Thin liquids (Level 0) Liquid Administration via: Cup; Straw; Other (Comment) (small guided sips) Medication Administration: Crushed with puree Supervision: Full supervision/cueing for  swallowing strategies; Full assist for feeding Swallowing strategies  : Minimize environmental distractions; Slow rate; Small bites/sips; Multiple dry swallows after each bite/sip; Follow solids with liquids Postural changes: Position pt fully upright for meals Oral care recommendations: Oral care QID (4x/day) Treatment Plan Treatment Plan Treatment recommendations: Therapy as outlined in treatment plan below Follow-up recommendations: Acute inpatient rehab (3 hours/day) Functional status assessment: Patient has had a recent decline in their functional status and demonstrates the ability to make significant improvements in function in a reasonable and predictable amount of time. Treatment frequency: Min 2x/week Treatment duration: 1 week Interventions: Aspiration precaution training; Compensatory techniques; Patient/family education; Trials of upgraded texture/liquids; Diet toleration management by SLP Recommendations Recommendations for follow up therapy are one component of a multi-disciplinary discharge planning  process, led by the attending physician.  Recommendations may be updated based on patient status, additional functional criteria and insurance authorization. Assessment: Orofacial Exam: Orofacial Exam Oral Cavity - Dentition: Missing dentition; Poor condition Orofacial Anatomy: WFL Oral Motor/Sensory Function: Suspected cranial nerve impairment CN V - Trigeminal: Left sensory impairment Anatomy: Anatomy: WFL Boluses Administered: Boluses Administered Boluses Administered: Thin liquids (Level 0); Mildly thick liquids (Level 2, nectar thick); Moderately thick liquids (Level 3, honey thick); Puree; Solid  Oral Impairment Domain: Oral Impairment Domain Lip Closure: Interlabial escape, no progression to anterior lip Tongue control during bolus hold: Escape to lateral buccal cavity/floor of mouth Bolus preparation/mastication: Disorganized chewing/mashing with solid pieces of bolus unchewed; Slow prolonged chewing/mashing with complete recollection Bolus transport/lingual motion: Repetitive/disorganized tongue motion; Slow tongue motion Oral residue: Trace residue lining oral structures Location of oral residue : Tongue Initiation of pharyngeal swallow : Valleculae; Posterior angle of the ramus; Pyriform sinuses  Pharyngeal Impairment Domain: Pharyngeal Impairment Domain Soft palate elevation: No bolus between soft palate (SP)/pharyngeal wall (PW) Laryngeal elevation: Complete superior movement of thyroid cartilage with complete approximation of arytenoids to epiglottic petiole Anterior hyoid excursion: Complete anterior movement Epiglottic movement: Complete inversion Laryngeal vestibule closure: Complete, no air/contrast in laryngeal vestibule Pharyngeal stripping wave : Present - diminished Pharyngeal contraction (A/P view only): N/A Pharyngoesophageal segment opening: Complete distension and complete duration, no obstruction of flow Tongue base retraction: Narrow column of contrast or air between tongue base and PPW  Pharyngeal residue: Trace residue within or on pharyngeal structures Location of pharyngeal residue: Pharyngeal wall; Valleculae  Esophageal Impairment Domain: Esophageal Impairment Domain Esophageal clearance upright position: Esophageal retention Pill: Pill Consistency administered: Puree Puree: Impaired (see clinical impressions) Penetration/Aspiration Scale Score: Penetration/Aspiration Scale Score 1.  Material does not enter airway: Thin liquids (Level 0); Moderately thick liquids (Level 3, honey thick); Puree; Solid; Pill 2.  Material enters airway, remains ABOVE vocal cords then ejected out: Thin liquids (Level 0) 4.  Material enters airway, CONTACTS cords then ejected out: Thin liquids (Level 0) Compensatory Strategies: Compensatory Strategies Compensatory strategies: Yes Straw: Effective Effective Straw: Thin liquid (Level 0) Multiple swallows: Effective Effective Multiple Swallows: Thin liquid (Level 0); Puree; Solid Liquid wash: Effective Effective Liquid Wash: Puree; Pill   General Information: Caregiver present: No  Diet Prior to this Study: NPO   Temperature : Normal   Respiratory Status: WFL   Supplemental O2: None (Room air)   History of Recent Intubation: No  Behavior/Cognition: Cooperative; Impulsive; Distractible Self-Feeding Abilities: Able to self-feed; Needs assist with self-feeding; Other (Comment) (needs cues d/t cognitive impairment) Baseline vocal quality/speech: Dysphonic; Hypophonia/low volume Volitional Cough: Able to elicit Volitional Swallow: Able to elicit Exam Limitations: No limitations Goal Planning: Prognosis for improved  oropharyngeal function: Good Barriers to Reach Goals: Cognitive deficits No data recorded Patient/Family Stated Goal: Eat jello Consulted and agree with results and recommendations: Patient; Nurse; Physician Pain: Pain Assessment Pain Assessment: Faces Faces Pain Scale: 2 Breathing: 0 Negative Vocalization: 1 Facial Expression: 1 Body Language: 1 Consolability: 1  PAINAD Score: 4 Facial Expression: 0 Body Movements: 2 Muscle Tension: 0 Compliance with ventilator (intubated pts.): N/A Vocalization (extubated pts.): 1 CPOT Total: 3 Pain Location: generalized Pain Descriptors / Indicators: Discomfort; Grimacing Pain Intervention(s): Limited activity within patient's tolerance; Monitored during session; Repositioned End of Session: Start Time:SLP Start Time (ACUTE ONLY): 1222 Stop Time: SLP Stop Time (ACUTE ONLY): 1245 Time Calculation:SLP Time Calculation (min) (ACUTE ONLY): 23 min Charges: SLP Evaluations $ SLP Speech Visit: 1 Visit SLP Evaluations $MBS Swallow: 1 Procedure $Swallowing Treatment: 1 Procedure $Speech Treatment for Individual: 1 Procedure SLP visit diagnosis: SLP Visit Diagnosis: Dysphagia, oropharyngeal phase (R13.12) Past Medical History: Past Medical History: Diagnosis Date  Arthritis  Past Surgical History: No past surgical history on file. Pat Adams,M.S.,CCC-SLP 06/10/2024, 1:52 PM  ECHOCARDIOGRAM COMPLETE Result Date: 06/08/2024    ECHOCARDIOGRAM REPORT   Patient Name:   KARMELLO ABERCROMBIE Date of Exam: 06/08/2024 Medical Rec #:  [id number]     Height:       71.0 in Accession #:    [id number]    Weight:       174.6 lb Date of Birth:  1980-09-05     BSA:          1.990 m Patient Age:    43 years      BP:           117/81 mmHg Patient Gender: M             HR:           105 bpm. Exam Location:  Inpatient Procedure: 2D Echo, Cardiac Doppler, Color Doppler and Saline Contrast Bubble            Study (Both Spectral and Color Flow Doppler were utilized during            procedure). Indications:    Stroke I63.9  History:        Patient has no prior history of Echocardiogram examinations.  Sonographer:    Jayson Gaskins Referring Phys: [id number] ROCKY JAYSON LIKES IMPRESSIONS  1. Cannot R/O oscillating density on aortic valve; suggest TEE to further assess.  2. Left ventricular ejection fraction, by estimation, is 60 to 65%. The left ventricle has normal function. The left  ventricle has no regional wall motion abnormalities. Left ventricular diastolic parameters were normal.  3. Right ventricular systolic function is normal. The right ventricular size is normal.  4. The mitral valve is normal in structure. Trivial mitral valve regurgitation. No evidence of mitral stenosis.  5. The aortic valve is tricuspid. Aortic valve regurgitation is not visualized. No aortic stenosis is present.  6. The inferior vena cava is normal in size with greater than 50% respiratory variability, suggesting right atrial pressure of 3 mmHg.  7. Agitated saline contrast bubble study was negative, with no evidence of any interatrial shunt. Comparison(s): No prior Echocardiogram. FINDINGS  Left Ventricle: Left ventricular ejection fraction, by estimation, is 60 to 65%. The left ventricle has normal function. The left ventricle has no regional wall motion abnormalities. The left ventricular internal cavity size was normal in size. There is  no left ventricular hypertrophy. Left ventricular diastolic parameters were normal. Right Ventricle: The right ventricular size  is normal. Right ventricular systolic function is normal. Left Atrium: Left atrial size was normal in size. Right Atrium: Right atrial size was normal in size. Pericardium: There is no evidence of pericardial effusion. Mitral Valve: The mitral valve is normal in structure. Mild mitral annular calcification. Trivial mitral valve regurgitation. No evidence of mitral valve stenosis. Tricuspid Valve: The tricuspid valve is normal in structure. Tricuspid valve regurgitation is not demonstrated. No evidence of tricuspid stenosis. Aortic Valve: The aortic valve is tricuspid. Aortic valve regurgitation is not visualized. No aortic stenosis is present. Aortic valve mean gradient measures 4.0 mmHg. Aortic valve peak gradient measures 6.2 mmHg. Aortic valve area, by VTI measures 3.98 cm. Pulmonic Valve: The pulmonic valve was not well visualized. Pulmonic valve  regurgitation is not visualized. No evidence of pulmonic stenosis. Aorta: The aortic root is normal in size and structure. Venous: The inferior vena cava is normal in size with greater than 50% respiratory variability, suggesting right atrial pressure of 3 mmHg. IAS/Shunts: No atrial level shunt detected by color flow Doppler. Agitated saline contrast was given intravenously to evaluate for intracardiac shunting. Agitated saline contrast bubble study was negative, with no evidence of any interatrial shunt. Additional Comments: Cannot R/O oscillating density on aortic valve; suggest TEE to further assess.  LEFT VENTRICLE PLAX 2D LVIDd:         5.00 cm   Diastology LVIDs:         3.50 cm   LV e' lateral: 17.20 cm/s LV PW:         0.90 cm LV IVS:        0.80 cm LVOT diam:     2.07 cm LV SV:         72 LV SV Index:   36 LVOT Area:     3.37 cm  RIGHT VENTRICLE RV S prime:     13.90 cm/s TAPSE (M-mode): 2.5 cm LEFT ATRIUM             Index        RIGHT ATRIUM           Index LA Vol (A2C):   53.3 ml 26.78 ml/m  RA Area:     15.40 cm LA Vol (A4C):   26.6 ml 13.36 ml/m  RA Volume:   34.90 ml  17.53 ml/m LA Biplane Vol: 37.8 ml 18.99 ml/m  AORTIC VALVE AV Area (Vmax):    3.23 cm AV Area (Vmean):   3.10 cm AV Area (VTI):     3.98 cm AV Vmax:           125.00 cm/s AV Vmean:          90.600 cm/s AV VTI:            0.180 m AV Peak Grad:      6.2 mmHg AV Mean Grad:      4.0 mmHg LVOT Vmax:         120.00 cm/s LVOT Vmean:        83.400 cm/s LVOT VTI:          0.213 m LVOT/AV VTI ratio: 1.18  SHUNTS Systemic VTI:  0.21 m Systemic Diam: 2.07 cm Redell Shallow MD Electronically signed by Redell Shallow MD Signature Date/Time: 06/08/2024/12:45:38 PM    Final    MR BRAIN WO CONTRAST Result Date: 06/08/2024 EXAM: MRI BRAIN WITHOUT CONTRAST 06/08/2024 12:13:07 PM TECHNIQUE: Multiplanar multisequence MRI of the head/brain was performed without the administration of intravenous contrast. COMPARISON: CT of  the head dated  06/07/2024. CLINICAL HISTORY: Neuro deficit, acute, stroke suspected. FINDINGS: BRAIN AND VENTRICLES: Restricted diffusion involving the right frontoparietal junction and insular region, corresponding with the area of diminished attenuation on prior head CT, compatible with acute/subacute nonhemorrhagic infarction. There is also a linear area of restricted diffusion present posteromedially within the left cerebellar hemisphere, also compatible with acute nonhemorrhagic infarct. There are additional foci of restricted diffusion within the left temporal lobe, compatible with small acute lacunar infarcts. An additional lesion is present anteromedially within the right frontal lobe subcortical white matter. There is hemosiderin staining involving the right MCA distribution infarct, likely representing petechial oozing. There is also blooming artifact present posteromedially within the right occipital lobe. No mass. No midline shift. No hydrocephalus. The sella is unremarkable. Normal flow voids. ORBITS: No acute abnormality. SINUSES AND MASTOIDS: Mucosal disease present within the ethmoid and maxillary sinuses. BONES AND SOFT TISSUES: Normal marrow signal. No acute soft tissue abnormality. IMPRESSION: 1. Acute/subacute infarction at the right frontoparietal junction/insular region (right MCA territory) with associated hemosiderin staining, most compatible with mild hemorrhagic transformation/oozing. 2. Acute infarct in the left cerebellar hemisphere. 3. Small acute lacunar infarcts in the left temporal lobe. 4. Additional focus in the anteromedial right frontal subcortical white matter, compatible with an additional acute infarct. 5. Blooming artifact in the posteromedial right occipital lobe, most consistent with a small chronic hemorrhagic focus (e.g., microhemorrhage). 6. Mucosal disease in the ethmoid and maxillary sinuses. Electronically signed by: Evalene Coho MD 06/08/2024 12:38 PM EST RP Workstation:  [id number]   CT CHEST ABDOMEN PELVIS WO CONTRAST Result Date: 06/08/2024 EXAM: CT CHEST, ABDOMEN AND PELVIS WITHOUT CONTRAST 06/08/2024 02:52:24 AM TECHNIQUE: CT of the chest, abdomen and pelvis was performed without the administration of intravenous contrast. Multiplanar reformatted images are provided for review. Automated exposure control, iterative reconstruction, and/or weight based adjustment of the mA/kV was utilized to reduce the radiation dose to as low as reasonably achievable. COMPARISON: Comparison is made to prior examination of 06/26/2020. CLINICAL HISTORY: Sepsis. FINDINGS: CHEST: MEDIASTINUM AND LYMPH NODES: Heart and pericardium are unremarkable. Mild coronary artery calcification. The central airways are clear. No mediastinal, hilar or axillary lymphadenopathy. LUNGS AND PLEURA: Diffuse bronchial wall thickening noted in keeping with airway inflammation. Right posterior basal pulmonary consolidation suspicious for acute lobar pneumonia in the appropriate clinical setting. Additional pleural based areas of reverse consolidation are identified within the peripheral right upper lobe and right middle lobe which can be seen in the setting of acute infection including atypical infection such as invasive fungal infection of the immunocompromised individual, septic embolization, or resolving pulmonary infarcts. In the acute setting, however, this is still most likely altogether reflective of multifocal bronchopneumonia. Correlation with the patient's immunocompetency status is warranted, however. Moderate airway impaction noted within the posterior base of segmental bronchi of the right lower lobe. Subpleural pulmonary nodule within the left lower lobe is stable since remote prior examination and is safely considered benign. Additional pulmonary nodules within the right upper lobe measuring up to 5 mm. Noncalcified pulmonary nodule within the left upper lobe (series 67, image 4) is indeterminate. Per  Fleischner Society Guidelines recommend a non-contrast chest CT at 3-6 months, then consider another non-contrast chest CT at 18-24 months. If patient is low risk for malignancy, non-contrast chest CT at 18-24 months is optional. No pulmonary edema. No pleural effusion. No pneumothorax. ABDOMEN AND PELVIS: LIVER: Hepatomegaly, liver measuring 23 cm in craniocaudal dimension, appears new from prior examination. GALLBLADDER AND BILE DUCTS: Unremarkable.  No biliary ductal dilatation. SPLEEN: Splenomegaly, spleen measuring 50.4 cm in greatest dimension, appears new from prior examination. PANCREAS: No acute abnormality. ADRENAL GLANDS: No acute abnormality. KIDNEYS, URETERS AND BLADDER: Marked atrophy of the right kidney and compensatory hypertrophy of the left kidney are again noted. Residual contrast from recent contrast enhanced examination is seen within the renal collecting system. The urinary bladder is moderately distended but is otherwise unremarkable. No stones in the kidneys or ureters. No hydronephrosis. No perinephric or periureteral stranding. GI AND BOWEL: Stomach demonstrates no acute abnormality. Moderate sigmoid diverticulosis. No superimposed acute inflammatory change. The small bowel and large bowel are otherwise unremarkable. There is no bowel obstruction. REPRODUCTIVE ORGANS: Moderate right scrotal hydrocele was partially visualized. PERITONEUM AND RETROPERITONEUM: No ascites. No free air. VASCULATURE: Aorta is normal in caliber. Mild aortoiliac atherosclerotic calcification. No aortic aneurysm. ABDOMINAL AND PELVIS LYMPH NODES: No lymphadenopathy. BONES AND SOFT TISSUES: No acute osseous abnormality. No focal soft tissue abnormality. IMPRESSION: 1. Right lower lobe posterior basal consolidation and additional right upper and middle lobe peripheral consolidations, most consistent with multifocal pneumonia. Additional considerations regarding the scattered areas of peripheral consolidation within the  right upper and right middle lobe are as described above, and correlation with the patient's immunocompetency status is warranted. 2. Diffuse bronchial wall thickening with moderate mucus impaction in right lower lobe segmental bronchi, consistent with airway inflammation. 3. Hepatosplenomegaly, new from prior examination. 4. Indeterminate noncalcified left upper lobe pulmonary nodule, with recommendation for non-contrast chest CT at 3-6 months and then consideration of non-contrast chest CT at 18-24 months as per Fleischner Society Guidelines. 5. Marked atrophy of the right kidney with compensatory hypertrophy of the left kidney. 6. Moderate sigmoid diverticulosis without acute inflammatory change. 7. Mild coronary artery calcification and mild aortoiliac atherosclerotic calcification. 8. Partially visualized moderate right scrotal hydrocele. Electronically signed by: Dorethia Molt MD 06/08/2024 03:46 AM EST RP Workstation: [id number]   DG Chest Portable 1 View Result Date: 06/08/2024 EXAM: 1 VIEW(S) XRAY OF THE CHEST 06/07/2024 11:44:39 PM COMPARISON: None available. CLINICAL HISTORY: eval for AMS FINDINGS: LUNGS AND PLEURA: Hypoinflated lungs. Bibasilar opacities, likely atelectasis. No pleural effusion. No pneumothorax. HEART AND MEDIASTINUM: No acute abnormality of the cardiac and mediastinal silhouettes. BONES AND SOFT TISSUES: No acute osseous abnormality. IMPRESSION: 1. Hypoinflated lungs with bibasilar opacities, likely atelectasis. Electronically signed by: Franky Stanford MD 06/08/2024 01:00 AM EST RP Workstation: [id number]   CT ANGIO HEAD NECK W WO CM Result Date: 06/08/2024 EXAM: CTA HEAD AND NECK WITHOUT AND WITH 06/08/2024 12:39:44 AM TECHNIQUE: CTA of the head and neck was performed without and with the administration of 75 mL of intravenous iohexol  (OMNIPAQUE ) 350 MG/ML injection. Multiplanar 2D and/or 3D reformatted images are provided for review. Automated exposure control, iterative  reconstruction, and/or weight based adjustment of the mA/kV was utilized to reduce the radiation dose to as low as reasonably achievable. Stenosis of the internal carotid arteries measured using NASCET criteria. COMPARISON: None available CLINICAL HISTORY: Neuro deficit, acute, stroke suspected. FINDINGS: CTA NECK: AORTIC ARCH AND ARCH VESSELS: No dissection or arterial injury. No significant stenosis of the brachiocephalic or subclavian arteries. CERVICAL CAROTID ARTERIES: Atherosclerosis at the carotid bifurcations without hemodynamically significant stenosis. No dissection or arterial injury. CERVICAL VERTEBRAL ARTERIES: No dissection, arterial injury, or significant stenosis. LUNGS AND MEDIASTINUM: Unremarkable. SOFT TISSUES: No acute abnormality. BONES: No acute abnormality. CTA HEAD: ANTERIOR CIRCULATION: No significant stenosis of the internal carotid arteries. No significant stenosis of the anterior cerebral arteries. No significant stenosis  of the middle cerebral arteries. No aneurysm. POSTERIOR CIRCULATION: Fetal predominant origins of both posterior cerebral arteries. No significant stenosis of the posterior cerebral arteries. No significant stenosis of the basilar artery. No significant stenosis of the vertebral arteries. No aneurysm. OTHER: No dural venous sinus thrombosis on this non-dedicated study. Unchanged appearance of hypoattenuating ischemic region in the right MCA territory. IMPRESSION: 1. No large vessel occlusion, hemodynamically significant stenosis, or aneurysm in the head or neck. 2. Atherosclerosis at the carotid bifurcations without hemodynamically significant stenosis of the internal carotid arteries. Electronically signed by: Franky Stanford MD 06/08/2024 12:57 AM EST RP Workstation: [id number]   CT HEAD CODE STROKE WO CONTRAST Result Date: 06/07/2024 EXAM: CT HEAD WITHOUT CONTRAST 06/07/2024 11:16:45 PM TECHNIQUE: CT of the head was performed without the administration of intravenous  contrast. Automated exposure control, iterative reconstruction, and/or weight based adjustment of the mA/kV was utilized to reduce the radiation dose to as low as reasonably achievable. COMPARISON: None available. CLINICAL HISTORY: Neuro deficit, acute, stroke suspected. FINDINGS: BRAIN AND VENTRICLES: No acute hemorrhage. There is an intermediate-sized area of hypoattenuation within the right MCA territory affecting the insula and m2/m5 cortex. [place] Stroke Program Early CT Score (ASPECTS): Ganglionic (caudate, ic, lentiform nucleus, insula, M1-m3): 5. Supraganglionic (m4-m6): 2. Total: 8. No hydrocephalus. No extra-axial collection. No mass effect or midline shift. ORBITS: No acute abnormality. SINUSES: No acute abnormality. SOFT TISSUES AND SKULL: No acute soft tissue abnormality. No skull fracture. IMPRESSION: 1. Intermediate-sized area of hypoattenuation within the right MCA territory affecting the insula and M2/M5 cortex, consistent with acute ischemic changes. 2. ASPECTS score is 7. Findings communicated to Dr. Salman Khaliqdina at 11:23 PM on 06/07/2024. Electronically signed by: Franky Stanford MD 06/07/2024 11:24 PM EST RP Workstation: [id number]    Microbiology Recent Results (from the past 240 hours)  Culture, blood (Routine X 2) w Reflex to ID Panel     Status: None   Collection Time: 06/09/24  5:36 AM   Specimen: BLOOD  Result Value Ref Range Status   Specimen Description BLOOD SITE NOT SPECIFIED  Final   Special Requests   Final    BOTTLES DRAWN AEROBIC AND ANAEROBIC Blood Culture results may not be optimal due to an inadequate volume of blood received in culture bottles   Culture   Final    NO GROWTH 5 DAYS Performed at [facility], [address], [place], [place] [address]    Report Status 06/14/2024 FINAL  Final  Culture, blood (Routine X 2) w Reflex to ID Panel     Status: None   Collection Time: 06/09/24  5:36 AM   Specimen: BLOOD  Result Value Ref Range Status    Specimen Description BLOOD SITE NOT SPECIFIED  Final   Special Requests   Final    BOTTLES DRAWN AEROBIC AND ANAEROBIC Blood Culture adequate volume   Culture   Final    NO GROWTH 5 DAYS Performed at [facility], [address], [place], [place] [address]    Report Status 06/14/2024 FINAL  Final  MRSA Next Gen by PCR, Nasal     Status: Abnormal   Collection Time: 06/13/24  3:53 PM   Specimen: Nasal Mucosa; Nasal Swab  Result Value Ref Range Status   MRSA by PCR Next Gen DETECTED (A) NOT DETECTED Final    Comment: RESULT CALLED TO, READ BACK BY AND VERIFIED WITH: RN WENDI DONOVAN [phone] AT 2019, ADC (NOTE) The GeneXpert MRSA Assay (FDA approved for NASAL  specimens only), is one component of a comprehensive MRSA colonization surveillance program. It is not intended to diagnose MRSA infection nor to guide or monitor treatment for MRSA infections. Test performance is not FDA approved in patients less than 55 years old. Performed at [facility], [address] [place], [place] [address]   Culture, blood (Routine X 2) w Reflex to ID Panel     Status: None (Preliminary result)   Collection Time: 06/14/24 11:47 AM   Specimen: BLOOD RIGHT HAND  Result Value Ref Range Status   Specimen Description BLOOD RIGHT HAND  Final   Special Requests   Final    BOTTLES DRAWN AEROBIC AND ANAEROBIC Blood Culture adequate volume   Culture   Final    NO GROWTH 4 DAYS Performed at [facility], [address] [place], [place] [address]    Report Status PENDING  Incomplete  Culture, blood (Routine X 2) w Reflex to ID Panel     Status: None (Preliminary result)   Collection Time: 06/14/24  2:09 PM   Specimen: BLOOD LEFT HAND  Result Value Ref Range Status   Specimen Description BLOOD LEFT HAND  Final   Special Requests   Final    BOTTLES DRAWN AEROBIC AND ANAEROBIC Blood Culture adequate volume   Culture   Final    NO GROWTH 4 DAYS Performed at [facility], [address] [place], [place] [address]    Report Status PENDING  Incomplete    Lab Basic Metabolic Panel: Recent Labs  Lab 06/13/24 0223 06/13/24 1547 06/14/24 0433 06/14/24 1215 06/14/24 1524 06/15/24 0500 06/15/24 1133 06/15/24 1137 06/16/24 0444 06/16/24 1819 06/17/24 0500  NA 137   < > 136   < >  --   --  133* 136 133* 138 133*  K 4.0   < > 3.9   < >  --   --  4.9 4.8 5.0 5.2* 5.5*  CL 108  --  106  --   --   --  105  --  106  --  105  CO2 16*  --  18*  --   --   --  17*  --  17*  --  19*  GLUCOSE 127*  --  145*  --   --   --  145*  --  154*  --  143*  BUN 29*  --  45*  --   --   --  58*  --  67*  --  72*  CREATININE 0.74  --  1.32*  --   --   --  2.39*  --  2.57*  --  2.51*  CALCIUM 7.8*  --  7.8*  --   --   --  8.0*  --  8.0*  --  7.8*  MG 2.3  --  2.4  --   --  2.4  --   --  2.8*  --  2.7*  PHOS 3.6  --   --   --  3.7 4.1  --   --  3.9  --  5.1*   < > = values in this interval not displayed.   Liver Function Tests: Recent Labs  Lab 06/12/24 0616 06/13/24 0223 06/14/24 0433  AST 58* 68* 45*  ALT 44 52* 44  ALKPHOS 148* 118 99  BILITOT 1.4* 1.1 0.9  PROT 7.8 7.8 7.4  ALBUMIN 2.7* 2.7* 2.7*   No results for input(s): LIPASE,  AMYLASE in the last 168 hours. No results for input(s): AMMONIA in the last 168 hours. CBC: Recent Labs  Lab 06/13/24 0223 06/13/24 1547 06/14/24 0433 06/14/24 1215 06/15/24 1133 06/15/24 1137 06/16/24 0444 06/16/24 1819 06/17/24 0500  WBC 26.4*  --  45.0*  --  37.3*  --  34.8*  --  24.7*  HGB 11.4*   < > 10.7*   < > 9.3* 9.5* 9.1* 9.2* 8.3*  HCT 34.4*   < > 32.1*   < > 27.7* 28.0* 27.3* 27.0* 25.7*  MCV 88.4  --  89.4  --  89.4  --  89.5  --  91.8  PLT 137*  --  248  --  208  --  274  --  344   < > = values in this interval not displayed.   Cardiac Enzymes: No results for input(s): CKTOTAL, CKMB, CKMBINDEX, TROPONINI in the last 168 hours. Sepsis Labs: Recent Labs  Lab 06/14/24 0433 06/15/24 1133 06/16/24 0444  06/17/24 0500  WBC 45.0* 37.3* 34.8* 24.7*       Merel Santoli Slater Staff 07/10/24, 3:47 PM   "

## 2024-07-10 NOTE — Procedures (Signed)
 Extubation Procedure Note  Patient Details:   Name: Patrick [name] DOB: February 20, 1981 MRN: [id number]   Airway Documentation:    Vent end date: 07-10-24 Vent end time: 1315   Evaluation  O2 sats: currently acceptable Complications: No apparent complications Patient did tolerate procedure well. Bilateral Breath Sounds: Clear, Diminished   No Pt extubated to comfort care per MD order. Positive cuff leak noted Pamla Pangle V 2024/07/10, 1:16 PM

## 2024-07-10 NOTE — Progress Notes (Signed)
 Patient expired at 15:41 with son at bedside. Patient pronounced by myself and Pearson Fiedler. Dr Kassie notified. Versed  and Dilaudid  wasted in pyxis. Honor bridge notified by [employer] and Adult Nurse notified.

## 2024-07-10 NOTE — Progress Notes (Signed)
 This RN accessed this patient's chart to answer a family phone call by the daughter, Patrick [name], regarding the patient's prognosis.  Patrick [name]

## 2024-07-10 NOTE — Progress Notes (Signed)
 .km                                                                                                                                                                                                          Daily Progress Note   Patient Name: Patrick [name]       Date: 2024-06-29 DOB: 1981-04-12  Age: 44 y.o. MRN#: [id number] Attending Physician: Kassie Acquanetta Bradley, MD Primary Care Physician: Patient, No Pcp Per Admit Date: 06/07/2024  Reason for Follow-up: Withdrawal of life-sustaining treatment  Patient Profile/HPI:  44 y.o. male with past medical history of arthritis, polysubstance abuse including IV drug use admitted on 06/07/2024 with presentation of altered mental status. Workup revealed R MCA stroke. There was suspicion for drug overdose and he received narcan , UDS from 12/30 reviewed and positive for fentanyl  and amphetamines. On initial presentation he was lethargic but able to answer questions with dysarthria. Additional workup revealed MRSA bacteremia and AV vegetation, and pneumonia, neurosyphilis, Hep B and Hep C. He had decline during admission and required intubation and initiation of IV pressor support on 1/5 for worsening ARDS. TEE on 1/6 revealed large AV vegetation and severe aortic regurgitation. Cardiothoracic surgery was consulted. He is not a candidate for surgery- comfort and hospice has been recommended. Palliative consulted to assist with complex medical decision making.  1/7- initial Palliative consult- family made decision for extubation to comfort, requested to wait until 1/8 1/8- family requested to wait until Jun 29, 2024 for extubation   Discussion: Discussed with Dr. Kassie earlier this morning. Patient's brother left bedside and requested extubation to be done upon his return. Dr. Kassie entered extubation and comfort orders.  I returned to bedside after patient had been extubated.  Patient appeared very uncomfortable with RR in mid-upper 40's. His brother at bedside was  distressed and called other family members reporting he was gasping.  I remained at the bedside and worked with RN, increased hourly infusion rate ceilings and increased bolus doses in efforts to provide more comfort for patient.  Discussed with family that patient's breathing pattern is likely neurogenic related to brain injury from strokes and the fact he may be continuing to throw septic emboli causing more damage. However, he appears comfortable otherwise. He is deeply sedated on multiple sedating medications. He is unresponsive to any stimulation.  Emotional support provided to his twin brother who was at the bedside as he shared stories of his and his brothers life journey.   Review of Systems  Unable to perform ROS: Intubated     Physical Exam Constitutional:  General: He is not in acute distress.    Appearance: He is ill-appearing.  Cardiovascular:     Rate and Rhythm: Normal rate.     Pulses: Normal pulses.  Pulmonary:     Comments: Terminal secretions, increased RR  Neurological:     Comments: unresponsive             Vital Signs: BP (!) 119/45   Pulse 89   Temp (!) 101.1 F (38.4 C)   Resp (!) 32   Ht 5' 11 (1.803 m)   Wt 71.5 kg   SpO2 98%   BMI 21.98 kg/m  SpO2: SpO2: 98 % O2 Device: O2 Device: Ventilator O2 Flow Rate: O2 Flow Rate (L/min): 50 L/min  Intake/output summary:  Intake/Output Summary (Last 24 hours) at 2024-07-04 1349 Last data filed at 07/04/24 1100 Gross per 24 hour  Intake 4146.61 ml  Output 2020 ml  Net 2126.61 ml   LBM: Last BM Date : 06/17/24 Baseline Weight: Weight: 79.2 kg Most recent weight: Weight: 71.5 kg       Palliative Assessment/Data: PPS: 10% (independently assessed and interpreted by me)      Patient Active Problem List   Diagnosis Date Noted   Protein-calorie malnutrition, severe 06/15/2024   Acute ischemic stroke (HCC) 06/13/2024   Sepsis (HCC) 06/13/2024   Acute respiratory failure with hypoxia (HCC)  06/13/2024   Pneumonia of both lungs due to infectious organism 06/13/2024   Opiate overdose (HCC) 06/13/2024   Neurosyphilis 06/10/2024   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   MRSA bacteremia 06/09/2024   Acute encephalopathy 06/08/2024    Palliative Care Assessment & Plan    Assessment/Recommendations/Plan  Multiple comorbidities - 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, large stroke due to septic emboli, MRSA bacteremia, nuerosyphilis- has been extubated to comfort measures Increased propofol  ceiling to 129mcg/kg/hr Increased hydromorphone  infusion ceiling to 10mg /hr for comfort Increased hydromorphone  bolus range to 1-10mg  q15 min- give bolus dose to match hourly infusion rate for comfort Increased versed  bolus dose ceiling to 30mg  - give bolus dose to match hourly infusion dose q15 min prn for comfort   Code Status:   Code Status: Do not attempt resuscitation (DNR) - Comfort care   Prognosis:  Hours - Days  Discharge Planning: Anticipated Hospital Death    Thank you for allowing the Palliative Medicine Team to assist in the care of this patient.  I personally spent a total of 90 minutes in the care of the patient today including preparing to see the patient, getting/reviewing separately obtained history, performing a medically appropriate exam/evaluation, counseling and educating, placing orders, referring and communicating with other health care professionals, documenting clinical information in the EHR, and remaining at the bedside performing assessments of patient's comfort and titrating medications.   Cassondra Stain, AGNP-C Palliative Medicine   Please contact Palliative Medicine Team phone at [phone] for questions and concerns.

## 2024-07-10 NOTE — IPAL (Signed)
" °  Interdisciplinary Goals of Care Family Meeting   Date carried out: 07/09/24  Location of the meeting: Bedside  Member's involved: Physician, Bedside Registered Nurse, and Family Member or next of kin  Durable Power of Attorney or acting medical decision maker: Judeth, daughter    Discussion: We discussed goals of care for [employer] .  Confirmed with daughter to transition to comfort care today after family has arrived at bedside. Judeth does not wish to be present and has requested Randine to call her after patient has passed. I spoke with family at bedside and decision made for compassionate extubation after lunch. We discussed steps to transition and how we would focus on patient comfort. Family agreed and ready to start.  Code status:   Code Status: Do not attempt resuscitation (DNR) - Comfort care   Disposition: In-patient comfort care  Time spent for the meeting: 30 min    Draven Laine Slater Staff, MD  09-Jul-2024, 1:26 PM   "

## 2024-07-10 DEATH — deceased
